# Patient Record
Sex: Female | Born: 1955 | Race: White | Hispanic: No | Marital: Single | Smoking: Former smoker
Health system: Southern US, Community
[De-identification: ages and names within clinical notes are randomized; demographics above are authoritative.]

## PROBLEM LIST (undated history)

## (undated) ENCOUNTER — Emergency Department: Discharge status: Absent without leave | Payer: Medicare Other

## (undated) DIAGNOSIS — I509 Heart failure, unspecified: Secondary | ICD-10-CM

## (undated) DIAGNOSIS — I10 Essential (primary) hypertension: Secondary | ICD-10-CM

## (undated) DIAGNOSIS — E119 Type 2 diabetes mellitus without complications: Secondary | ICD-10-CM

## (undated) DIAGNOSIS — R600 Localized edema: Secondary | ICD-10-CM

## (undated) DIAGNOSIS — E785 Hyperlipidemia, unspecified: Secondary | ICD-10-CM

## (undated) DIAGNOSIS — R0602 Shortness of breath: Secondary | ICD-10-CM

## (undated) DIAGNOSIS — I2510 Atherosclerotic heart disease of native coronary artery without angina pectoris: Secondary | ICD-10-CM

## (undated) HISTORY — PX: CERVICAL FUSION: SHX112

## (undated) HISTORY — PX: CORONARY ANGIOPLASTY WITH STENT PLACEMENT: SHX49

## (undated) HISTORY — DX: Localized edema: R60.0

## (undated) HISTORY — DX: Shortness of breath: R06.02

## (undated) HISTORY — PX: APPENDECTOMY: SHX54

## (undated) HISTORY — PX: HERNIA REPAIR: SHX51

## (undated) HISTORY — DX: Atherosclerotic heart disease of native coronary artery without angina pectoris: I25.10

---

## 2005-09-22 ENCOUNTER — Emergency Department: Payer: Self-pay | Admitting: Emergency Medicine

## 2006-03-02 ENCOUNTER — Ambulatory Visit: Payer: Self-pay | Admitting: Cardiovascular Disease

## 2008-05-26 ENCOUNTER — Emergency Department: Payer: Self-pay | Admitting: Emergency Medicine

## 2009-06-17 ENCOUNTER — Emergency Department: Payer: Self-pay | Admitting: Emergency Medicine

## 2010-04-19 ENCOUNTER — Emergency Department: Payer: Self-pay | Admitting: Emergency Medicine

## 2010-09-23 ENCOUNTER — Emergency Department: Payer: Self-pay | Admitting: Emergency Medicine

## 2011-07-17 ENCOUNTER — Inpatient Hospital Stay: Payer: Self-pay | Admitting: Internal Medicine

## 2011-08-07 ENCOUNTER — Ambulatory Visit: Payer: Self-pay

## 2012-02-22 ENCOUNTER — Emergency Department: Payer: Self-pay | Admitting: Emergency Medicine

## 2019-09-03 ENCOUNTER — Other Ambulatory Visit: Payer: Self-pay

## 2019-09-03 ENCOUNTER — Emergency Department
Admission: EM | Admit: 2019-09-03 | Discharge: 2019-09-03 | Disposition: A | Discharge status: Home / Self Care | Payer: Medicaid Other | Attending: Emergency Medicine | Admitting: Emergency Medicine

## 2019-09-03 ENCOUNTER — Emergency Department: Payer: Medicaid Other

## 2019-09-03 DIAGNOSIS — E119 Type 2 diabetes mellitus without complications: Secondary | ICD-10-CM | POA: Insufficient documentation

## 2019-09-03 DIAGNOSIS — Z955 Presence of coronary angioplasty implant and graft: Secondary | ICD-10-CM | POA: Insufficient documentation

## 2019-09-03 DIAGNOSIS — I110 Hypertensive heart disease with heart failure: Secondary | ICD-10-CM | POA: Insufficient documentation

## 2019-09-03 DIAGNOSIS — Z87891 Personal history of nicotine dependence: Secondary | ICD-10-CM | POA: Insufficient documentation

## 2019-09-03 DIAGNOSIS — M4807 Spinal stenosis, lumbosacral region: Secondary | ICD-10-CM | POA: Insufficient documentation

## 2019-09-03 DIAGNOSIS — W19XXXD Unspecified fall, subsequent encounter: Secondary | ICD-10-CM | POA: Insufficient documentation

## 2019-09-03 DIAGNOSIS — I509 Heart failure, unspecified: Secondary | ICD-10-CM | POA: Insufficient documentation

## 2019-09-03 HISTORY — DX: Heart failure, unspecified: I50.9

## 2019-09-03 HISTORY — DX: Type 2 diabetes mellitus without complications: E11.9

## 2019-09-03 HISTORY — DX: Essential (primary) hypertension: I10

## 2019-09-03 HISTORY — DX: Hyperlipidemia, unspecified: E78.5

## 2019-09-03 MED ORDER — OXYCODONE-ACETAMINOPHEN 5-325 MG PO TABS: 1.0000 | ORAL_TABLET | ORAL | 0 refills | Status: AC | PRN

## 2019-09-03 MED ORDER — ONDANSETRON 8 MG PO TBDP
8.0000 mg | ORAL_TABLET | Freq: Once | ORAL | Status: AC
  Administered 2019-09-03: 14:00:00 8 mg via ORAL
  Filled 2019-09-03: qty 1

## 2019-09-03 MED ORDER — MORPHINE SULFATE (PF) 4 MG/ML IV SOLN
4.0000 mg | Freq: Once | INTRAVENOUS | Status: AC
  Administered 2019-09-03: 14:00:00 4 mg via INTRAMUSCULAR
  Filled 2019-09-03: qty 1

## 2019-09-03 MED ORDER — PREDNISONE 10 MG (21) PO TBPK: ORAL_TABLET | ORAL | 0 refills | Status: DC

## 2019-09-03 NOTE — ED Provider Notes (Signed)
[facility] Emergency Department Provider Note  ____________________________________________   First MD Initiated Contact with Patient 09/03/19 1305     (approximate)  I have reviewed the triage vital signs and the nursing notes.   HISTORY  Chief Complaint Back Pain    HPI Crystal Moses is a 64 y.o. female presents emergency department complaining of severe lower back pain.  Patient fell approximately 4 to 5 weeks ago.  She was evaluated at [facility].  She states he did not tell her that she had a fracture but said she had sciatica.  The pain in the lower back radiates into both upper thighs.  She has not lost control of her bowel or bladder.  She states her legs feel weak.  Her son became very concerned and want her to come the emergency department today.    Past Medical History:  Diagnosis Date  . CHF (congestive heart failure) ([place])   . Diabetes mellitus without complication ([place])   . Hyperlipidemia   . Hypertension     There are no problems to display for this patient.   Past Surgical History:  Procedure Laterality Date  . APPENDECTOMY    . CERVICAL FUSION    . CORONARY ANGIOPLASTY WITH STENT PLACEMENT    . HERNIA REPAIR      Prior to Admission medications   Medication Sig Start Date End Date Taking? Authorizing Provider  oxyCODONE-acetaminophen (PERCOCET) 5-325 MG tablet Take 1 tablet by mouth every 4 (four) hours as needed for severe pain. 09/03/19 09/02/20  Carnelia Oscar, Linden Dolin, PA-C  predniSONE (STERAPRED UNI-PAK 21 TAB) 10 MG (21) TBPK tablet Take 6 pills on day one then decrease by 1 pill each day 09/03/19   Versie Starks, PA-C    Allergies Patient has no allergy information on record.  No family history on file.  Social History Social History   Tobacco Use  . Smoking status: Former Research scientist (life sciences)  . Smokeless tobacco: Never Used  Substance Use Topics  . Alcohol use: Yes  . Drug use: Not Currently    Review of  Systems  Constitutional: No fever/chills Eyes: No visual changes. ENT: No sore throat. Respiratory: Denies cough Cardiovascular: Denies chest pain Gastrointestinal: Denies abdominal pain Genitourinary: Negative for dysuria. Musculoskeletal: Positive for back pain. Skin: Negative for rash. Psychiatric: no mood changes,     ____________________________________________   PHYSICAL EXAM:  VITAL SIGNS: ED Triage Vitals  Enc Vitals Group     BP 09/03/19 1210 140/82     Pulse Rate 09/03/19 1210 70     Resp 09/03/19 1210 18     Temp 09/03/19 1210 98.2 F (36.8 C)     Temp Source 09/03/19 1210 Oral     SpO2 09/03/19 1210 98 %     Weight 09/03/19 1211 180 lb (81.6 kg)     Height 09/03/19 1211 5\' 2"  (1.575 m)     Head Circumference --      Peak Flow --      Pain Score 09/03/19 1211 0     Pain Loc --      Pain Edu? --      Excl. in [place]? --     Constitutional: Alert and oriented. Well appearing and in no acute distress. Eyes: Conjunctivae are normal.  Head: Atraumatic. Nose: No congestion/rhinnorhea. Mouth/Throat: Mucous membranes are moist.   Neck:  supple no lymphadenopathy noted Cardiovascular: Normal rate, regular rhythm. Respiratory: Normal respiratory effort.  No retractions,  GU: deferred Musculoskeletal:  Lumbar spine to sacrum area is very tender to palpation, hips are nontender, neurovascular is intact  neurologic:  Normal speech and language.  Skin:  Skin is warm, dry and intact. No rash noted. Psychiatric: Mood and affect are normal. Speech and behavior are normal.  ____________________________________________   LABS (all labs ordered are listed, but only abnormal results are displayed)  Labs Reviewed - No data to display ____________________________________________   ____________________________________________  RADIOLOGY  CT of the lumbar spine  ____________________________________________   PROCEDURES  Procedure(s) performed: Morphine 4 mg IM,  Zofran 4 mg ODT   Procedures    ____________________________________________   INITIAL IMPRESSION / ASSESSMENT AND PLAN / ED COURSE  Pertinent labs & imaging results that were available during my care of the patient were reviewed by me and considered in my medical decision making (see chart for details).   Patient 64 year old female presents emergency department complaint back pain.  See HPI  Physical exam shows patient to appear well.  Vitals are normal and stable.  Lumbar spine is tender.  DDx: Lumbar contusion, compression fracture, radicular pain  Morphine 4 mg IM, Zofran 4 mg ODT   CT lumbar spine shows spinal stenosis in multiple levels.  No compression fractures noted.  Explained the findings to the patient.  She was given a prescription for Sterapred and Percocet.  She is to follow-up with [facility] neurosurgery.  She is to call them for an appointment.  She states that she is able to ambulate only with a walker.  She was discharged in stable condition.  Crystal Moses was evaluated in Emergency Department on 09/03/2019 for the symptoms described in the history of present illness. She was evaluated in the context of the global COVID-19 pandemic, which necessitated consideration that the patient might be at risk for infection with the SARS-CoV-2 virus that causes COVID-19. Institutional protocols and algorithms that pertain to the evaluation of patients at risk for COVID-19 are in a state of rapid change based on information released by regulatory bodies including the CDC and federal and state organizations. These policies and algorithms were followed during the patient's care in the ED.   As part of my medical decision making, I reviewed the following data within the electronic MEDICAL RECORD NUMBER Nursing notes reviewed and incorporated, Old chart reviewed, Radiograph reviewed see below, Notes from prior ED visits and [place] Controlled Substance  Database  ____________________________________________   FINAL CLINICAL IMPRESSION(S) / ED DIAGNOSES  Final diagnoses:  Spinal stenosis of lumbosacral region  Fall, subsequent encounter      NEW MEDICATIONS STARTED DURING THIS VISIT:  New Prescriptions   OXYCODONE-ACETAMINOPHEN (PERCOCET) 5-325 MG TABLET    Take 1 tablet by mouth every 4 (four) hours as needed for severe pain.   PREDNISONE (STERAPRED UNI-PAK 21 TAB) 10 MG (21) TBPK TABLET    Take 6 pills on day one then decrease by 1 pill each day     Note:  This document was prepared using Dragon voice recognition software and may include unintentional dictation errors.    Faythe Ghee, PA-C 09/03/19 1435    Phineas Semen, MD 09/03/19 [phone]

## 2019-09-03 NOTE — ED Notes (Signed)
First Nurse Note: Pt to ED via [facility] from home for back pain x 5 weeks. Pt is in NAD. VSS per EMS

## 2019-09-03 NOTE — ED Triage Notes (Signed)
Pt states she fell 3 weeks ago and was seen at [facility] and dx with sciatica. Pt states her back pain is so bad she has difficulty walking, pt has not followed up with an orthopedist.

## 2019-09-03 NOTE — Discharge Instructions (Signed)
Follow-up with Dr. Adriana Simas.  Please call for an appointment.  Use medications as prescribed.  Return if worsening.

## 2020-11-02 ENCOUNTER — Inpatient Hospital Stay
Admission: EM | Admit: 2020-11-02 | Discharge: 2020-11-06 | DRG: 535 | Disposition: A | Discharge status: Skilled Nursing Facility | Payer: Medicare HMO | Attending: Family Medicine | Admitting: Family Medicine

## 2020-11-02 ENCOUNTER — Other Ambulatory Visit: Payer: Self-pay

## 2020-11-02 ENCOUNTER — Observation Stay: Payer: Medicare HMO

## 2020-11-02 ENCOUNTER — Emergency Department: Payer: Medicare HMO

## 2020-11-02 ENCOUNTER — Encounter: Payer: Self-pay | Admitting: Emergency Medicine

## 2020-11-02 DIAGNOSIS — I110 Hypertensive heart disease with heart failure: Secondary | ICD-10-CM | POA: Diagnosis present

## 2020-11-02 DIAGNOSIS — Z6823 Body mass index (BMI) 23.0-23.9, adult: Secondary | ICD-10-CM

## 2020-11-02 DIAGNOSIS — F419 Anxiety disorder, unspecified: Secondary | ICD-10-CM | POA: Diagnosis present

## 2020-11-02 DIAGNOSIS — Z20822 Contact with and (suspected) exposure to covid-19: Secondary | ICD-10-CM | POA: Diagnosis present

## 2020-11-02 DIAGNOSIS — W19XXXA Unspecified fall, initial encounter: Secondary | ICD-10-CM

## 2020-11-02 DIAGNOSIS — Z23 Encounter for immunization: Secondary | ICD-10-CM

## 2020-11-02 DIAGNOSIS — E876 Hypokalemia: Secondary | ICD-10-CM | POA: Diagnosis not present

## 2020-11-02 DIAGNOSIS — Z981 Arthrodesis status: Secondary | ICD-10-CM

## 2020-11-02 DIAGNOSIS — E785 Hyperlipidemia, unspecified: Secondary | ICD-10-CM | POA: Diagnosis present

## 2020-11-02 DIAGNOSIS — W1839XA Other fall on same level, initial encounter: Secondary | ICD-10-CM | POA: Diagnosis present

## 2020-11-02 DIAGNOSIS — Z87891 Personal history of nicotine dependence: Secondary | ICD-10-CM

## 2020-11-02 DIAGNOSIS — Z7984 Long term (current) use of oral hypoglycemic drugs: Secondary | ICD-10-CM

## 2020-11-02 DIAGNOSIS — E43 Unspecified severe protein-calorie malnutrition: Secondary | ICD-10-CM | POA: Diagnosis present

## 2020-11-02 DIAGNOSIS — M25552 Pain in left hip: Secondary | ICD-10-CM | POA: Diagnosis not present

## 2020-11-02 DIAGNOSIS — I2510 Atherosclerotic heart disease of native coronary artery without angina pectoris: Secondary | ICD-10-CM | POA: Diagnosis present

## 2020-11-02 DIAGNOSIS — D539 Nutritional anemia, unspecified: Secondary | ICD-10-CM | POA: Diagnosis present

## 2020-11-02 DIAGNOSIS — F32A Depression, unspecified: Secondary | ICD-10-CM | POA: Diagnosis present

## 2020-11-02 DIAGNOSIS — K2270 Barrett's esophagus without dysplasia: Secondary | ICD-10-CM | POA: Diagnosis present

## 2020-11-02 DIAGNOSIS — E1140 Type 2 diabetes mellitus with diabetic neuropathy, unspecified: Secondary | ICD-10-CM | POA: Diagnosis present

## 2020-11-02 DIAGNOSIS — K449 Diaphragmatic hernia without obstruction or gangrene: Secondary | ICD-10-CM | POA: Diagnosis present

## 2020-11-02 DIAGNOSIS — Z79899 Other long term (current) drug therapy: Secondary | ICD-10-CM

## 2020-11-02 DIAGNOSIS — I5032 Chronic diastolic (congestive) heart failure: Secondary | ICD-10-CM | POA: Diagnosis present

## 2020-11-02 DIAGNOSIS — S72115A Nondisplaced fracture of greater trochanter of left femur, initial encounter for closed fracture: Principal | ICD-10-CM | POA: Diagnosis present

## 2020-11-02 DIAGNOSIS — S72113A Displaced fracture of greater trochanter of unspecified femur, initial encounter for closed fracture: Secondary | ICD-10-CM | POA: Diagnosis present

## 2020-11-02 DIAGNOSIS — S72002A Fracture of unspecified part of neck of left femur, initial encounter for closed fracture: Secondary | ICD-10-CM | POA: Diagnosis not present

## 2020-11-02 DIAGNOSIS — Z8249 Family history of ischemic heart disease and other diseases of the circulatory system: Secondary | ICD-10-CM

## 2020-11-02 DIAGNOSIS — G4733 Obstructive sleep apnea (adult) (pediatric): Secondary | ICD-10-CM | POA: Diagnosis present

## 2020-11-02 DIAGNOSIS — Z955 Presence of coronary angioplasty implant and graft: Secondary | ICD-10-CM

## 2020-11-02 LAB — CBC WITH DIFFERENTIAL/PLATELET
Abs Immature Granulocytes: 0.03 10*3/uL (ref 0.00–0.07)
Basophils Absolute: 0.1 10*3/uL (ref 0.0–0.1)
Basophils Relative: 1 %
Eosinophils Absolute: 0.2 10*3/uL (ref 0.0–0.5)
Eosinophils Relative: 2 %
HCT: 27.9 % — ABNORMAL LOW (ref 36.0–46.0)
Hemoglobin: 9.2 g/dL — ABNORMAL LOW (ref 12.0–15.0)
Immature Granulocytes: 0 %
Lymphocytes Relative: 20 %
Lymphs Abs: 1.6 10*3/uL (ref 0.7–4.0)
MCH: 34.2 pg — ABNORMAL HIGH (ref 26.0–34.0)
MCHC: 33 g/dL (ref 30.0–36.0)
MCV: 103.7 fL — ABNORMAL HIGH (ref 80.0–100.0)
Monocytes Absolute: 0.6 10*3/uL (ref 0.1–1.0)
Monocytes Relative: 8 %
Neutro Abs: 5.5 10*3/uL (ref 1.7–7.7)
Neutrophils Relative %: 69 %
Platelets: 239 10*3/uL (ref 150–400)
RBC: 2.69 MIL/uL — ABNORMAL LOW (ref 3.87–5.11)
RDW: 13.5 % (ref 11.5–15.5)
WBC: 8 10*3/uL (ref 4.0–10.5)
nRBC: 0 % (ref 0.0–0.2)

## 2020-11-02 LAB — GLUCOSE, CAPILLARY: Glucose-Capillary: 107 mg/dL — ABNORMAL HIGH (ref 70–99)

## 2020-11-02 LAB — BASIC METABOLIC PANEL
Anion gap: 8 (ref 5–15)
BUN: 10 mg/dL (ref 8–23)
CO2: 24 mmol/L (ref 22–32)
Calcium: 7.5 mg/dL — ABNORMAL LOW (ref 8.9–10.3)
Chloride: 107 mmol/L (ref 98–111)
Creatinine, Ser: 0.99 mg/dL (ref 0.44–1.00)
GFR, Estimated: >60 mL/min (ref >60)
Glucose, Bld: 105 mg/dL — ABNORMAL HIGH (ref 70–99)
Potassium: 4 mmol/L (ref 3.5–5.1)
Sodium: 139 mmol/L (ref 135–145)

## 2020-11-02 LAB — RESP PANEL BY RT-PCR (FLU A&B, COVID) ARPGX2
Influenza A by PCR: NEGATIVE
Influenza B by PCR: NEGATIVE
SARS Coronavirus 2 by RT PCR: NEGATIVE

## 2020-11-02 MED ORDER — HYDROMORPHONE HCL 1 MG/ML IJ SOLN
0.5000 mg | INTRAMUSCULAR | Status: DC | PRN
  Administered 2020-11-02 – 2020-11-03 (×3): 0.5 mg via INTRAVENOUS
  Filled 2020-11-02 (×3): qty 1

## 2020-11-02 MED ORDER — OXYCODONE HCL 5 MG PO TABS
5.0000 mg | ORAL_TABLET | ORAL | Status: DC | PRN
  Administered 2020-11-03 – 2020-11-05 (×6): 5 mg via ORAL
  Filled 2020-11-02 (×6): qty 1

## 2020-11-02 MED ORDER — SENNA 8.6 MG PO TABS
1.0000 | ORAL_TABLET | Freq: Every day | ORAL | Status: DC
  Administered 2020-11-02 – 2020-11-04 (×3): 8.6 mg via ORAL
  Filled 2020-11-02 (×3): qty 1

## 2020-11-02 MED ORDER — FOLIC ACID 1 MG PO TABS
1.0000 mg | ORAL_TABLET | Freq: Every day | ORAL | Status: DC
  Administered 2020-11-03 – 2020-11-06 (×4): 1 mg via ORAL
  Filled 2020-11-02 (×4): qty 1

## 2020-11-02 MED ORDER — TETANUS-DIPHTH-ACELL PERTUSSIS 5-2.5-18.5 LF-MCG/0.5 IM SUSY
0.5000 mL | PREFILLED_SYRINGE | Freq: Once | INTRAMUSCULAR | Status: AC
  Administered 2020-11-02: 0.5 mL via INTRAMUSCULAR
  Filled 2020-11-02: qty 0.5

## 2020-11-02 MED ORDER — MORPHINE SULFATE (PF) 4 MG/ML IV SOLN
4.0000 mg | INTRAVENOUS | Status: DC | PRN
  Administered 2020-11-02: 4 mg via INTRAVENOUS
  Filled 2020-11-02: qty 1

## 2020-11-02 MED ORDER — SERTRALINE HCL 50 MG PO TABS
150.0000 mg | ORAL_TABLET | Freq: Every day | ORAL | Status: DC
  Administered 2020-11-03 – 2020-11-04 (×2): 150 mg via ORAL
  Filled 2020-11-02 (×2): qty 3

## 2020-11-02 MED ORDER — ENOXAPARIN SODIUM 40 MG/0.4ML ~~LOC~~ SOLN
40.0000 mg | SUBCUTANEOUS | Status: DC
  Administered 2020-11-02 – 2020-11-04 (×3): 40 mg via SUBCUTANEOUS
  Filled 2020-11-02 (×3): qty 0.4

## 2020-11-02 MED ORDER — PANTOPRAZOLE SODIUM 40 MG PO TBEC
40.0000 mg | DELAYED_RELEASE_TABLET | Freq: Two times a day (BID) | ORAL | Status: DC
  Administered 2020-11-02 – 2020-11-06 (×7): 40 mg via ORAL
  Filled 2020-11-02 (×7): qty 1

## 2020-11-02 MED ORDER — INSULIN ASPART 100 UNIT/ML ~~LOC~~ SOLN: 0.0000 [IU] | Freq: Three times a day (TID) | SUBCUTANEOUS | Status: DC

## 2020-11-02 MED ORDER — ATORVASTATIN CALCIUM 10 MG PO TABS
80.0000 mg | ORAL_TABLET | Freq: Every day | ORAL | Status: DC
  Administered 2020-11-03 – 2020-11-06 (×4): 80 mg via ORAL
  Filled 2020-11-02 (×3): qty 4
  Filled 2020-11-02: qty 8

## 2020-11-02 MED ORDER — THIAMINE HCL 100 MG PO TABS
100.0000 mg | ORAL_TABLET | Freq: Every day | ORAL | Status: DC
  Administered 2020-11-03 – 2020-11-06 (×4): 100 mg via ORAL
  Filled 2020-11-02 (×5): qty 1

## 2020-11-02 MED ORDER — MIRTAZAPINE 7.5 MG PO TABS
7.5000 mg | ORAL_TABLET | Freq: Every day | ORAL | Status: DC
  Administered 2020-11-02 – 2020-11-04 (×3): 7.5 mg via ORAL
  Filled 2020-11-02 (×3): qty 1

## 2020-11-02 MED ORDER — GABAPENTIN 100 MG PO CAPS
200.0000 mg | ORAL_CAPSULE | Freq: Three times a day (TID) | ORAL | Status: DC
  Administered 2020-11-02 – 2020-11-06 (×10): 200 mg via ORAL
  Filled 2020-11-02 (×10): qty 2

## 2020-11-02 MED ORDER — MORPHINE SULFATE (PF) 4 MG/ML IV SOLN
4.0000 mg | Freq: Once | INTRAVENOUS | Status: AC
  Administered 2020-11-02: 4 mg via INTRAVENOUS
  Filled 2020-11-02: qty 1

## 2020-11-02 NOTE — ED Triage Notes (Signed)
Pt to ED via [facility], Per EMS pt has had 2 falls today. Pt is now having left hip pain, not able to bear much weight on her hip. Pt states that she is unsure why she fell. Pt states that she did hit her hear. Pt did not have LOC. Pt is not sure if she is on blood thinners. Pt is currently A & O x 4.

## 2020-11-02 NOTE — ED Provider Notes (Signed)
[facility] Emergency Department Provider Note  ____________________________________________   Event Date/Time   First MD Initiated Contact with Patient 11/02/20 1504     (approximate)  I have reviewed the triage vital signs and the nursing notes.   HISTORY  Chief Complaint Fall and Hip Pain (Left/)    HPI Crystal Moses is a 65 y.o. female with CHF, diabetes, hypertension, hyperlipidemia who comes in for falls.  Patient had 2 falls today with recurrent left hip pain.  Patient reports having 2 falls today.  She is states that her legs just gave out.  She states that she feels very weak, constant, nothing makes it better, nothing makes it worse.  She states that she did hit her head but she is not sure if that was yesterday or today.  She is not on any blood thinners.  On review of records patient had admission to [facility] on 2/28.  Patient had melena and EGD was consistent with Barrett's esophagus and hiatal hernia.  There her hemoglobin was 8.5 and remained stable.  She also has had significant weight loss over the past 2 years that was worked up there but unclear exactly the cause.          Past Medical History:  Diagnosis Date  . CHF (congestive heart failure) ([facility])   . Diabetes mellitus without complication ([facility])   . Hyperlipidemia   . Hypertension     There are no problems to display for this patient.   Past Surgical History:  Procedure Laterality Date  . APPENDECTOMY    . CERVICAL FUSION    . CORONARY ANGIOPLASTY WITH STENT PLACEMENT    . HERNIA REPAIR      Prior to Admission medications   Medication Sig Start Date End Date Taking? Authorizing Provider  predniSONE (STERAPRED UNI-PAK 21 TAB) 10 MG (21) TBPK tablet Take 6 pills on day one then decrease by 1 pill each day 09/03/19   Faythe Ghee, PA-C    Allergies Patient has no known allergies.  No family history on file.  Social History Social History   Tobacco Use  . Smoking  status: Former Games developer  . Smokeless tobacco: Never Used  Substance Use Topics  . Alcohol use: Yes  . Drug use: Not Currently      Review of Systems Constitutional: No fever/chills, falls Eyes: No visual changes. ENT: No sore throat. Cardiovascular: Denies chest pain. Respiratory: Denies shortness of breath. Gastrointestinal: No abdominal pain.  No nausea, no vomiting.  No diarrhea.  No constipation. Genitourinary: Negative for dysuria. Musculoskeletal: Negative for back pain.  Positive left hip pain Skin: Negative for rash. Neurological: Negative for headaches, focal weakness or numbness. All other ROS negative ____________________________________________   PHYSICAL EXAM:  VITAL SIGNS: ED Triage Vitals  Enc Vitals Group     BP 11/02/20 1450 (!) 144/83     Pulse Rate 11/02/20 1450 73     Resp 11/02/20 1450 16     Temp 11/02/20 1450 98 F (36.7 C)     Temp Source 11/02/20 1450 Oral     SpO2 11/02/20 1450 100 %     Weight 11/02/20 1453 132 lb (59.9 kg)     Height 11/02/20 1453 5\' 3"  (1.6 m)     Head Circumference --      Peak Flow --      Pain Score 11/02/20 1452 8     Pain Loc --      Pain Edu? --  Excl. in [place]? --     Constitutional: Alert and oriented. GCS 15  Eyes: Conjunctivae are normal. EOMI. Head: Atraumatic. Nose: No congestion/rhinnorhea. Mouth/Throat: Mucous membranes are moist.   Neck: No stridor. Trachea Midline. FROM.  No C-spine tenderness Cardiovascular: Normal rate, regular rhythm. Grossly normal heart sounds.  Good peripheral circulation. No chest wall tenderness Respiratory: Normal respiratory effort.  No retractions. Lungs CTAB. Gastrointestinal: Soft and nontender. No distention. No abdominal bruits.  Musculoskeletal: Multiple scratches all over her body and scattered bruising  RUE: No point tenderness, deformity or other signs of injury. Radial pulse intact. Neuro intact. Full ROM in joint. LUE: No point tenderness, deformity or other  signs of injury. Radial pulse intact. Neuro intact. Full ROM in joints RLE: No point tenderness, deformity or other signs of injury. DP pulse intact. Neuro intact. Full ROM in joints. LLE: Pain at the hip.  Difficulty lifting the left leg secondary to pain.  2+ distal pulse.  Able to wiggle her toes Neurologic:  Normal speech and language. No gross focal neurologic deficits are appreciated.  Skin:  Skin is warm, dry and intact. No rash noted. Psychiatric: Mood and affect are normal. Speech and behavior are normal. GU: Deferred   ____________________________________________   LABS (all labs ordered are listed, but only abnormal results are displayed)  Labs Reviewed  BASIC METABOLIC PANEL - Abnormal; Notable for the following components:      Result Value   Glucose, Bld 105 (*)    Calcium 7.5 (*)    All other components within normal limits  CBC WITH DIFFERENTIAL/PLATELET - Abnormal; Notable for the following components:   RBC 2.69 (*)    Hemoglobin 9.2 (*)    HCT 27.9 (*)    MCV 103.7 (*)    MCH 34.2 (*)    All other components within normal limits   ____________________________________________   ED ECG REPORT I, Concha SeMary E Nickolas Chalfin, the attending physician, personally viewed and interpreted this ECG.  Normal sinus rate of 73, no ST elevation, T wave inversion in lead III, right bundle branch block ____________________________________________  RADIOLOGY I, Concha SeMary E Kelsay Haggard, personally viewed and evaluated these images (plain radiographs) as part of my medical decision making, as well as reviewing the written report by the radiologist.  ED MD interpretation: No obvious fracture on x-ray  Official radiology report(s): DG Chest 1 View  Result Date: 11/02/2020 CLINICAL DATA:  LEFT hip pain, fell twice today EXAM: CHEST  1 VIEW COMPARISON:  10/16/2020 FINDINGS: Normal heart size, mediastinal contours, and pulmonary vascularity. Atherosclerotic calcification aorta. Mild chronic  peribronchial thickening. Lungs clear. No pulmonary infiltrate, pleural effusion, or pneumothorax. Bones demineralized with BILATERAL chronic rotator cuff tears and evidence of prior cervical spine fusion. IMPRESSION: Bronchitic changes without infiltrate. Aortic Atherosclerosis (ICD10-I70.0). Electronically Signed   By: Ulyses SouthwardMark  Boles M.D.   On: 11/02/2020 16:10   CT Head Wo Contrast  Result Date: 11/02/2020 CLINICAL DATA:  Head trauma, minor (Age >= 65y) Fall, no loss of consciousness. EXAM: CT HEAD WITHOUT CONTRAST TECHNIQUE: Contiguous axial images were obtained from the base of the skull through the vertex without intravenous contrast. COMPARISON:  None. FINDINGS: Brain: Age related atrophy with mild chronic small vessel ischemia. No intracranial hemorrhage, mass effect, or midline shift. No hydrocephalus. The basilar cisterns are patent. No evidence of territorial infarct or acute ischemia. No extra-axial or intracranial fluid collection. Vascular: Atherosclerosis of skullbase vasculature without hyperdense vessel or abnormal calcification. Skull: No fracture or focal lesion. Sinuses/Orbits: Opacification of  right side of sphenoid sinus with mucosal thickening and fluid level. Remaining paranasal sinuses are clear. Mastoid air cells are well aerated. No acute orbital abnormality. Other: None. IMPRESSION: 1. No acute intracranial abnormality. No skull fracture. 2. Age related atrophy and chronic small vessel ischemia. 3. Sphenoid sinus disease with fluid level, may be acute. Electronically Signed   By: Narda Rutherford M.D.   On: 11/02/2020 17:29   CT Cervical Spine Wo Contrast  Result Date: 11/02/2020 CLINICAL DATA:  Neck trauma (Age >= 65y) Fall. EXAM: CT CERVICAL SPINE WITHOUT CONTRAST TECHNIQUE: Multidetector CT imaging of the cervical spine was performed without intravenous contrast. Multiplanar CT image reconstructions were also generated. COMPARISON:  None. FINDINGS: Alignment: Postsurgical  straightening. Grade anterolisthesis of C7 on T1 likely degenerative. No traumatic subluxation. Skull base and vertebrae: Anterior fusion C3 through C7 with interbody spacers. The hardware is intact. No acute fracture. The dens and skull base are intact. Soft tissues and spinal canal: No prevertebral fluid or swelling. No visible canal hematoma. Streak artifact from surgical hardware partially obscures soft tissue evaluation. Disc levels: Fusion C3-C4 through C6-C7 with interbody spacer. C7-T1 degenerative disc disease with grade 1 anterolisthesis and disc space narrowing. There is multilevel facet hypertrophy. Upper chest: No acute or unexpected findings. Other: Carotid calcifications. IMPRESSION: 1. No acute fracture or subluxation of the cervical spine. 2. Anterior fusion C3 through C7 with interbody spacers. 3. Degenerative disc disease and facet hypertrophy at C7-T1 with grade 1 anterolisthesis. Electronically Signed   By: Narda Rutherford M.D.   On: 11/02/2020 17:32   CT PELVIS WO CONTRAST  Result Date: 11/02/2020 CLINICAL DATA:  Left hip pain, difficulty bearing weight.  Fall. EXAM: CT PELVIS WITHOUT CONTRAST TECHNIQUE: Multidetector CT imaging of the pelvis was performed following the standard protocol without intravenous contrast. COMPARISON:  Radiographs 11/02/2020 FINDINGS: Urinary Tract:  Unremarkable Bowel:  Scattered sigmoid colon diverticula. Vascular/Lymphatic: Aortoiliac atherosclerotic vascular disease. No pathologic adenopathy identified. Reproductive:  Unremarkable Other: Ventral hernia mesh. Diffuse low-grade subcutaneous and mesenteric edema suggesting third spacing of fluid. No ascites. Musculoskeletal: Nondisplaced left femoral intertrochanteric fracture. Transverse greater trochanteric component. Markedly severe degenerative hip arthropathy on the left with bone-on-bone appearance and severe spurring. Moderate to prominent degenerative right hip arthropathy. Prominent spurring of both  hips. 1.1 cm of chronic grade 2 degenerative anterolisthesis at L5-S1 with solid interbody fusion at L5-S1. 5 mm degenerative retrolisthesis at L4-5. Lumbar spondylosis and degenerative disc disease with resulting foraminal impingement bilaterally at L3-4, L4-5, and L5-S1. IMPRESSION: 1. Nondisplaced left femoral intertrochanteric fracture. 2. 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 Moderate to prominent degenerative right hip arthropathy. 3. Lumbar spondylosis and degenerative disc disease causing foraminal impingement at L3-4, L4-5, and L5-S1. Chronic fused grade 2 anterolisthesis at L5-S1 with grade 1 retrolisthesis at L4-5. 4. Diffuse low-grade subcutaneous and mesenteric edema suggesting third spacing of fluid. 5. Scattered sigmoid colon diverticula. 6. Aortic atherosclerosis. Aortic Atherosclerosis (ICD10-I70.0). Electronically Signed   By: Gaylyn Rong M.D.   On: 11/02/2020 17:37   DG Hip Unilat W or Wo Pelvis 2-3 Views Left  Result Date: 11/02/2020 CLINICAL DATA:  Left hip pain after fall. EXAM: DG HIP (WITH OR WITHOUT PELVIS) 2-3V LEFT COMPARISON:  None. FINDINGS: No definite fracture or dislocation is noted. However, severe narrowing of the superior portion of the left hip joint is noted with osteophyte formation. IMPRESSION: Severe degenerative joint disease of the left hip. No definite acute abnormality is noted. Electronically Signed   By: Roque Lias  Jr M.D.   On: 11/02/2020 16:13    ____________________________________________   PROCEDURES  Procedure(s) performed (including Critical Care):  Procedures   ____________________________________________   INITIAL IMPRESSION / ASSESSMENT AND PLAN / ED COURSE   AVANTI JETTER was evaluated in Emergency Department on 11/02/2020 for the symptoms described in the history of present illness. She was evaluated in the context of the global COVID-19 pandemic, which necessitated consideration that the patient might be at risk for  infection with the SARS-CoV-2 virus that causes COVID-19. Institutional protocols and algorithms that pertain to the evaluation of patients at risk for COVID-19 are in a state of rapid change based on information released by regulatory bodies including the CDC and federal and state organizations. These policies and algorithms were followed during the patient's care in the ED.    Patient comes in with multiple falls secondary to generalized weakness.  No evidence of stroke upon examination but did hit her head.  Will need to get CT head evaluate for intercranial hemorrhage and CT cervical due to concern for possible distracting injury but no cervical spine tenderness at this time.  X-rays were ordered to evaluate for hip fracture the patient has been distal pulse.  She has no other extremity pain that is new to suggest other fractures.  No chest wall tenderness or abdominal tenderness to suggest intrathoracic or intra-abdominal injuries.  Patient was given 4 mg of IV morphine to help with pain  Patient's hemoglobin is stable and uptrending from prior admission and patient denies any melena at this time.  4:16 PM  degenerative joint disease but no definite fracture therefore we will proceed with CT head, CT cervical and CT head to further evaluate  CT head and neck are negative for acute findings.  CT hip was concerning for intertrochanter fracture.  Discussed case with Dr. Hyacinth Meeker who is not sure if it actually is a true intra-trochanter or just trochanter.  Regardless patient is not able to ambulate so we will admit to the hospital team.  Will get MRI to further evaluate for potential need for surgery and discussed the hospital team for admission    ____________________________________________   FINAL CLINICAL IMPRESSION(S) / ED DIAGNOSES   Final diagnoses:  Closed fracture of left hip, initial encounter (HCC)      MEDICATIONS GIVEN DURING THIS VISIT:  Medications  Tdap (BOOSTRIX) injection  0.5 mL (has no administration in time range)  morphine 4 MG/ML injection 4 mg (has no administration in time range)  morphine 4 MG/ML injection 4 mg (4 mg Intravenous Given 11/02/20 1507)     ED Discharge Orders    None       Note:  This document was prepared using Dragon voice recognition software and may include unintentional dictation errors.   Concha Se, MD 11/02/20 [phone]
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
## 2020-11-03 DIAGNOSIS — E1140 Type 2 diabetes mellitus with diabetic neuropathy, unspecified: Secondary | ICD-10-CM | POA: Diagnosis present

## 2020-11-03 DIAGNOSIS — S72115A Nondisplaced fracture of greater trochanter of left femur, initial encounter for closed fracture: Secondary | ICD-10-CM | POA: Diagnosis present

## 2020-11-03 DIAGNOSIS — I5032 Chronic diastolic (congestive) heart failure: Secondary | ICD-10-CM | POA: Diagnosis present

## 2020-11-03 DIAGNOSIS — K2270 Barrett's esophagus without dysplasia: Secondary | ICD-10-CM | POA: Diagnosis present

## 2020-11-03 DIAGNOSIS — Z6823 Body mass index (BMI) 23.0-23.9, adult: Secondary | ICD-10-CM | POA: Diagnosis not present

## 2020-11-03 DIAGNOSIS — Z955 Presence of coronary angioplasty implant and graft: Secondary | ICD-10-CM | POA: Diagnosis not present

## 2020-11-03 DIAGNOSIS — Z20822 Contact with and (suspected) exposure to covid-19: Secondary | ICD-10-CM | POA: Diagnosis present

## 2020-11-03 DIAGNOSIS — Z79899 Other long term (current) drug therapy: Secondary | ICD-10-CM | POA: Diagnosis not present

## 2020-11-03 DIAGNOSIS — D539 Nutritional anemia, unspecified: Secondary | ICD-10-CM | POA: Diagnosis present

## 2020-11-03 DIAGNOSIS — F32A Depression, unspecified: Secondary | ICD-10-CM | POA: Diagnosis present

## 2020-11-03 DIAGNOSIS — Z8249 Family history of ischemic heart disease and other diseases of the circulatory system: Secondary | ICD-10-CM | POA: Diagnosis not present

## 2020-11-03 DIAGNOSIS — Z7984 Long term (current) use of oral hypoglycemic drugs: Secondary | ICD-10-CM | POA: Diagnosis not present

## 2020-11-03 DIAGNOSIS — E785 Hyperlipidemia, unspecified: Secondary | ICD-10-CM | POA: Diagnosis present

## 2020-11-03 DIAGNOSIS — S72113A Displaced fracture of greater trochanter of unspecified femur, initial encounter for closed fracture: Secondary | ICD-10-CM | POA: Diagnosis present

## 2020-11-03 DIAGNOSIS — E43 Unspecified severe protein-calorie malnutrition: Secondary | ICD-10-CM | POA: Diagnosis present

## 2020-11-03 DIAGNOSIS — M25552 Pain in left hip: Secondary | ICD-10-CM | POA: Diagnosis present

## 2020-11-03 DIAGNOSIS — I110 Hypertensive heart disease with heart failure: Secondary | ICD-10-CM | POA: Diagnosis present

## 2020-11-03 DIAGNOSIS — Z23 Encounter for immunization: Secondary | ICD-10-CM | POA: Diagnosis not present

## 2020-11-03 DIAGNOSIS — F419 Anxiety disorder, unspecified: Secondary | ICD-10-CM | POA: Diagnosis present

## 2020-11-03 DIAGNOSIS — E876 Hypokalemia: Secondary | ICD-10-CM | POA: Diagnosis not present

## 2020-11-03 DIAGNOSIS — Z981 Arthrodesis status: Secondary | ICD-10-CM | POA: Diagnosis not present

## 2020-11-03 DIAGNOSIS — G4733 Obstructive sleep apnea (adult) (pediatric): Secondary | ICD-10-CM | POA: Diagnosis present

## 2020-11-03 DIAGNOSIS — K449 Diaphragmatic hernia without obstruction or gangrene: Secondary | ICD-10-CM | POA: Diagnosis present

## 2020-11-03 DIAGNOSIS — W1839XA Other fall on same level, initial encounter: Secondary | ICD-10-CM | POA: Diagnosis present

## 2020-11-03 DIAGNOSIS — Z87891 Personal history of nicotine dependence: Secondary | ICD-10-CM | POA: Diagnosis not present

## 2020-11-03 DIAGNOSIS — I2510 Atherosclerotic heart disease of native coronary artery without angina pectoris: Secondary | ICD-10-CM | POA: Diagnosis present

## 2020-11-03 LAB — CBC WITH DIFFERENTIAL/PLATELET
Abs Immature Granulocytes: 0.02 10*3/uL (ref 0.00–0.07)
Basophils Absolute: 0.1 10*3/uL (ref 0.0–0.1)
Basophils Relative: 1 %
Eosinophils Absolute: 0 10*3/uL (ref 0.0–0.5)
Eosinophils Relative: 0 %
HCT: 24.6 % — ABNORMAL LOW (ref 36.0–46.0)
Hemoglobin: 8.1 g/dL — ABNORMAL LOW (ref 12.0–15.0)
Immature Granulocytes: 0 %
Lymphocytes Relative: 12 %
Lymphs Abs: 1.2 10*3/uL (ref 0.7–4.0)
MCH: 34 pg (ref 26.0–34.0)
MCHC: 32.9 g/dL (ref 30.0–36.0)
MCV: 103.4 fL — ABNORMAL HIGH (ref 80.0–100.0)
Monocytes Absolute: 0.5 10*3/uL (ref 0.1–1.0)
Monocytes Relative: 5 %
Neutro Abs: 8.2 10*3/uL — ABNORMAL HIGH (ref 1.7–7.7)
Neutrophils Relative %: 82 %
Platelets: 199 10*3/uL (ref 150–400)
RBC: 2.38 MIL/uL — ABNORMAL LOW (ref 3.87–5.11)
RDW: 13.3 % (ref 11.5–15.5)
WBC: 9.9 10*3/uL (ref 4.0–10.5)
nRBC: 0 % (ref 0.0–0.2)

## 2020-11-03 LAB — COMPREHENSIVE METABOLIC PANEL
ALT: 26 U/L (ref 0–44)
AST: 38 U/L (ref 15–41)
Albumin: 2 g/dL — ABNORMAL LOW (ref 3.5–5.0)
Alkaline Phosphatase: 55 U/L (ref 38–126)
Anion gap: 6 (ref 5–15)
BUN: 10 mg/dL (ref 8–23)
CO2: 26 mmol/L (ref 22–32)
Calcium: 7.5 mg/dL — ABNORMAL LOW (ref 8.9–10.3)
Chloride: 108 mmol/L (ref 98–111)
Creatinine, Ser: 1.06 mg/dL — ABNORMAL HIGH (ref 0.44–1.00)
GFR, Estimated: 58 mL/min — ABNORMAL LOW (ref >60)
Glucose, Bld: 120 mg/dL — ABNORMAL HIGH (ref 70–99)
Potassium: 3.8 mmol/L (ref 3.5–5.1)
Sodium: 140 mmol/L (ref 135–145)
Total Bilirubin: 0.8 mg/dL (ref 0.3–1.2)
Total Protein: 4.9 g/dL — ABNORMAL LOW (ref 6.5–8.1)

## 2020-11-03 LAB — GLUCOSE, CAPILLARY
Glucose-Capillary: 98 mg/dL (ref 70–99)
Glucose-Capillary: 82 mg/dL (ref 70–99)
Glucose-Capillary: 91 mg/dL (ref 70–99)
Glucose-Capillary: 75 mg/dL (ref 70–99)

## 2020-11-03 LAB — MAGNESIUM: Magnesium: 1.6 mg/dL — ABNORMAL LOW (ref 1.7–2.4)

## 2020-11-03 LAB — PHOSPHORUS: Phosphorus: 4.7 mg/dL — ABNORMAL HIGH (ref 2.5–4.6)

## 2020-11-03 LAB — HEMOGLOBIN A1C
Hgb A1c MFr Bld: 5.3 % (ref 4.8–5.6)
Mean Plasma Glucose: 105.41 mg/dL

## 2020-11-03 MED ORDER — ACETAMINOPHEN 500 MG PO TABS
1000.0000 mg | ORAL_TABLET | Freq: Three times a day (TID) | ORAL | Status: DC
  Administered 2020-11-03 – 2020-11-06 (×8): 1000 mg via ORAL
  Filled 2020-11-03 (×8): qty 2

## 2020-11-03 MED ORDER — ENSURE SURGERY PO LIQD
237.0000 mL | Freq: Two times a day (BID) | ORAL | Status: DC
  Administered 2020-11-03 – 2020-11-05 (×3): 237 mL via ORAL
  Filled 2020-11-03: qty 237

## 2020-11-03 MED ORDER — TRAZODONE HCL 50 MG PO TABS: 150.0000 mg | ORAL_TABLET | Freq: Every evening | ORAL | Status: DC | PRN

## 2020-11-03 MED ORDER — METHOCARBAMOL 500 MG PO TABS
500.0000 mg | ORAL_TABLET | Freq: Three times a day (TID) | ORAL | Status: DC | PRN
  Administered 2020-11-03 – 2020-11-04 (×3): 500 mg via ORAL
  Filled 2020-11-03 (×3): qty 1

## 2020-11-03 MED ORDER — METHOCARBAMOL 1000 MG/10ML IJ SOLN
500.0000 mg | Freq: Three times a day (TID) | INTRAVENOUS | Status: DC | PRN
  Filled 2020-11-03: qty 5

## 2020-11-03 MED ORDER — MAGNESIUM SULFATE 2 GM/50ML IV SOLN
2.0000 g | Freq: Once | INTRAVENOUS | Status: AC
  Administered 2020-11-03: 2 g via INTRAVENOUS
  Filled 2020-11-03: qty 50

## 2020-11-03 MED ORDER — ADULT MULTIVITAMIN W/MINERALS CH
1.0000 | ORAL_TABLET | Freq: Every day | ORAL | Status: DC
  Administered 2020-11-03 – 2020-11-06 (×4): 1 via ORAL
  Filled 2020-11-03 (×4): qty 1
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
## 2020-11-03 NOTE — Progress Notes (Signed)
PT Cancellation Note  Patient Details Name: Crystal Moses MRN: [id number] DOB: 08/21/1955   Cancelled Treatment:    Reason Eval/Treat Not Completed: Medical issues which prohibited therapy.  Pt has a new femoral fracture with no WB information and not determined yet if surgery is needed.  Follow up as time and pt allow.  Awaiting medical decisions.   Ivar Drape 11/03/2020, 9:38 AM   Samul Dada, PT MS Acute Rehab Dept. Number: [facility] [id number] and [facility] [phone]

## 2020-11-03 NOTE — Progress Notes (Signed)
Initial Nutrition Assessment  INTERVENTION:   Once diet advanced: -Ensure Surgery po BID, each supplement provides 330 kcal and 18 grams of protein -Multivitamin with minerals daily -Magic cup TID with meals, each supplement provides 290 kcal and 9 grams of protein  NUTRITION DIAGNOSIS:   Increased nutrient needs related to hip fracture as evidenced by estimated needs.  GOAL:   Patient will meet greater than or equal to 90% of their needs  MONITOR:   PO intake,Supplement acceptance,Labs,Weight trends,I & O's  REASON FOR ASSESSMENT:   Consult Assessment of nutrition requirement/status  ASSESSMENT:   65 y.o. female with medical history significant for essential hypertension, hyperlipidemia, type 2 diabetes, polyneuropathy, chronic anxiety/depression, recently diagnosed Barrett's esophagus, unintentional weight loss, chronic anemia, OSA, who presented to [facility] ED after falling twice earlier today at home where she lives with her boyfriend.  States her legs gave out.  She reports generalized weakness in the past 2 months.  Associated with unintentional weight loss of 180 pounds.  She had a complete work-up done at [facility] due to unintentional weight loss.  Was diagnosed with Barrett's esophagus.  She feels that her weight loss has contributed to her generalized weakness.  She presented to the ED with severe left hip pain.  CT of left hip revealed nondisplaced left femoral intertrochanteric fracture.  Pt reporting significant amounts of weight loss. Was admitted to [facility] in February for unintentional weight loss, was diagnosed with barrett's esophagus. Pt was ordered Magic cups at that time, given reports of not liking Ensure/Boost.  Given continued weight loss and now with new left hip fracture, will need high calorie, high protein supplements. Will order Ensure surgery and Magic cups with meals once diet is advanced. Currently NPO, surgery needs pending.   Per weight records in care  everywhere, pt weighed 181 lbs on 01/25/20. Pt has lost 49 lbs since then (27% wt loss x 9 months, significant for time frame). Suspect some degree of malnutrition given weight loss.   Medications: Folic acid, Remeron, Senokot, Thiamine, IV Mg sulfate   Labs reviewed: CBGs: 98-107 Elevated Phos (4.7) Low Mg   NUTRITION - FOCUSED PHYSICAL EXAM:  Unable to complete -working remote  Diet Order:   Diet Order            Diet NPO time specified  Diet effective midnight                 EDUCATION NEEDS:   No education needs have been identified at this time  Skin:     Last BM:     Height:   Ht Readings from Last 1 Encounters:  11/02/20 5\' 3"  (1.6 m)    Weight:   Wt Readings from Last 1 Encounters:  11/02/20 59.9 kg   BMI:  Body mass index is 23.38 kg/m.  Estimated Nutritional Needs:   Kcal:  1800-2000  Protein:  85-100g  Fluid:  2L/day  11/04/20, MS, RD, LDN Inpatient Clinical Dietitian Contact information available via Amion

## 2020-11-03 NOTE — Progress Notes (Signed)
OT Cancellation Note  Patient Details Name: Crystal Moses MRN: [id number] DOB: 18-Oct-1955   Cancelled Treatment:    Reason Eval/Treat Not Completed: Patient not medically ready  Pt underwent CT of left hip which revealed nondisplaced left femoral intertrochanteric fracture. Pt currently with ortho consult pending. Will f/u for OT evaluation if appropriate when goals of care better established. May require new orders should pt require surgical intervention. Thank you.  Rejeana Brock, MS, OTR/L ascom [phone] 11/03/20, 12:36 PM

## 2020-11-03 NOTE — Progress Notes (Signed)
  Chaplain On-Call responded to Order Requisition for patient to receive Advance Directives information.  Chaplain provided the documents and education to the patient.  Patient stated that she will consider the documents further after her pain subsides.  Chaplain Evelena Peat M.Div., [facility]
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
## 2020-11-03 NOTE — Plan of Care (Deleted)
Patient evaluated by Orthopedics, Dr. Hyacinth Meeker, who discussed with me; Ortho feel she does not have an intertrochanteric fracture, merely a greater trochanteric fracture; this is non-operative, and she may be weight-bearing and undergo physical and occupational therapy.

## 2020-11-04 LAB — BASIC METABOLIC PANEL
Anion gap: 6 (ref 5–15)
BUN: 13 mg/dL (ref 8–23)
CO2: 26 mmol/L (ref 22–32)
Calcium: 7.5 mg/dL — ABNORMAL LOW (ref 8.9–10.3)
Chloride: 108 mmol/L (ref 98–111)
Creatinine, Ser: 1.06 mg/dL — ABNORMAL HIGH (ref 0.44–1.00)
GFR, Estimated: 58 mL/min — ABNORMAL LOW (ref >60)
Glucose, Bld: 118 mg/dL — ABNORMAL HIGH (ref 70–99)
Potassium: 3.4 mmol/L — ABNORMAL LOW (ref 3.5–5.1)
Sodium: 140 mmol/L (ref 135–145)

## 2020-11-04 LAB — MAGNESIUM: Magnesium: 1.5 mg/dL — ABNORMAL LOW (ref 1.7–2.4)

## 2020-11-04 LAB — CBC
HCT: 24.5 % — ABNORMAL LOW (ref 36.0–46.0)
Hemoglobin: 7.8 g/dL — ABNORMAL LOW (ref 12.0–15.0)
MCH: 33.3 pg (ref 26.0–34.0)
MCHC: 31.8 g/dL (ref 30.0–36.0)
MCV: 104.7 fL — ABNORMAL HIGH (ref 80.0–100.0)
Platelets: 183 10*3/uL (ref 150–400)
RBC: 2.34 MIL/uL — ABNORMAL LOW (ref 3.87–5.11)
RDW: 13.5 % (ref 11.5–15.5)
WBC: 7.6 10*3/uL (ref 4.0–10.5)
nRBC: 0 % (ref 0.0–0.2)

## 2020-11-04 LAB — GLUCOSE, CAPILLARY
Glucose-Capillary: 93 mg/dL (ref 70–99)
Glucose-Capillary: 138 mg/dL — ABNORMAL HIGH (ref 70–99)
Glucose-Capillary: 109 mg/dL — ABNORMAL HIGH (ref 70–99)

## 2020-11-04 MED ORDER — MAGNESIUM SULFATE 2 GM/50ML IV SOLN
2.0000 g | Freq: Once | INTRAVENOUS | Status: AC
  Administered 2020-11-04: 2 g via INTRAVENOUS
  Filled 2020-11-04: qty 50

## 2020-11-04 MED ORDER — SERTRALINE HCL 100 MG PO TABS
200.0000 mg | ORAL_TABLET | Freq: Every day | ORAL | Status: DC
  Administered 2020-11-05 – 2020-11-06 (×2): 200 mg via ORAL
  Filled 2020-11-04: qty 4
  Filled 2020-11-04: qty 2

## 2020-11-04 MED ORDER — POTASSIUM CHLORIDE CRYS ER 20 MEQ PO TBCR
40.0000 meq | EXTENDED_RELEASE_TABLET | Freq: Once | ORAL | Status: AC
  Administered 2020-11-04: 40 meq via ORAL
  Filled 2020-11-04: qty 2

## 2020-11-04 NOTE — Progress Notes (Signed)
RE: Crystal Moses Date of Birth: 05/03/2056 Date: 11/04/20   To Whom It May Concern:  Please be advised that the above-named patient will require a short-term nursing home stay - anticipated 30 days or less for rehabilitation and strengthening.  The plan is for return home.

## 2020-11-04 NOTE — NC FL2 (Signed)
[place] MEDICAID FL2 LEVEL OF CARE SCREENING TOOL     IDENTIFICATION  Patient Name: Crystal Moses Birthdate: 1955/12/16 Sex: female Admission Date (Current Location): 11/02/2020  [facility] and [place] Number:  Chiropodist and Address:  [facility], [address] [place], [place] [address]      Provider Number: [id number]  Attending Physician Name and Address:  Alberteen Sam, *  Relative Name and Phone Number:  Crystal Moses   [phone]    Current Level of Care: Hospital Recommended Level of Care: Skilled Nursing Facility Prior Approval Number:    Date Approved/Denied:   PASRR Number:    Discharge Plan: SNF    Current Diagnoses: Patient Active Problem List   Diagnosis Date Noted  . Greater trochanter fracture (HCC) 11/03/2020  . Closed left hip fracture, initial encounter (HCC) 11/02/2020    Orientation RESPIRATION BLADDER Height & Weight     Self,Time,Situation,Place  Normal External catheter,Incontinent Weight: 132 lb (59.9 kg) Height:  5\' 3"  (160 cm)  BEHAVIORAL SYMPTOMS/MOOD NEUROLOGICAL BOWEL NUTRITION STATUS        Diet  AMBULATORY STATUS COMMUNICATION OF NEEDS Skin   Limited Assist Verbally Normal                       Personal Care Assistance Level of Assistance  Bathing,Feeding,Dressing Bathing Assistance: Limited assistance Feeding assistance: Independent Dressing Assistance: Limited assistance     Functional Limitations Info             SPECIAL CARE FACTORS FREQUENCY  PT (By licensed PT),OT (By licensed OT)     PT Frequency: 5 x/week OT Frequency: 5 x/week            Contractures      Additional Factors Info  Code Status,Allergies Code Status Info: full code Allergies Info: nka           Current Medications (11/04/2020):  This is the current hospital active medication list Current Facility-Administered Medications  Medication Dose Route Frequency Provider Last Rate Last  Admin  . acetaminophen (TYLENOL) tablet 1,000 mg  1,000 mg Oral TID 11/06/2020, MD   1,000 mg at 11/04/20 0846  . atorvastatin (LIPITOR) tablet 80 mg  80 mg Oral Daily 11/06/20 N, DO   80 mg at 11/04/20 0846  . enoxaparin (LOVENOX) injection 40 mg  40 mg Subcutaneous Q24H 11/06/20 N, DO   40 mg at 11/03/20 2238  . feeding supplement (ENSURE SURGERY) liquid 237 mL  237 mL Oral BID BM 2239, MD   237 mL at 11/04/20 0844  . folic acid (FOLVITE) tablet 1 mg  1 mg Oral Daily [place], [place] N, DO   1 mg at 11/04/20 0846  . gabapentin (NEURONTIN) capsule 200 mg  200 mg Oral TID 11/06/20 N, DO   200 mg at 11/04/20 0846  . HYDROmorphone (DILAUDID) injection 0.5 mg  0.5 mg Intravenous Q3H PRN 11/06/20 N, DO   0.5 mg at 11/03/20 2251  . insulin aspart (novoLOG) injection 0-9 Units  0-9 Units Subcutaneous TID WC Hall, Carole N, DO      . methocarbamol (ROBAXIN) tablet 500 mg  500 mg Oral Q8H PRN 2252, MD   500 mg at 11/04/20 0845   Or  . methocarbamol (ROBAXIN) 500 mg in dextrose 5 % 50 mL IVPB  500 mg Intravenous Q8H PRN Danford, 11/06/20, MD      .  mirtazapine (REMERON) tablet 7.5 mg  7.5 mg Oral QHS Hall, Carole N, DO   7.5 mg at 11/03/20 2239  . multivitamin with minerals tablet 1 tablet  1 tablet Oral Daily Alberteen Sam, MD   1 tablet at 11/04/20 0845  . oxyCODONE (Oxy IR/ROXICODONE) immediate release tablet 5 mg  5 mg Oral Q4H PRN Dow Adolph N, DO   5 mg at 11/03/20 1431  . pantoprazole (PROTONIX) EC tablet 40 mg  40 mg Oral BID Dow Adolph N, DO   40 mg at 11/04/20 0845  . senna (SENOKOT) tablet 8.6 mg  1 tablet Oral QHS Dow Adolph N, DO   8.6 mg at 11/03/20 2238  . sertraline (ZOLOFT) tablet 150 mg  150 mg Oral Daily Dow Adolph N, DO   150 mg at 11/04/20 0845  . thiamine tablet 100 mg  100 mg Oral Daily Dow Adolph N, DO   100 mg at 11/04/20 0845  . traZODone (DESYREL) tablet 150 mg  150 mg Oral QHS PRN Danford,  Earl Lites, MD         Discharge Medications: Please see discharge summary for a list of discharge medications.  Relevant Imaging Results:  Relevant Lab Results:   Additional Information SS #: [id number]  Harlin Mazzoni E Simeon Vera, LCSW
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
## 2020-11-04 NOTE — Evaluation (Signed)
Physical Therapy Evaluation Patient Details Name: Crystal Moses MRN: [id number] DOB: [birth year] Today's Date: 11/04/2020   History of Present Illness  Crystal Moses is a 64yoF who comes to [facility] on 3/18 c CC Left hip pain. Pt reports a fall 1 week prior at home. Imaging revealing of fracture of Left greater trochanter, MRI results complicated by heavy motion artifact. 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 Orthopedics recommending conservative management at this time, TTWB c RW. Pt reports Left shoulder injury s/p recent fall less than 1 month ago. PMH: dCHF, DM, HLD, HTN, CAD s/p remote PCI, PND c bilat pedal numbness. Pt reports PLOF as household AMB c 4WW for past year, frequent falls with knee buckling, tolerates standing for only brief periods.  Clinical Impression  Pt admitted with above diagnosis. Pt currently with functional limitations due to the deficits listed below (see "PT Problem List"). Upon entry, pt in bed, awake and agreeable to participate, very motivated to get out of soiled/wet bed. . The pt is alert, pleasant, interactive, and able to provide some basic info regarding prior level of function, both in tolerance and independence, however conception of linear timelines are less clear. Pt is mildly anxious, somewhat labile crying a few times, of which she gives warning in advance to author. Pt seems to receive education regarding weight bearing status, but lacks sufficient strength to maintain TTWB. Pt assisted SPT minA to BSC, then SPT MinA BSC to recliner.   Patient's performance this date reveals decreased ability, independence, and tolerance in performing all basic mobility required for performance of activities of daily living. Pt requires additional DME, close physical assistance, and cues for safe participate in mobility. Pt has baseline weakness of BUE that will delay her ability to AMB more than just a couple feet and successfully maintain TTWB. Pt also has a rich falls  history, just recently with 2 significant orthopedic injuries (LUE and LLE), is now at higher risk for falling and further injury. Pt's boyfriend cannot physically assist her at baseline, and she is alone at home a few hours daily. Pt will benefit from skilled PT intervention to increase independence and safety with basic mobility in preparation for discharge to the venue listed below.       Follow Up Recommendations SNF;Supervision for mobility/OOB;Supervision - Intermittent    Equipment Recommendations  Other (comment) (would need a WC to DC to home;)    Recommendations for Other Services       Precautions / Restrictions Precautions Precautions: Fall Restrictions LLE Weight Bearing: Touchdown weight bearing Other Position/Activity Restrictions: Reports recent Left shoulder injurt s/p fall c rotator cuff injuy      Mobility  Bed Mobility Overal bed mobility: Needs Assistance Bed Mobility: Supine to Sit     Supine to sit: Min assist     General bed mobility comments: pain precludes independence of LLE; author assists minA of LLE, then everutally gives a RUE HHA for patient to full forward.    Transfers Overall transfer level: Needs assistance Equipment used: Rolling walker (2 wheeled) Transfers: Sit to/from [employer] Sit to Stand: Min assist Stand pivot transfers: Min assist       General transfer comment: anxious due to freq falls; needs cues for trunk flexion prior to rise, arms not enough to assist self; pt attempts TTWB on LLE, but lacks sufficicent strength in BUE to achieve compliance.  Ambulation/Gait Ambulation/Gait assistance:  (deferred; pt attempts TTWB on LLE, but lacks sufficicent strength in  BUE to achieve compliance.)              Stairs            Wheelchair Mobility    Modified Rankin (Stroke Patients Only)       Balance Overall balance assessment: History of Falls;Needs assistance Sitting-balance support:  Bilateral upper extremity supported;Feet supported;Feet unsupported Sitting balance-Leahy Scale: Poor     Standing balance support: Bilateral upper extremity supported;During functional activity Standing balance-Leahy Scale: Poor                               Pertinent Vitals/Pain Pain Assessment: Faces Pain Score: 1  Faces Pain Scale: Hurts whole lot (crying during bed mobility (partially due to frustration and labile affect)) Pain Location: Left hip at rest;    Home Living Family/patient expects to be discharged to:: Private residence Living Arrangements: Spouse/significant other ("Boyfriend"; nephew lives there intermittently) Available Help at Discharge: Family Type of Home: House Home Access: Stairs to enter Entrance Stairs-Rails: Left;Right (too large in diameter to be helpful) Entrance Stairs-Number of Steps: 8-9 Home Layout: One level Home Equipment: Walker - 4 wheels Additional Comments: frequent falls at home, reports her legs give out after they sound like 'popcorn' all of a sudden; her boyfriend made her a wooden tub bench    Prior Function Level of Independence: Needs assistance   Gait / Transfers Assistance Needed: household distance AMB c 4WW, does fall frequently; cant stand long-enough to shower;  ADL's / Homemaking Assistance Needed: difficulty, but is able to perform with modI  Comments: Pt reports left shoulder injury s/p fall "2 weeks ago"     Hand Dominance   Dominant Hand: Right    Extremity/Trunk Assessment   Upper Extremity Assessment Upper Extremity Assessment: LUE deficits/detail LUE Deficits / Details: lack active shoulder flexion, fluent in self hand over hand facilitation for RW placement etc; Pt reports injury to shoulder "2 weeks ago" details needed to confirm.    Lower Extremity Assessment Lower Extremity Assessment: Generalized weakness (bilat pedal numbness, reported chronic, has actove DF and PF when cued.)        Communication      Cognition Arousal/Alertness: Awake/alert Behavior During Therapy: Anxious;WFL for tasks assessed/performed Overall Cognitive Status: Within Functional Limits for tasks assessed                                 General Comments: struggles with some details, but mostly intact      General Comments      Exercises     Assessment/Plan    PT Assessment Patient needs continued PT services  PT Problem List Decreased strength;Decreased range of motion;Decreased activity tolerance;Decreased balance;Decreased mobility;Decreased knowledge of use of DME;Decreased knowledge of precautions       PT Treatment Interventions DME instruction;Gait training;Stair training;Functional mobility training;Therapeutic activities;Therapeutic exercise;Patient/family education;Cognitive remediation;Balance training    PT Goals (Current goals can be found in the Care Plan section)  Acute Rehab PT Goals Patient Stated Goal: regain ability to use LLE withotu pain/weakness PT Goal Formulation: With patient Time For Goal Achievement: 11/18/20 Potential to Achieve Goals: Fair    Frequency 7X/week   Barriers to discharge Decreased caregiver support;Inaccessible home environment 8 steps to enter, home alone a few hours each day    Co-evaluation  AM-PAC PT "6 Clicks" Mobility  Outcome Measure Help needed turning from your back to your side while in a flat bed without using bedrails?: A Lot Help needed moving from lying on your back to sitting on the side of a flat bed without using bedrails?: A Lot Help needed moving to and from a bed to a chair (including a wheelchair)?: A Lot Help needed standing up from a chair using your arms (e.g., wheelchair or bedside chair)?: A Lot Help needed to walk in hospital room?: Total Help needed climbing 3-5 steps with a railing? : Total 6 Click Score: 10    End of Session Equipment Utilized During Treatment: Gait  belt Activity Tolerance: Patient limited by pain;Patient limited by fatigue Patient left: in chair;with call bell/phone within reach;with chair alarm set Nurse Communication:  (linen, gown, sock, other sock.) PT Visit Diagnosis: Difficulty in walking, not elsewhere classified (R26.2);Repeated falls (R29.6);Muscle weakness (generalized) (M62.81)    Time: 2703-5009 PT Time Calculation (min) (ACUTE ONLY): 40 min   Charges:   PT Evaluation $PT Eval High Complexity: 1 High PT Treatments $Therapeutic Exercise: 23-37 mins        11:05 AM, 11/04/20 Rosamaria Lints, PT, DPT Physical Therapist - [facility]  [phone] (ASCOM)    Crystal Moses C 11/04/2020, 10:59 AM

## 2020-11-04 NOTE — Evaluation (Signed)
Occupational Therapy Evaluation Patient Details Name: Crystal Moses MRN: [id number] DOB: October 10, 1955 Today's Date: 11/04/2020    History of Present Illness Crystal Moses is a 64yoF who comes to [facility] on 3/18 c CC Left hip pain. Pt reports a fall 1 week prior at home. Imaging revealing of fracture of Left greater trochanter, MRI results complicated by heavy motion artifact. 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 Orthopedics recommending conservative management at this time, TTWB c RW. Pt reports Left shoulder injury s/p recent fall less than 1 month ago. PMH: dCHF, DM, HLD, HTN, CAD s/p remote PCI, PND c bilat pedal numbness. Pt reports PLOF as household AMB c 4WW for past year, frequent falls with knee buckling, tolerates standing for only brief periods.   Clinical Impression   Pt seen for OT evaluation this date. Prior to admission, pt was MOD-I (with [phone]) for functional mobility of household distances and MOD-I for ADLs (spongebathing at baseline), living in a one-level house (8-9 steps to enter) with significant other. Pt currently presents with increased pain and decreased balance, activity tolerance, and safety awareness, and currently requires MIN A for sit<>stand transfers, MIN A for sit to/from stand LB bathing, SUPERVISION/SET-UP for seated UB bathing/dressing, and MIN A for seated overhead grooming tasks. Pt would benefit from additional instruction in self care skills and techniques to help maintain precautions, with or without assistive devices, to support recall and carryover prior to discharge. Recommend SNF upon discharge.    Follow Up Recommendations  SNF    Equipment Recommendations  Other (comment) (defer to next venue of care)       Precautions / Restrictions Precautions Precautions: Fall Restrictions Weight Bearing Restrictions: Yes LLE Weight Bearing: Touchdown weight bearing Other Position/Activity Restrictions: Reports recent Left shoulder injurt s/p fall c rotator  cuff injuy      Mobility Bed Mobility               General bed mobility comments: not assessed, pt in recliner upon arrival    Transfers Overall transfer level: Needs assistance Equipment used: Rolling walker (2 wheeled) Transfers: Sit to/from Stand Sit to Stand: Min assist              Balance Overall balance assessment: History of Falls;Needs assistance Sitting-balance support: No upper extremity supported;Feet supported Sitting balance-Leahy Scale: Fair Sitting balance - Comments: Fair sitting balance in recliner; able to reach beyond BOS for washcloth during seated bathing   Standing balance support: Bilateral upper extremity supported;During functional activity Standing balance-Leahy Scale: Poor                             ADL either performed or assessed with clinical judgement   ADL Overall ADL's : Needs assistance/impaired     Grooming: Brushing hair;Minimal assistance;Sitting Grooming Details (indicate cue type and reason): MIN A for back of head Upper Body Bathing: Supervision/ safety;Set up;Sitting   Lower Body Bathing: Minimal assistance;Sit to/from stand Lower Body Bathing Details (indicate cue type and reason): MIN A for to ensure thoroughness of posterior b/l LE Upper Body Dressing : Supervision/safety;Set up;Sitting Upper Body Dressing Details (indicate cue type and reason): to don hospital gown                                      Pertinent Vitals/Pain Pain Assessment: Faces Faces Pain Scale: Hurts  whole lot Pain Location: L hip Pain Descriptors / Indicators: Discomfort Pain Intervention(s): Limited activity within patient's tolerance;Monitored during session;Repositioned     Hand Dominance Right   Extremity/Trunk Assessment Upper Extremity Assessment Upper Extremity Assessment: Generalized weakness;LUE deficits/detail LUE Deficits / Details: Pt with lack active shoulder flexion, self-initiating HHA from pt's  R UE to hold RW, washcloth, and hair brush. Pt reports left shoulder injury s/p fall; unclear date of injury (told this Thereasa Parkin it is from most recent fall, however told PT it was from fall two weeks ago)   Lower Extremity Assessment Lower Extremity Assessment: Generalized weakness       Communication Communication Communication: No difficulties   Cognition Arousal/Alertness: Awake/alert Behavior During Therapy: WFL for tasks assessed/performed Overall Cognitive Status: No family/caregiver present to determine baseline cognitive functioning                                 General Comments: Pt oriented to self, place, and situation. Pt with inconsistent reports regarding previous injuries. Pt teary-eyed and stating "maybe it would've been better if I did hit my head"; OT provided comfort. Pt agreeable to session and motivated to participate in functional mobility despite pain at L hip   General Comments               Home Living Family/patient expects to be discharged to:: Private residence Living Arrangements: Spouse/significant other ("Boyfriend"; nephew lives there intermittently) Available Help at Discharge: Family Type of Home: House Home Access: Stairs to enter Secretary/administrator of Steps: 8-9 Entrance Stairs-Rails: Left;Right (too large in diameter to be helpful) Home Layout: One level     Bathroom Shower/Tub: Other (comment) (spongebathes at baseline)         Home Equipment: Walker - 4 wheels;Tub bench (Pt states her boyfriend made her a wooden tub bench)          Prior Functioning/Environment Level of Independence: Needs assistance  Gait / Transfers Assistance Needed: Pt reports using 4WW for functional mobility of household distances. Endorses falling frequently and not being able to stand long-enough to shower ADL's / Homemaking Assistance Needed: Pt reports being MOD-I with ADLs, spongebathing at baseline   Comments: Pt reports left  shoulder injury s/p fall; unclear date of injury (told this Thereasa Parkin it is from most recent fall, however told PT it was from fall two weeks ago)        OT Problem List: Decreased strength;Decreased activity tolerance;Impaired balance (sitting and/or standing);Decreased knowledge of precautions      OT Treatment/Interventions: Self-care/ADL training;Therapeutic exercise;Energy conservation;DME and/or AE instruction;Therapeutic activities;Patient/family education;Balance training    OT Goals(Current goals can be found in the care plan section) Acute Rehab OT Goals Patient Stated Goal: to walk without rollator OT Goal Formulation: With patient Time For Goal Achievement: 11/18/20 Potential to Achieve Goals: Poor ADL Goals Pt Will Perform Grooming: standing;with supervision Pt Will Transfer to Toilet: with min guard assist;ambulating;regular height toilet;grab bars Pt Will Perform Toileting - Clothing Manipulation and hygiene: with min guard assist;sit to/from stand  OT Frequency: Min 1X/week    AM-PAC OT "6 Clicks" Daily Activity     Outcome Measure Help from another person eating meals?: None Help from another person taking care of personal grooming?: A Little Help from another person toileting, which includes using toliet, bedpan, or urinal?: A Little Help from another person bathing (including washing, rinsing, drying)?: A Little Help from another person to put  on and taking off regular upper body clothing?: A Little Help from another person to put on and taking off regular lower body clothing?: A Lot 6 Click Score: 18   End of Session Equipment Utilized During Treatment: Gait belt;Rolling walker Nurse Communication: Mobility status  Activity Tolerance: Patient tolerated treatment well Patient left: in chair;with call bell/phone within reach;with chair alarm set  OT Visit Diagnosis: Unsteadiness on feet (R26.81);Muscle weakness (generalized) (M62.81);History of falling (Z91.81)                 Time: 2725-3664 OT Time Calculation (min): 30 min Charges:  OT General Charges $OT Visit: 1 Visit OT Evaluation $OT Eval Moderate Complexity: 1 Mod OT Treatments $Self Care/Home Management : 8-22 mins  Matthew Folks, OTR/L [facility] [phone]

## 2020-11-04 NOTE — Progress Notes (Signed)
Subjective:   Patient is feeling better today.  She is out of bed in a chair.  She is tender over the greater trochanter only.  Hip flexion does not produce any significant pain.  Leg lengths are equal.  She is known to have severe arthritis of the hip.    Patient reports pain as mild.  Objective:   Patient is out of bed in a chair.  She seems to have less pain to the knee.  Neurovascular status good distally.  VITALS:   Vitals:   11/04/20 0405 11/04/20 0800  BP: (!) 89/77 133/82  Pulse: 74 83  Resp: 17 16  Temp: 98.2 F (36.8 C) 98.3 F (36.8 C)  SpO2: 97% 98%     LABS Recent Labs    11/02/20 1457 11/03/20 0437 11/04/20 0429  HGB 9.2* 8.1* 7.8*  HCT 27.9* 24.6* 24.5*  WBC 8.0 9.9 7.6  PLT 239 199 183    Recent Labs    11/02/20 1457 11/03/20 0437 11/04/20 0429  NA 139 140 140  K 4.0 3.8 3.4*  BUN 10 10 13   CREATININE 0.99 1.06* 1.06*  GLUCOSE 105* 120* 118*    No results for input(s): LABPT, INR in the last 72 hours.   Assessment/Plan:      Up with therapy Discharge to SNF

## 2020-11-04 NOTE — TOC Initial Note (Addendum)
Transition of Care [facility]) - Initial/Assessment Note    Patient Details  Name: Crystal Moses MRN: [id number] Date of Birth: 12-02-1955  [facility] of [facility]) CM/SW Contact:    Magnus Ivan, LCSW Phone Number: 11/04/2020, 12:22 PM  Clinical Narrative:             CSW met with patient and significant other ([name]) at bedside. Patient lives with [name] who drives her to appointments. PCP is Galen Daft. Pharmacy is [facility]. Patient has a RW, rollator, and shower chair at home. No HH or SNF history. Patient has not been vaccinated for COVID 19. Patient is agreeable to SNF recommendation. No SNF preference. CSW started SNF work up and [employer] fast streamer.Spoke to [employer], [place] Reference #: [id number]. Faxed clinicals. Navi will need to be updated when patient has SNF bed offers and chooses SNF.   Expected Discharge Plan: Skilled Nursing Facility Barriers to Discharge: Continued Medical Work up   Patient Goals and CMS Choice Patient states their goals for this hospitalization and ongoing recovery are:: SNF rehab CMS Medicare.gov Compare Post Acute Care list provided to:: Patient Choice offered to / list presented to : [facility]  Expected Discharge Plan and Services Expected Discharge Plan: [place]       Living arrangements for the past 2 months: Single Family Home                                      Prior Living Arrangements/Services Living arrangements for the past 2 months: Single Family Home Lives with:: Significant Other Patient language and need for interpreter reviewed:: Yes Do you feel safe going back to the place where you live?: Yes      Need for Family Participation in Patient Care: Yes (Comment) Care giver support system in place?: Yes (comment) Current home services: DME Criminal Activity/Legal Involvement Pertinent to Current Situation/Hospitalization: No - Comment as needed  Activities of Daily Living Home Assistive  Devices/Equipment: Walker (specify type),Cane (specify quad or straight),Dentures (specify type) (rollator, straight cane, upper denture) ADL Screening (condition at time of admission) Patient's cognitive ability adequate to safely complete daily activities?: Yes Is the patient deaf or have difficulty hearing?: No Does the patient have difficulty seeing, even when wearing glasses/contacts?: No Does the patient have difficulty concentrating, remembering, or making decisions?: No Patient able to express need for assistance with ADLs?: Yes Does the patient have difficulty dressing or bathing?: Yes Independently performs ADLs?: No Communication: Independent Dressing (OT): Needs assistance Is this a change from baseline?: Change from baseline, expected to last >3 days Grooming: Independent Feeding: Independent Bathing: Needs assistance Is this a change from baseline?: Change from baseline, expected to last >3 days Toileting: Needs assistance Is this a change from baseline?: Change from baseline, expected to last >3days In/Out Bed: Needs assistance Is this a change from baseline?: Change from baseline, expected to last >3 days Walks in Home: Needs assistance Is this a change from baseline?: Change from baseline, expected to last >3 days Does the patient have difficulty walking or climbing stairs?: Yes Weakness of Legs: Left Weakness of Arms/Hands: None  Permission Sought/Granted Permission sought to share information with : Facility Contact Representative,Family Supports Permission granted to share information with : Yes, Verbal Permission Granted     Permission granted to share info w AGENCY: SNFs        Emotional Assessment  Orientation: : Oriented to Self,Oriented to Place,Oriented to  Time,Oriented to Situation Alcohol / Substance Use: Not Applicable Psych Involvement: No (comment)  Admission diagnosis:  Closed fracture of left hip, initial encounter ([place])  [S72.002A] Closed left hip fracture, initial encounter ([place]) [S72.002A] Greater trochanter fracture ([place]) [S72.113A] Patient Active Problem List   Diagnosis Date Noted  . Greater trochanter fracture ([place]) 11/03/2020  . Closed left hip fracture, initial encounter ([place]) 11/02/2020   PCP:  Willaim Rayas, FNP Pharmacy:   [facility] [address] [place] [address] Phone: [phone] Fax: [phone]     Social Determinants of Health (SDOH) Interventions    Readmission Risk Interventions No flowsheet data found.

## 2020-11-05 ENCOUNTER — Inpatient Hospital Stay: Payer: Medicare HMO

## 2020-11-05 LAB — GLUCOSE, CAPILLARY
Glucose-Capillary: 71 mg/dL (ref 70–99)
Glucose-Capillary: 82 mg/dL (ref 70–99)
Glucose-Capillary: 91 mg/dL (ref 70–99)
Glucose-Capillary: 80 mg/dL (ref 70–99)

## 2020-11-05 LAB — TSH: TSH: 0.695 u[IU]/mL (ref 0.350–4.500)

## 2020-11-05 LAB — COMPREHENSIVE METABOLIC PANEL
ALT: 23 U/L (ref 0–44)
AST: 26 U/L (ref 15–41)
Albumin: 1.8 g/dL — ABNORMAL LOW (ref 3.5–5.0)
Alkaline Phosphatase: 57 U/L (ref 38–126)
Anion gap: 3 — ABNORMAL LOW (ref 5–15)
BUN: 12 mg/dL (ref 8–23)
CO2: 28 mmol/L (ref 22–32)
Calcium: 7.3 mg/dL — ABNORMAL LOW (ref 8.9–10.3)
Chloride: 111 mmol/L (ref 98–111)
Creatinine, Ser: 1.1 mg/dL — ABNORMAL HIGH (ref 0.44–1.00)
GFR, Estimated: 56 mL/min — ABNORMAL LOW (ref >60)
Glucose, Bld: 79 mg/dL (ref 70–99)
Potassium: 3.6 mmol/L (ref 3.5–5.1)
Sodium: 142 mmol/L (ref 135–145)
Total Bilirubin: 0.6 mg/dL (ref 0.3–1.2)
Total Protein: 4.8 g/dL — ABNORMAL LOW (ref 6.5–8.1)

## 2020-11-05 LAB — IRON AND TIBC
Iron: 30 ug/dL (ref 28–170)
Saturation Ratios: 30 % (ref 10.4–31.8)
TIBC: 101 ug/dL — ABNORMAL LOW (ref 250–450)
UIBC: 71 ug/dL

## 2020-11-05 LAB — CBC
HCT: 23.2 % — ABNORMAL LOW (ref 36.0–46.0)
Hemoglobin: 7.6 g/dL — ABNORMAL LOW (ref 12.0–15.0)
MCH: 33.9 pg (ref 26.0–34.0)
MCHC: 32.8 g/dL (ref 30.0–36.0)
MCV: 103.6 fL — ABNORMAL HIGH (ref 80.0–100.0)
Platelets: 194 10*3/uL (ref 150–400)
RBC: 2.24 MIL/uL — ABNORMAL LOW (ref 3.87–5.11)
RDW: 13.4 % (ref 11.5–15.5)
WBC: 8 10*3/uL (ref 4.0–10.5)
nRBC: 0 % (ref 0.0–0.2)

## 2020-11-05 LAB — VITAMIN B12: Vitamin B-12: 382 pg/mL (ref 180–914)

## 2020-11-05 LAB — PREPARE RBC (CROSSMATCH)

## 2020-11-05 LAB — ABO/RH: ABO/RH(D): A NEG

## 2020-11-05 LAB — RETIC PANEL
Immature Retic Fract: 19.4 % — ABNORMAL HIGH (ref 2.3–15.9)
RBC.: 2.27 MIL/uL — ABNORMAL LOW (ref 3.87–5.11)
Retic Count, Absolute: 54.3 10*3/uL (ref 19.0–186.0)
Retic Ct Pct: 2.4 % (ref 0.4–3.1)
Reticulocyte Hemoglobin: 32.8 pg (ref >27.9)

## 2020-11-05 LAB — FOLATE: Folate: 33 ng/mL (ref >5.9)

## 2020-11-05 LAB — PATHOLOGIST SMEAR REVIEW

## 2020-11-05 LAB — FERRITIN: Ferritin: 290 ng/mL (ref 11–307)

## 2020-11-05 LAB — PREALBUMIN: Prealbumin: 6.3 mg/dL — ABNORMAL LOW (ref 18–38)

## 2020-11-05 MED ORDER — ENSURE ENLIVE PO LIQD
237.0000 mL | Freq: Three times a day (TID) | ORAL | Status: DC
  Administered 2020-11-05 – 2020-11-06 (×3): 237 mL via ORAL

## 2020-11-05 MED ORDER — SODIUM CHLORIDE 0.9% IV SOLUTION: Freq: Once | INTRAVENOUS | Status: AC

## 2020-11-05 MED ORDER — ASCORBIC ACID 500 MG PO TABS
500.0000 mg | ORAL_TABLET | Freq: Every day | ORAL | Status: DC
  Administered 2020-11-06: 500 mg via ORAL
  Filled 2020-11-05: qty 1

## 2020-11-05 NOTE — TOC Progression Note (Addendum)
Transition of Care [facility]) - Progression Note    Patient Details  Name: AUDRIE KURI MRN: [id number] Date of Birth: 12-27-1955  [facility]) CM/SW Contact  Margarito Liner, LCSW Phone Number: 11/05/2020, 11:14 AM  Clinical Narrative:  Reviewed bed offers with patient: [facility], [facility], and [facility] [place]. Also reviewed those that were still pending. She is interested in [facility]. Left message for admissions coordinator asking her to review. Patient confirmed she is on cpap at home and stated someone would be able to bring it to the facility for her.   3:48 pm: [facility] is able to take patient tomorrow. Patient is aware and will confirm with her son before official acceptance. MD will order COVID test and CSW will start auth once acceptance confirmed.  4:43 pm: Patient has spoken with son and accepted bed offer. Assigned facility to pending authorization.  Expected Discharge Plan: Skilled Nursing Facility Barriers to Discharge: Continued Medical Work up  Expected Discharge Plan and Services Expected Discharge Plan: Skilled Nursing Facility       Living arrangements for the past 2 months: Single Family Home                                       Social Determinants of Health (SDOH) Interventions    Readmission Risk Interventions No flowsheet data found.

## 2020-11-05 NOTE — Progress Notes (Signed)
Nutrition Follow-up  DOCUMENTATION CODES:   Non-severe (moderate) malnutrition in context of chronic illness  INTERVENTION:   -D/c Ensure Surgery -Ensure Enlive po TID, each supplement provides 350 kcal and 20 grams of protein -MVI with minerals daily -Magic cup TID with meals, each supplement provides 290 kcal and 9 grams of protein  NUTRITION DIAGNOSIS:   Moderate Malnutrition related to chronic illness (Barrett's esophagus) as evidenced by mild fat depletion,moderate fat depletion,mild muscle depletion,moderate muscle depletion,percent weight loss,energy intake < or equal to 75% for > or equal to 1 month.  Ongoing  GOAL:   Patient will meet greater than or equal to 90% of their needs  Progressing   MONITOR:   PO intake,Supplement acceptance,Labs,Weight trends,Skin,I & O's  REASON FOR ASSESSMENT:   Consult Assessment of nutrition requirement/status  ASSESSMENT:   65 y.o. female with medical history significant for essential hypertension, hyperlipidemia, type 2 diabetes, polyneuropathy, chronic anxiety/depression, recently diagnosed Barrett's esophagus, unintentional weight loss, chronic anemia, OSA, who presented to [facility] ED after falling twice earlier today at home where she lives with her boyfriend.  States her legs gave out.  She reports generalized weakness in the past 2 months.  Associated with unintentional weight loss of 180 pounds.  She had a complete work-up done at [facility] due to unintentional weight loss.  Was diagnosed with Barrett's esophagus.  She feels that her weight loss has contributed to her generalized weakness.  She presented to the ED with severe left hip pain.  CT of left hip revealed nondisplaced left femoral intertrochanteric fracture.  Reviewed I/O's: +777 ml x 24 hours and +1.1 L since admission  Per orthopedics notes, no plans for surgery at this time.   Spoke with pt at bedside, who reports feeling a little worse today. She shares she has  experienced a general decline in health over the past year, due to decreased appetite ans weakness. Per pt, she can only consume one meal per day in the evening, which consists of a meat, starch, and vegetable. Pt reports her diet has consisted of mostly liquids, specifically tea and soda. Per pt, whenever she tries to eat solid foods "it comes right back up". This occurs more often when pt consumes "heavy" or hot foods. She consumed from fresh fruit and tea today. Meal completions 25-40%.   Pt reports that her son "is worried sick" about her poor oral intake and weight loss. She shares her UBW is around 180# and has experienced progressive wt loss over the past year (about 50 pounds). Reviewed wt hx; pt has experienced a 27% wt loss over te past 2 months, which is significant for time frame. Pt shares she has become more weak and has been eating less over the past month or so.   Discussed with pt importance of good meal and supplement intake to promote healing. Reviewed menu items and encouraged pt to select foods with less odors to help combat nausea. Pt is amenable to chocolate Ensure Enlive supplements and Magic Cups.   Medications reviewed and include folic acid, senokot, thiamine, and remeron.   Labs reviewed: CBGS: 82.  NUTRITION - FOCUSED PHYSICAL EXAM:  Flowsheet Row Most Recent Value  Orbital Region No depletion  Upper Arm Region Moderate depletion  Thoracic and Lumbar Region No depletion  Buccal Region Mild depletion  Temple Region Mild depletion  Clavicle Bone Region Mild depletion  Clavicle and Acromion Bone Region Mild depletion  Scapular Bone Region Mild depletion  Dorsal Hand Mild depletion  Patellar Region  Moderate depletion  Anterior Thigh Region Moderate depletion  Posterior Calf Region Moderate depletion  Edema (RD Assessment) Mild  Hair Reviewed  Eyes Reviewed  Mouth Reviewed  Skin Reviewed  Nails Reviewed       Diet Order:   Diet Order            Diet  regular Room service appropriate? Yes; Fluid consistency: Thin  Diet effective now                 EDUCATION NEEDS:   Education needs have been addressed  Skin:  Skin Assessment: Reviewed RN Assessment  Last BM:  11/05/20  Height:   Ht Readings from Last 1 Encounters:  11/02/20 5\' 3"  (1.6 m)    Weight:   Wt Readings from Last 1 Encounters:  11/02/20 59.9 kg    Ideal Body Weight:  52.3 kg  BMI:  Body mass index is 23.38 kg/m.  Estimated Nutritional Needs:   Kcal:  1750-1950  Protein:  90-105 grams  Fluid:  > 1.7 L    11/04/20, RD, LDN, CDCES Registered Dietitian II Certified Diabetes Care and Education Specialist Please refer to [facility] for RD and/or RD on-call/weekend/after hours pager

## 2020-11-05 NOTE — Progress Notes (Signed)
[facility] PROGRESS [facility]    [place] MinersReba T Selk  ZOX:[id number] DOB: 1956-08-14 DOA: 11/02/2020 PCP: Herschell Dimeslapp, Teresa J, FNP      Brief Narrative:  Crystal Moses is a 65 y.o. F with CAD s/p PCI >5 yrs, dCHF, HTN, OSA on CPAP, 100lbs weight loss unexplained, Barrett's, depression, and anemia and DM who presented with fall and hip pain.  Patient has had increasing generalized weakness in the context of her weight loss recently.  She is fallen several times in the last few days, today her legs gave out, she fell, afterwards had left hip pain.  In the ER, CT of the left hip suggested hip fracture or a fracture of the greater trochanter.      Assessment & Plan:  LEFT nondisplaced greater trochanter fracture Pain is better controlled, work with PT today -Continue scheduled acetaminophen with as needed oxycodone and Robaxin -Discharge to SNF when available     Symptomatic macrocytic anemia Hemoglobin trending down.  Feels weak, severe fatigue.  No clinical bleeding.     TSH, folate, B12 all normal.  Smear shows a severe macrocytic anemia consistent with malnutrition (which is consistent with the clinical picture of poor PO intake, 100 lbs weight loss)  -Continue folate -Start MVI -Consult dietitian -Start Ensure  -Transfuse 1 unit for symptomatic anemia    Chronic diastolic CHF Hypertension Coronary disease, secondary prevention Blood pressure normal, euvolemic. -Continue atorvastatin -Hold enalapril, metoprolol   OSA -Continue CPAP at night  Weight loss -Has an outpatient follow-up with GI already scheduled -Also needs outpatient counseling  Barrett's esophagus -Continue PPI  Depression -Continue mirtazapine, sertraline -Increase sertraline to max dose 200 daily   Severe protein calorie malnutrition  Given >100 lb weight loss resulting in anemia, with decreased subcutaneous muscle and fat, and albumin <2, prealbumin <7. -Consult  nutrition   Hypomagnesemia and hypokalemia -Repeat supplement K and mag  Diabetes with neuropathy Glucoses controlled -Continue SS correction insulin -Continue gabapentin -Hold metformin         Disposition: Status is: inpatient  The patient will require care spanning > 2 midnights: Unsafe d/c plan  Dispo:  Patient From: Home  Planned Disposition: Skilled Nursing Facility  Medically stable for discharge: Yes     Level of care: Med-Surg       MDM: The below labs and imaging reports were reviewed and summarized above.  Medication management as above.    DVT prophylaxis: enoxaparin (LOVENOX) injection 40 mg Start: 11/02/20 2345  Code Status: FULL Family Communication:       Consultants:   Orthopedics  Procedures:   3/18 CT hip   3/18 MR hip  Discussed both with Orthopedics who feel she does not have an intertrochanteric fracture        Subjective: No new fever, confusion, chest pain, dyspnea, palpitations, swelling.  She has left hip painbut this is improved.  Objective: Vitals:   11/05/20 1149 11/05/20 1554 11/05/20 1555 11/05/20 1612  BP: (!) 142/85  (!) 143/94 134/79  Pulse: 94  92 95  Resp: 17  16 16   Temp: 98.5 F (36.9 C) 98.7 F (37.1 C) 98.7 F (37.1 C) 98.6 F (37 C)  TempSrc:  Oral Oral Oral  SpO2: 100%  100% 100%  Weight:      Height:       No intake or output data in the 24 hours ending 11/05/20 1934 Filed Weights   11/02/20 1453  Weight: 59.9 kg    Examination: General appearance:  adult female, alert and in moderate distress from pain.   HEENT: Anicteric, conjunctiva pink, lids and lashes normal. No nasal deformity, discharge, epistaxis.  Lips moist.   Skin: Warm and dry.  no jaundice.  No suspicious rashes or lesions. Cardiac: RRR, nl S1-S2, no murmurs appreciated.  Capillary refill is brisk.  JVP normal.  No LE edema.  Radial pulses 2+ and symmetric. Respiratory: Normal respiratory rate and rhythm.  CTAB without  rales or wheezes. Abdomen: Abdomen soft.  no TTP or grimace to palpation. No ascites, distension, hepatosplenomegaly.   MSK: No deformities or effusions. Neuro: Awake and alert.  EOMI, moves all extremities. Speech fluent.    Psych: Sensorium intact and responding to questions, attention normal. Affect anxious from pain.  Judgment and insight appear normal.    Data Reviewed: I have personally reviewed following labs and imaging studies:  CBC: Recent Labs  Lab 11/02/20 1457 11/03/20 0437 11/04/20 0429 11/05/20 0436  WBC 8.0 9.9 7.6 8.0  NEUTROABS 5.5 8.2*  --   --   HGB 9.2* 8.1* 7.8* 7.6*  HCT 27.9* 24.6* 24.5* 23.2*  MCV 103.7* 103.4* 104.7* 103.6*  PLT 239 199 183 194   Basic Metabolic Panel: Recent Labs  Lab 11/02/20 1457 11/03/20 0437 11/04/20 0429 11/05/20 0436  NA 139 140 140 142  K 4.0 3.8 3.4* 3.6  CL 107 108 108 111  CO2 24 26 26 28   GLUCOSE 105* 120* 118* 79  BUN 10 10 13 12   CREATININE 0.99 1.06* 1.06* 1.10*  CALCIUM 7.5* 7.5* 7.5* 7.3*  MG  --  1.6* 1.5*  --   PHOS  --  4.7*  --   --    GFR: Estimated Creatinine Clearance: 42.2 mL/min (A) (by C-G formula based on SCr of 1.1 mg/dL (H)). Liver Function Tests: Recent Labs  Lab 11/03/20 0437 11/05/20 0436  AST 38 26  ALT 26 23  ALKPHOS 55 57  BILITOT 0.8 0.6  PROT 4.9* 4.8*  ALBUMIN 2.0* 1.8*   No results for input(s): LIPASE, AMYLASE in the last 168 hours. No results for input(s): AMMONIA in the last 168 hours. Coagulation Profile: No results for input(s): INR, PROTIME in the last 168 hours. Cardiac Enzymes: No results for input(s): CKTOTAL, CKMB, CKMBINDEX, TROPONINI in the last 168 hours. BNP (last 3 results) No results for input(s): PROBNP in the last 8760 hours. HbA1C: No results for input(s): HGBA1C in the last 72 hours. CBG: Recent Labs  Lab 11/04/20 1149 11/04/20 1650 11/05/20 0814 11/05/20 1151 11/05/20 1655  GLUCAP 138* 109* 71 82 91   Lipid Profile: No results for  input(s): CHOL, HDL, LDLCALC, TRIG, CHOLHDL, LDLDIRECT in the last 72 hours. Thyroid Function Tests: Recent Labs    11/05/20 0436  TSH 0.695   Anemia Panel: Recent Labs    11/05/20 0436  VITAMINB12 382  FOLATE 33.0  FERRITIN 290  TIBC 101*  IRON 30  RETICCTPCT 2.4   Urine analysis: No results found for: COLORURINE, APPEARANCEUR, LABSPEC, PHURINE, GLUCOSEU, HGBUR, BILIRUBINUR, KETONESUR, PROTEINUR, UROBILINOGEN, NITRITE, LEUKOCYTESUR Sepsis Labs: @LABRCNTIP (procalcitonin:4,lacticacidven:4)  ) Recent Results (from the past 240 hour(s))  Resp Panel by RT-PCR (Flu A&B, Covid) Nasopharyngeal Swab     Status: None   Collection Time: 11/02/20  7:17 PM   Specimen: Nasopharyngeal Swab; Nasopharyngeal(NP) swabs in vial transport medium  Result Value Ref Range Status   SARS Coronavirus 2 by RT PCR NEGATIVE NEGATIVE Final    Comment: (NOTE) SARS-CoV-2 target nucleic acids are NOT DETECTED.  The SARS-CoV-2 RNA is generally detectable in upper respiratory specimens during the acute phase of infection. The lowest concentration of SARS-CoV-2 viral copies this assay can detect is 138 copies/mL. A negative result does not preclude SARS-Cov-2 infection and should not be used as the sole basis for treatment or other patient management decisions. A negative result may occur with  improper specimen collection/handling, submission of specimen other than nasopharyngeal swab, presence of viral mutation(s) within the areas targeted by this assay, and inadequate number of viral copies(<138 copies/mL). A negative result must be combined with clinical observations, patient history, and epidemiological information. The expected result is Negative.  Fact Sheet for Patients:  BloggerCourse.com  Fact Sheet for Healthcare Providers:  SeriousBroker.it  This test is no t yet approved or cleared by the [place] FDA and  has been authorized for  detection and/or diagnosis of SARS-CoV-2 by FDA under an Emergency Use Authorization (EUA). This EUA will remain  in effect (meaning this test can be used) for the duration of the COVID-19 declaration under Section 564(b)(1) of the Act, 21 U.S.C.section 360bbb-3(b)(1), unless the authorization is terminated  or revoked sooner.       Influenza A by PCR NEGATIVE NEGATIVE Final   Influenza B by PCR NEGATIVE NEGATIVE Final    Comment: (NOTE) The Xpert Xpress SARS-CoV-2/FLU/RSV plus assay is intended as an aid in the diagnosis of influenza from Nasopharyngeal swab specimens and should not be used as a sole basis for treatment. Nasal washings and aspirates are unacceptable for Xpert Xpress SARS-CoV-2/FLU/RSV testing.  Fact Sheet for Patients: BloggerCourse.com  Fact Sheet for Healthcare Providers: SeriousBroker.it  This test is not yet approved or cleared by the [place] FDA and has been authorized for detection and/or diagnosis of SARS-CoV-2 by FDA under an Emergency Use Authorization (EUA). This EUA will remain in effect (meaning this test can be used) for the duration of the COVID-19 declaration under Section 564(b)(1) of the Act, 21 U.S.C. section 360bbb-3(b)(1), unless the authorization is terminated or revoked.  Performed at [facility], [address] [place], [place] [address]          Radiology Studies: DG Shoulder Left  Result Date: 11/05/2020 CLINICAL DATA:  Pain EXAM: LEFT SHOULDER - 2+ VIEW COMPARISON:  None. FINDINGS: Frontal, oblique, and Y scapular images were obtained. No fracture or dislocation. There is no appreciable joint space narrowing. No erosive change. There is relative bony overgrowth along the lateral aspect of the acromion. No erosion. Visualized left lung clear. IMPRESSION: No fracture or dislocation. No appreciable joint space narrowing. Bony overgrowth along the lateral acromion  potentially places patient at increased risk for a degree of impingement syndrome. Electronically Signed   By: Bretta Bang III M.D.   On: 11/05/2020 10:05        Scheduled Meds: . acetaminophen  1,000 mg Oral TID  . atorvastatin  80 mg Oral Daily  . enoxaparin (LOVENOX) injection  40 mg Subcutaneous Q24H  . feeding supplement  237 mL Oral TID BM  . folic acid  1 mg Oral Daily  . gabapentin  200 mg Oral TID  . insulin aspart  0-9 Units Subcutaneous TID WC  . mirtazapine  7.5 mg Oral QHS  . multivitamin with minerals  1 tablet Oral Daily  . pantoprazole  40 mg Oral BID  . senna  1 tablet Oral QHS  . sertraline  200 mg Oral Daily  . thiamine  100 mg Oral Daily   Continuous Infusions: .  methocarbamol (ROBAXIN) IV       LOS: 2 days    Time spent: 25 minutes    Alberteen Sam, MD [facility] 11/05/2020, 7:34 PM     Please page though AMION or Epic secure chat:  For [employer], Higher education careers adviser

## 2020-11-05 NOTE — Plan of Care (Signed)

## 2020-11-05 NOTE — Plan of Care (Signed)
  Problem: Clinical Measurements: Goal: Ability to maintain clinical measurements within normal limits will improve Outcome: Progressing Goal: Will remain free from infection Outcome: Progressing Goal: Diagnostic test results will improve Outcome: Progressing Goal: Respiratory complications will improve Outcome: Progressing Goal: Cardiovascular complication will be avoided Outcome: Progressing   Problem: Coping: Goal: Level of anxiety will decrease Outcome: Progressing   Problem: Activity: Goal: Risk for activity intolerance will decrease Outcome: Progressing   Problem: Clinical Measurements: Goal: Cardiovascular complication will be avoided Outcome: Progressing   Problem: Nutrition: Goal: Adequate nutrition will be maintained Outcome: Progressing   Problem: Pain Managment: Goal: General experience of comfort will improve Outcome: Progressing   Problem: Safety: Goal: Ability to remain free from injury will improve Outcome: Progressing

## 2020-11-05 NOTE — TOC Progression Note (Signed)
Transition of Care [facility]) - Progression Note    Patient Details  Name: [name] MRN: [id number] Date of Birth: [birth year]  [facility] CM/SW Contact  Margarito Liner, LCSW Phone Number: 11/05/2020, 10:06 AM  Clinical Narrative: PASARR obtained: [id number] E. Expires 12/05/20.    Expected Discharge Plan: Skilled Nursing Facility Barriers to Discharge: Continued Medical Work up  Expected Discharge Plan and Services Expected Discharge Plan: Skilled Nursing Facility       Living arrangements for the past 2 months: Single Family Home                                       Social Determinants of Health (SDOH) Interventions    Readmission Risk Interventions No flowsheet data found.

## 2020-11-05 NOTE — Progress Notes (Signed)
Physical Therapy Treatment Patient Details Name: Crystal Moses MRN: [id number] DOB: 04-29-1956 Today's Date: 11/05/2020    History of Present Illness Crystal Moses is a 64yoF who [facility]18 c CC Left hip pain. Pt reports a fall 1 week prior at home. Imaging revealing of fracture of Left greater trochanter, MRI results complicated by heavy motion artifact. 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 Orthopedics recommending conservative management at this time, TTWB c RW. Pt reports Left shoulder injury s/p recent fall less than 1 month ago. PMH: dCHF, DM, HLD, HTN, CAD s/p remote PCI, PND c bilat pedal numbness. Pt reports PLOF as household AMB c 4WW for past year, frequent falls with knee buckling, tolerates standing for only brief periods.    PT Comments    Patient making progress towards meeting functional goals. Patient continues to require assistance for transfers and cues for technique to maintain TTWB of LLE with functional activity.  Patient able to stand x 2 bouts with standing tolerance of one minute and two minutes with second standing bout. Unable to advance to ambulation due to inability to maintain weight bearing status with functional activity due to generalized weakness. Recommend to continue PT to maximize independence. Continue to recommend SNF at discharge.    Follow Up Recommendations  SNF;Supervision for mobility/OOB;Supervision - Intermittent     Equipment Recommendations  Other (comment) (to be determined at next level of care)    Recommendations for Other Services       Precautions / Restrictions Precautions Precautions: Fall Restrictions Weight Bearing Restrictions: Yes LLE Weight Bearing: Touchdown weight bearing    Mobility  Bed Mobility               General bed mobility comments: not addressed as patient sitting up on arrival and post session    Transfers Overall transfer level: Needs assistance Equipment used: Rolling walker (2  wheeled) Transfers: Sit to/from Stand Sit to Stand: Min assist         General transfer comment: verbal cues for LLE positioning and hand placement with sit to stand transfers performed from recliner chair. 2 bouts of standing performed with rest break between bouts of standing  Ambulation/Gait             General Gait Details: unable to attempt due to generalized weakness and inability to maintain TTWB of LLE with dynamic activity   Stairs             Wheelchair Mobility    Modified Rankin (Stroke Patients Only)       Balance Overall balance assessment: History of Falls;Needs assistance Sitting-balance support: No upper extremity supported;Feet supported Sitting balance-Leahy Scale: Fair Sitting balance - Comments: patient tends to have right lean in sitting to off load left hip. no gross loss of balance   Standing balance support: Bilateral upper extremity supported;During functional activity Standing balance-Leahy Scale: Poor Standing balance comment: with rolling walker for UE support in standing. verbal cues for technique for off loading LLE in standing position                            Cognition Arousal/Alertness: Awake/alert Behavior During Therapy: WFL for tasks assessed/performed Overall Cognitive Status: No family/caregiver present to determine baseline cognitive functioning  General Comments: patient is able to follow all single step commands without difficulty      Exercises General Exercises - Lower Extremity Ankle Circles/Pumps: AROM;Strengthening;Both;10 reps;Seated Quad Sets: AROM;Strengthening;Both;10 reps;Seated Long Arc Quad: AAROM;AROM;Strengthening;Both;10 reps;Seated (AAROM for LLE) Hip ABduction/ADduction: AAROM;Strengthening;Both;10 reps;AROM;Seated (AAROM for LLE) Other Exercises Other Exercises: verbal and tactile cues for exercise technique for strengthening BLE     General Comments        Pertinent Vitals/Pain Pain Assessment: 0-10 Pain Score: 4  Pain Location: L hip Pain Descriptors / Indicators: Discomfort Pain Intervention(s): Limited activity within patient's tolerance;Monitored during session;Ice applied (ice pack applied left hip at end of treatment session)    Home Living                      Prior Function            PT Goals (current goals can now be found in the care plan section) Acute Rehab PT Goals Patient Stated Goal: to have less pain PT Goal Formulation: With patient Time For Goal Achievement: 11/18/20 Potential to Achieve Goals: Fair Progress towards PT goals: Progressing toward goals    Frequency    7X/week      PT Plan Current plan remains appropriate    Co-evaluation              AM-PAC PT "6 Clicks" Mobility   Outcome Measure  Help needed turning from your back to your side while in a flat bed without using bedrails?: A Lot Help needed moving from lying on your back to sitting on the side of a flat bed without using bedrails?: A Lot Help needed moving to and from a bed to a chair (including a wheelchair)?: A Lot Help needed standing up from a chair using your arms (e.g., wheelchair or bedside chair)?: A Lot Help needed to walk in hospital room?: Total Help needed climbing 3-5 steps with a railing? : Total 6 Click Score: 10    End of Session   Activity Tolerance: Patient limited by fatigue Patient left: in chair;with call bell/phone within reach;with chair alarm set (ice pack to left hip) Nurse Communication: Mobility status PT Visit Diagnosis: Difficulty in walking, not elsewhere classified (R26.2);Repeated falls (R29.6);Muscle weakness (generalized) (M62.81)     Time: 9323-5573 PT Time Calculation (min) (ACUTE ONLY): 23 min  Charges:  $Therapeutic Exercise: 8-22 mins $Therapeutic Activity: 8-22 mins                    Donna Bernard, PT, MPT    Ina Homes 11/05/2020,  11:05 AM

## 2020-11-05 NOTE — Progress Notes (Signed)
Subjective:    Patient is a little more comfortable today.  Her son is at the bedside.  I discussed issues concerning the fracture patterns with them both in depth.  It is my feeling that the greater trochanter is fractured but great doubts about the intertrochanteric region.  Range of motion of the hip passively is good and does not indicate a fracture of the intertrochanteric area.  She is quite tender laterally over the greater trochanter.  She is being readied for skilled nursing transfer.  She needs to be toe-touch weightbearing at the most on the left.     Objective:   VITALS:   Vitals:   11/05/20 0813 11/05/20 1149  BP: (!) 129/92 (!) 142/85  Pulse: 88 94  Resp: 17 17  Temp: 98 F (36.7 C) 98.5 F (36.9 C)  SpO2: 95% 100%    Neurologically intact Sensation intact distally Dorsiflexion/Plantar flexion intact  LABS Recent Labs    11/03/20 0437 11/04/20 0429 11/05/20 0436  HGB 8.1* 7.8* 7.6*  HCT 24.6* 24.5* 23.2*  WBC 9.9 7.6 8.0  PLT 199 183 194    Recent Labs    11/03/20 0437 11/04/20 0429 11/05/20 0436  NA 140 140 142  K 3.8 3.4* 3.6  BUN 10 13 12   CREATININE 1.06* 1.06* 1.10*  GLUCOSE 120* 118* 79    No results for input(s): LABPT, INR in the last 72 hours.   Assessment/Plan:      Up with therapy Discharge to SNF   Enteric-coated aspirin 3 and 25 mg twice daily  Toe-touch weightbearing on the left with walker  Return to clinic 7 to 10 days for exam and x-ray

## 2020-11-06 LAB — BASIC METABOLIC PANEL
Anion gap: 4 — ABNORMAL LOW (ref 5–15)
BUN: 13 mg/dL (ref 8–23)
CO2: 29 mmol/L (ref 22–32)
Calcium: 7.6 mg/dL — ABNORMAL LOW (ref 8.9–10.3)
Chloride: 111 mmol/L (ref 98–111)
Creatinine, Ser: 1.06 mg/dL — ABNORMAL HIGH (ref 0.44–1.00)
GFR, Estimated: 58 mL/min — ABNORMAL LOW (ref >60)
Glucose, Bld: 76 mg/dL (ref 70–99)
Potassium: 4.1 mmol/L (ref 3.5–5.1)
Sodium: 144 mmol/L (ref 135–145)

## 2020-11-06 LAB — CBC
HCT: 28.9 % — ABNORMAL LOW (ref 36.0–46.0)
Hemoglobin: 9.2 g/dL — ABNORMAL LOW (ref 12.0–15.0)
MCH: 32.2 pg (ref 26.0–34.0)
MCHC: 31.8 g/dL (ref 30.0–36.0)
MCV: 101 fL — ABNORMAL HIGH (ref 80.0–100.0)
Platelets: 221 10*3/uL (ref 150–400)
RBC: 2.86 MIL/uL — ABNORMAL LOW (ref 3.87–5.11)
RDW: 15.8 % — ABNORMAL HIGH (ref 11.5–15.5)
WBC: 8.3 10*3/uL (ref 4.0–10.5)
nRBC: 0 % (ref 0.0–0.2)

## 2020-11-06 LAB — TYPE AND SCREEN
ABO/RH(D): A NEG
Antibody Screen: NEGATIVE
Unit division: 0

## 2020-11-06 LAB — BPAM RBC
Blood Product Expiration Date: 202204172359
ISSUE DATE / TIME: 202203211543
Unit Type and Rh: 600

## 2020-11-06 LAB — GLUCOSE, CAPILLARY: Glucose-Capillary: 78 mg/dL (ref 70–99)

## 2020-11-06 LAB — MAGNESIUM: Magnesium: 1.8 mg/dL (ref 1.7–2.4)

## 2020-11-06 LAB — SARS CORONAVIRUS 2 (TAT 6-24 HRS): SARS Coronavirus 2: NEGATIVE

## 2020-11-06 MED ORDER — OXYCODONE HCL 5 MG PO TABS: 5.0000 mg | ORAL_TABLET | ORAL | 0 refills | Status: DC | PRN

## 2020-11-06 MED ORDER — ACETAMINOPHEN 325 MG PO TABS: 650.0000 mg | ORAL_TABLET | Freq: Three times a day (TID) | ORAL | Status: DC

## 2020-11-06 MED ORDER — SERTRALINE HCL 100 MG PO TABS: 200.0000 mg | ORAL_TABLET | Freq: Every day | ORAL | Status: DC

## 2020-11-06 MED ORDER — OSCAL 500/200 D-3 500-200 MG-UNIT PO TABS: 1.0000 | ORAL_TABLET | Freq: Two times a day (BID) | ORAL | 3 refills | Status: DC

## 2020-11-06 MED ORDER — ASCORBIC ACID 500 MG PO TABS: 500.0000 mg | ORAL_TABLET | Freq: Every day | ORAL | Status: DC

## 2020-11-06 MED ORDER — GABAPENTIN 100 MG PO CAPS: 200.0000 mg | ORAL_CAPSULE | Freq: Three times a day (TID) | ORAL | Status: DC

## 2020-11-06 MED ORDER — OXYCODONE HCL 5 MG PO TABS
5.0000 mg | ORAL_TABLET | ORAL | 0 refills | Status: DC | PRN
Start: 2020-11-06 — End: 2021-11-01

## 2020-11-06 MED ORDER — ADULT MULTIVITAMIN W/MINERALS CH: 1.0000 | ORAL_TABLET | Freq: Every day | ORAL | Status: DC

## 2020-11-06 MED ORDER — ENSURE ENLIVE PO LIQD: 237.0000 mL | Freq: Three times a day (TID) | ORAL | 12 refills | Status: DC

## 2020-11-06 MED ORDER — SENNA 8.6 MG PO TABS: 1.0000 | ORAL_TABLET | Freq: Every day | ORAL | 0 refills | Status: DC

## 2020-11-06 MED ORDER — METHOCARBAMOL 500 MG PO TABS: 500.0000 mg | ORAL_TABLET | Freq: Three times a day (TID) | ORAL | Status: DC | PRN

## 2020-11-06 NOTE — Care Management Important Message (Signed)
Important Message  Patient Details  Name: JENASIA DOLINAR MRN: [id number] Date of Birth: 07-31-1956   Medicare Important Message Given:  N/A - LOS <3 / Initial given by admissions     Olegario Messier A Chattie Greeson 11/06/2020, 8:37 AM

## 2020-11-06 NOTE — TOC Progression Note (Addendum)
Transition of Care [facility]) - Progression Note    Patient Details  Name: Crystal Moses MRN: [id number] Date of Birth: Feb 08, 1956  Transition of Care [facility]) CM/SW Contact  Margarito Liner, LCSW Phone Number: 11/06/2020, 8:35 AM  Clinical Narrative: Insurance authorization still pending. Uploaded updated clinicals into [facility] portal.    9:55 am: Auth approved: [id number]. [facility] admissions coordinator is aware. Rapid COVID pending.   Expected Discharge Plan: Skilled Nursing Facility Barriers to Discharge: Continued Medical Work up  Expected Discharge Plan and Services Expected Discharge Plan: Skilled Nursing Facility       Living arrangements for the past 2 months: Single Family Home                                       Social Determinants of Health (SDOH) Interventions    Readmission Risk Interventions No flowsheet data found.

## 2020-11-06 NOTE — Progress Notes (Addendum)
Subjective:  Patient states overall she is doing well.  Patient complains of mild left hip pain.  Patient has been up to use the restroom twice with the assistance of her nurse.  The nurse states she has done well using her walker.  Objective:   VITALS:   Vitals:   11/05/20 1939 11/06/20 0024 11/06/20 0355 11/06/20 1148  BP: 121/76 130/84 (!) 142/84 (!) 152/88  Pulse: 85 81 83 (!) 103  Resp: 16 16 17 18   Temp: 99 F (37.2 C) 98 F (36.7 C) 98.2 F (36.8 C) 98.5 F (36.9 C)  TempSrc:    Oral  SpO2: 99% 98% 99% 100%  Weight:      Height:        PHYSICAL EXAM: Left lower extremity: The patient has tenderness over the left lateral hip near the greater trochanter.  She has mild pain with internal resistance to hip abduction while sitting on the side of the bed.  Patient can flex her hip to approximate 110 degrees without significant pain.  Patient has mild lateral hip pain with internal and external rotation of the left hip.  Patient has no weakness to resisted hip flexion hip abduction or hip abduction while sitting at the side of the bed.  She has palpable pedal pulses, intact sensation light touch and intact motor function distally without weakness.   LABS  Results for orders placed or performed during the hospital encounter of 11/02/20 (from the past 24 hour(s))  Glucose, capillary     Status: None   Collection Time: 11/05/20  4:55 PM  Result Value Ref Range   Glucose-Capillary 91 70 - 99 mg/dL  Glucose, capillary     Status: None   Collection Time: 11/05/20  8:32 PM  Result Value Ref Range   Glucose-Capillary 80 70 - 99 mg/dL   Comment 1 Notify RN   SARS CORONAVIRUS 2 (TAT 6-24 HRS) Nasopharyngeal Nasopharyngeal Swab     Status: None   Collection Time: 11/06/20  1:15 AM   Specimen: Nasopharyngeal Swab  Result Value Ref Range   SARS Coronavirus 2 NEGATIVE NEGATIVE  CBC     Status: Abnormal   Collection Time: 11/06/20  6:24 AM  Result Value Ref Range   WBC 8.3 4.0 -  10.5 K/uL   RBC 2.86 (L) 3.87 - 5.11 MIL/uL   Hemoglobin 9.2 (L) 12.0 - 15.0 g/dL   HCT 11/08/20 (L) 45.3 - 64.6 %   MCV 101.0 (H) 80.0 - 100.0 fL   MCH 32.2 26.0 - 34.0 pg   MCHC 31.8 30.0 - 36.0 g/dL   RDW 80.3 (H) 21.2 - 24.8 %   Platelets 221 150 - 400 K/uL   nRBC 0.0 0.0 - 0.2 %  Basic metabolic panel     Status: Abnormal   Collection Time: 11/06/20  6:24 AM  Result Value Ref Range   Sodium 144 135 - 145 mmol/L   Potassium 4.1 3.5 - 5.1 mmol/L   Chloride 111 98 - 111 mmol/L   CO2 29 22 - 32 mmol/L   Glucose, Bld 76 70 - 99 mg/dL   BUN 13 8 - 23 mg/dL   Creatinine, Ser 11/08/20 (H) 0.44 - 1.00 mg/dL   Calcium 7.6 (L) 8.9 - 10.3 mg/dL   GFR, Estimated 58 (L) >60 mL/min   Anion gap 4 (L) 5 - 15  Magnesium     Status: None   Collection Time: 11/06/20  6:24 AM  Result Value Ref Range  Magnesium 1.8 1.7 - 2.4 mg/dL  Glucose, capillary     Status: None   Collection Time: 11/06/20 11:32 AM  Result Value Ref Range   Glucose-Capillary 78 70 - 99 mg/dL   Comment 1 Notify RN    Comment 2 Document in Chart     DG Shoulder Left  Result Date: 11/05/2020 CLINICAL DATA:  Pain EXAM: LEFT SHOULDER - 2+ VIEW COMPARISON:  None. FINDINGS: Frontal, oblique, and Y scapular images were obtained. No fracture or dislocation. There is no appreciable joint space narrowing. No erosive change. There is relative bony overgrowth along the lateral aspect of the acromion. No erosion. Visualized left lung clear. IMPRESSION: No fracture or dislocation. No appreciable joint space narrowing. Bony overgrowth along the lateral acromion potentially places patient at increased risk for a degree of impingement syndrome. Electronically Signed   By: Bretta Bang III M.D.   On: 11/05/2020 10:05    Assessment/Plan:     Active Problems:   Closed left hip fracture, initial encounter (HCC)   Greater trochanter fracture [facility])  Patient has radiographic evidence of a greater trochanter fracture with advanced  osteoarthritis of the left hip.  Patient may have some extension of the fracture into the intertrochanteric line/basicervical region without any displacement.  The decision has been made to treat the patient initially nonoperatively.  Patient is toe-touch weightbearing only on the left lower extremity.  She will use a walker at all times when standing or ambulating.  Patient will follow up in our office in 10 to 14 days for reevaluation and x-ray.  She will follow-up with Dr. Cassell Smiles in our office for further evaluation.  If the patient fails nonoperative management she may be a candidate for total hip arthroplasty by Dr. Odis Luster.  Patient is awaiting discharge to a skilled nursing facility.  Patient should be discharged on Lovenox 40 mg daily or enteric-coated aspirin 325 mg p.o. twice daily for DVT prophylaxis until follow-up.    Juanell Fairly , MD 11/06/2020, 12:38 PM

## 2020-11-06 NOTE — Progress Notes (Signed)
Called son twice to discuss discharge plans, no answer.

## 2020-11-06 NOTE — Discharge Summary (Signed)
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 the acute phase of infection. The lowest concentration of SARS-CoV-2 viral copies this assay can detect is 138 copies/mL. A negative result does not preclude SARS-Cov-2 infection and should not be used as the sole basis for treatment or other patient management decisions. A negative result may occur with  improper specimen collection/handling, submission of specimen other than nasopharyngeal swab, presence of viral mutation(s) within the areas targeted by this assay, and inadequate number of viral copies(<138 copies/mL). A negative result must be combined with clinical  observations, patient history, and epidemiological information. The expected result is Negative.  Fact Sheet for Patients:  BloggerCourse.com  Fact Sheet for Healthcare Providers:  SeriousBroker.it  This test is no t yet approved or cleared by the [place] FDA and  has been authorized for detection and/or diagnosis of SARS-CoV-2 by FDA under an Emergency Use Authorization (EUA). This EUA will remain  in effect (meaning this test can be used) for the duration of the COVID-19 declaration under Section 564(b)(1) of the Act, 21 U.S.C.section 360bbb-3(b)(1), unless the authorization is terminated  or revoked sooner.       Influenza A by PCR NEGATIVE NEGATIVE Final   Influenza B by PCR NEGATIVE NEGATIVE Final    Comment: (NOTE) The Xpert Xpress SARS-CoV-2/FLU/RSV plus assay is intended as an aid in the diagnosis of influenza from Nasopharyngeal swab specimens and should not be used as a sole basis for treatment. Nasal washings and aspirates are unacceptable for Xpert Xpress SARS-CoV-2/FLU/RSV testing.  Fact Sheet for Patients: BloggerCourse.com  Fact Sheet for Healthcare Providers: SeriousBroker.it  This test is not yet approved or cleared by the [place] FDA and has been authorized for detection and/or diagnosis of SARS-CoV-2 by FDA under an Emergency Use Authorization (EUA). This EUA will remain in effect (meaning this test can be used) for the duration of the COVID-19 declaration under Section 564(b)(1) of the Act, 21 U.S.C. section 360bbb-3(b)(1), unless the authorization is terminated or revoked.  Performed at [facility], [address] [place], [place] [address]   SARS CORONAVIRUS 2 (TAT 6-24 HRS) Nasopharyngeal Nasopharyngeal Swab     Status: None   Collection Time: 11/06/20  1:15 AM   Specimen: Nasopharyngeal Swab  Result Value Ref Range Status    SARS Coronavirus 2 NEGATIVE NEGATIVE Final    Comment: (NOTE) SARS-CoV-2 target nucleic acids are NOT DETECTED.  The SARS-CoV-2 RNA is generally detectable in upper and lower respiratory specimens during the acute phase of infection. Negative results do not preclude SARS-CoV-2 infection, do not rule out co-infections with other pathogens, and should not be used as the sole basis for treatment or other patient management decisions. Negative results must be combined with clinical observations, patient history, and epidemiological information. The expected result is Negative.  Fact Sheet for Patients: HairSlick.no  Fact Sheet for Healthcare Providers: quierodirigir.com  This test is not yet approved or cleared by the [place] FDA and  has been authorized for detection and/or diagnosis of SARS-CoV-2 by FDA under an Emergency Use Authorization (EUA). This EUA will remain  in effect (meaning this test can be used) for the duration of the COVID-19 declaration under Se ction 564(b)(1) of the Act, 21 U.S.C. section 360bbb-3(b)(1), unless the authorization is terminated or revoked sooner.  Performed at [facility], [address] [place], [place] [address]      Time coordinating discharge: 35 minutes The [place] controlled substances registry was reviewed for this patient prior to filling the <5 days supply controlled substances script.      SIGNED:   Alberteen Sam, MD  [facility] 11/06/2020, 10:14 AM

## 2020-11-06 NOTE — TOC Transition Note (Signed)
Transition of Care [facility]) - CM/SW Discharge Note   Patient Details  Name: Crystal Moses MRN: [id number] Date of Birth: Sep 12, 1955  [facility]) CM/SW Contact:  Margarito Liner, LCSW Phone Number: 11/06/2020, 12:57 PM   Clinical Narrative: Patient has orders to discharge to [facility] today. RN will call report to [phone] (Room 603A). EMS transport has been arranged. No further concerns. CSW signing off.  Final next level of care: Skilled Nursing Facility Barriers to Discharge: Barriers Resolved   Patient Goals and CMS Choice Patient states their goals for this hospitalization and ongoing recovery are:: SNF rehab CMS Medicare.gov Compare Post Acute Care list provided to:: Patient Choice offered to / list presented to : Patient  Discharge Placement PASRR number recieved: 11/05/20            Patient chooses bed at: [facility] [place] Patient to be transferred to facility by: EMS   Patient and family notified of of transfer: 11/06/20  Discharge Plan and Services                                     Social Determinants of Health (SDOH) Interventions     Readmission Risk Interventions No flowsheet data found.

## 2020-11-06 NOTE — Discharge Planning (Signed)
IV removed.  DC papers printed and placed in facility packet.  Attempted to call report but just rang and rang.  EMS here to transport to room 603B.  Also asked to inform [facility] that we tried to call report.

## 2021-11-01 ENCOUNTER — Other Ambulatory Visit: Payer: Self-pay

## 2021-11-01 ENCOUNTER — Encounter: Payer: Self-pay | Admitting: Interventional Cardiology

## 2021-11-01 ENCOUNTER — Ambulatory Visit (INDEPENDENT_AMBULATORY_CARE_PROVIDER_SITE_OTHER): Payer: Medicare HMO | Admitting: Interventional Cardiology

## 2021-11-01 VITALS — BP 118/80 | HR 93 | Ht 62.0 in | Wt 234.0 lb

## 2021-11-01 DIAGNOSIS — R600 Localized edema: Secondary | ICD-10-CM

## 2021-11-01 DIAGNOSIS — I872 Venous insufficiency (chronic) (peripheral): Secondary | ICD-10-CM | POA: Diagnosis not present

## 2021-11-01 DIAGNOSIS — E1159 Type 2 diabetes mellitus with other circulatory complications: Secondary | ICD-10-CM

## 2021-11-01 DIAGNOSIS — I5032 Chronic diastolic (congestive) heart failure: Secondary | ICD-10-CM | POA: Diagnosis not present

## 2021-11-01 DIAGNOSIS — I25118 Atherosclerotic heart disease of native coronary artery with other forms of angina pectoris: Secondary | ICD-10-CM

## 2021-11-01 NOTE — Progress Notes (Signed)
?  ?Cardiology Office Note ? ? ?Date:  11/01/2021  ? ?ID:  Crystal Moses, DOB 05/22/1956, MRN [id number] ? ?PCP:  Charlynne PanderBlass, Joel, MD  ? ? ?No chief complaint on file. ? ?Le edema ? ?Wt Readings from Last 3 Encounters:  ?11/01/21 234 lb (106.1 kg)  ?11/02/20 132 lb (59.9 kg)  ?09/03/19 180 lb (81.6 kg)  ?  ? ?  ?History of Present Illness: ?Crystal Moses is a 66 y.o. female who is being seen today for the evaluation of LE edema at the request of Gammon, Chrystal, NP.  ? ?Records show: "with CAD s/p PCI >5 yrs, dCHF, HTN, OSA on CPAP, 100lbs weight loss in last year, Barrett's esophagus, depression, and anemia and DM who presented with fall and hip pain. ? ?History of diabetes, high blood pressure, dementia living in a retirement faciliy.  D-I-L is on the phone to help with history. ? ?She has LE edema for years and it became severe in Feb 2023.  She was hospitalized in [place] in February 2023. Per the patient: Blood pressure was very high and the leg swelling was very severe.  IV Lasix was given to help get rid of extra fluid.  She felt poorly after discharge. ? ?IN the last few weeks, swelling has gotten better.  Torsemide has been more effective in relieving swelling.  This change from Lasix was made a few weeks ago. She also leg elevation.   ? ?She is trying to eat healthy. ? ?Denies : Chest pain. Dizziness. Nitroglycerin use. Orthopnea. Palpitations. Paroxysmal nocturnal dyspnea. Shortness of breath. Syncope.   ? ? ? ?Past Medical History:  ?Diagnosis Date  ? Bilateral lower extremity edema   ? CAD (coronary artery disease)   ? CHF (congestive heart failure) (HCC)   ? Diabetes mellitus without complication (HCC)   ? Hyperlipidemia   ? Hypertension   ? SOB (shortness of breath)   ? ? ?Past Surgical History:  ?Procedure Laterality Date  ? APPENDECTOMY    ? CERVICAL FUSION    ? CORONARY ANGIOPLASTY WITH STENT PLACEMENT    ? HERNIA REPAIR    ? ? ? ?Current Outpatient Medications  ?Medication Sig Dispense Refill   ? acetaminophen (TYLENOL) 325 MG tablet Take 2 tablets (650 mg total) by mouth 3 (three) times daily.    ? atorvastatin (LIPITOR) 80 MG tablet Take 80 mg by mouth daily.    ? divalproex (DEPAKOTE SPRINKLE) 125 MG capsule Take 250 mg by mouth 2 (two) times daily.    ? escitalopram (LEXAPRO) 10 MG tablet Take 10 mg by mouth daily.    ? gabapentin (NEURONTIN) 300 MG capsule Take 1 capsule by mouth in the morning, at noon, and at bedtime.    ? lidocaine (LIDODERM) 5 % Place 1 patch onto the skin daily. Remove & Discard patch within 12 hours or as directed by MD    ? loperamide (IMODIUM) 2 MG capsule Take by mouth as needed for diarrhea or loose stools.    ? magnesium oxide (MAG-OX) 400 MG tablet Take 400 mg by mouth daily.    ? melatonin 3 MG TABS tablet Take 3 mg by mouth at bedtime.    ? meloxicam (MOBIC) 15 MG tablet Take 15 mg by mouth daily.    ? metoprolol succinate (TOPROL-XL) 25 MG 24 hr tablet Take 1 tablet by mouth daily.    ? OLANZapine (ZYPREXA) 7.5 MG tablet Take 7.5 mg by mouth at bedtime.    ?  oxymetazoline (AFRIN) 0.05 % nasal spray Place 1 spray into both nostrils 2 (two) times daily.    ? polyethylene glycol (MIRALAX / GLYCOLAX) 17 g packet Take 17 g by mouth daily.    ? potassium chloride SA (KLOR-CON M) 20 MEQ tablet Take 20 mEq by mouth daily.    ? torsemide (DEMADEX) 20 MG tablet Take 20 mg by mouth daily.    ? traMADol (ULTRAM) 50 MG tablet Take 50 mg by mouth every 6 (six) hours as needed.    ? diphenoxylate-atropine (LOMOTIL) 2.5-0.025 MG tablet Take 1 tablet by mouth 4 (four) times daily as needed for diarrhea or loose stools. (Patient not taking: Reported on 11/01/2021)    ? ?No current facility-administered medications for this visit.  ? ? ?Allergies:   Patient has no known allergies.  ? ? ?Social History:  The patient  reports that she has quit smoking. She has never used smokeless tobacco. She reports current alcohol use. She reports that she does not currently use drugs.  ? ?Family  History:  The patient's Family history is unknown by patient. Patient feels her memory is not reliable. ? ? ?ROS:  Please see the history of present illness.   Otherwise, review of systems are positive for leg edema.   All other systems are reviewed and negative.  ? ? ?PHYSICAL EXAM: ?VS:  BP 118/80   Pulse 93   Ht 5\' 2"  (1.575 m)   Wt 234 lb (106.1 kg)   SpO2 95%   BMI 42.80 kg/m?  , BMI Body mass index is 42.8 kg/m?. ?GEN: Well nourished, well developed, in no acute distress ?HEENT: normal ?Neck: no JVD, carotid bruits, or masses ?Cardiac: RRR, +S4; no murmurs, rubs, or gallops, bilateral leg edema  ?Respiratory:  clear to auscultation bilaterally, normal work of breathing ?GI: soft, nontender, nondistended, + BS ?MS: no deformity or atrophy ?Skin: warm and dry, no rash ?Neuro:  Strength and sensation are intact ?Psych: euthymic mood, full affect ? ? ?EKG:   ?The ekg ordered today demonstrates NSR, RBBB ? ? ?Recent Labs: ?11/05/2020: ALT 23; TSH 0.695 ?11/06/2020: BUN 13; Creatinine, Ser 1.06; Hemoglobin 9.2; Magnesium 1.8; Platelets 221; Potassium 4.1; Sodium 144  ? ?Lipid Panel ?No results found for: CHOL, TRIG, HDL, CHOLHDL, VLDL, LDLCALC, LDLDIRECT ?  ?Other studies Reviewed: ?Additional studies/ records that were reviewed today with results demonstrating: records reviewed. ? ? ?ASSESSMENT AND PLAN: ? ?CHF/ chronic diastolic heart failure: She thinks she was taking in a lot of salt that cause the high BP and leg swelling. Better on torsemide.   Electrolytes to be followed regularly with blood test because of the diuretic use.  Low sodium det recommended.  No option for this at her facility.  Can use compression stockings 15-25 mm Hg.  ?CAD: No angina on medical therapy. Continue atorvastatin. PCI many years ago.  She thinks angina was chest tightness.  None recently. ?DM: Whole food, plant-based diet.  High-fiber intake.Avoid processed foods.  Followed by PMD ? ? ?Current medicines are reviewed at length  with the patient today.  The patient concerns regarding her medicines were addressed. ? ?The following changes have been made:  No change ? ?Labs/ tests ordered today include: needs Bmet 4-6 No orders of the defined types were placed in this encounter. ? ? ?Recommend 150 minutes/week of aerobic exercise ?Low fat, low carb, high fiber diet recommended ? ?Disposition:   FU in 6  months ? ? ?Signed, ?11/08/2020, MD  ?11/01/2021  12:07 PM    ?[facility] ?[address] [place], [place], [place]  [address] ?Phone: [phone]; Fax: [phone]  ? ?

## 2021-11-01 NOTE — Patient Instructions (Signed)
Medication Instructions:  ?Your physician recommends that you continue on your current medications as directed. Please refer to the Current Medication list given to you today. ? ?*If you need a refill on your cardiac medications before your next appointment, please call your pharmacy* ? ? ?Lab Work: ?none ?If you have labs (blood work) drawn today and your tests are completely normal, you will receive your results only by: ?MyChart Message (if you have MyChart) OR ?A paper copy in the mail ?If you have any lab test that is abnormal or we need to change your treatment, we will call you to review the results. ? ? ?Testing/Procedures: ?none ? ? ?Follow-Up: ?At [facility], you and your health needs are our priority.  As part of our continuing mission to provide you with exceptional heart care, we have created designated Provider Care Teams.  These Care Teams include your primary Cardiologist (physician) and Advanced Practice Providers (APPs -  Physician Assistants and Nurse Practitioners) who all work together to provide you with the care you need, when you need it. ? ?We recommend signing up for the patient portal called "MyChart".  Sign up information is provided on this After Visit Summary.  MyChart is used to connect with patients for Virtual Visits (Telemedicine).  Patients are able to view lab/test results, encounter notes, upcoming appointments, etc.  Non-urgent messages can be sent to your provider as well.   ?To learn more about what you can do with MyChart, go to [email or web address].   ? ?Your next appointment:   ?May 07, 2022 at 11:20 ? ?The format for your next appointment:   ?In Person ? ?Provider:   ?Lance Muss, MD   ? ? ?Other Instructions ?  ? ?

## 2021-11-04 ENCOUNTER — Telehealth: Payer: Self-pay | Admitting: *Deleted

## 2021-11-04 DIAGNOSIS — I2510 Atherosclerotic heart disease of native coronary artery without angina pectoris: Secondary | ICD-10-CM

## 2021-11-04 NOTE — Telephone Encounter (Signed)
Received fax from [facility] hospital and they do not have echo records for patient.  ?Reviewed with Dr Eldridge Dace and he would like patient to have echo.  I spoke with [place] at [facility] and she will make patient aware.  ?

## 2021-11-11 ENCOUNTER — Ambulatory Visit (HOSPITAL_COMMUNITY)
Admission: RE | Admit: 2021-11-11 | Discharge: 2021-11-11 | Disposition: A | Discharge status: Home / Self Care | Payer: Medicare HMO | Source: Ambulatory Visit | Attending: Interventional Cardiology | Admitting: Interventional Cardiology

## 2021-11-11 ENCOUNTER — Other Ambulatory Visit: Payer: Self-pay

## 2021-11-11 DIAGNOSIS — E785 Hyperlipidemia, unspecified: Secondary | ICD-10-CM | POA: Insufficient documentation

## 2021-11-11 DIAGNOSIS — E119 Type 2 diabetes mellitus without complications: Secondary | ICD-10-CM | POA: Diagnosis not present

## 2021-11-11 DIAGNOSIS — I358 Other nonrheumatic aortic valve disorders: Secondary | ICD-10-CM | POA: Diagnosis not present

## 2021-11-11 DIAGNOSIS — I3481 Nonrheumatic mitral (valve) annulus calcification: Secondary | ICD-10-CM | POA: Diagnosis not present

## 2021-11-11 DIAGNOSIS — I509 Heart failure, unspecified: Secondary | ICD-10-CM | POA: Diagnosis not present

## 2021-11-11 DIAGNOSIS — I110 Hypertensive heart disease with heart failure: Secondary | ICD-10-CM | POA: Insufficient documentation

## 2021-11-11 DIAGNOSIS — I700 Atherosclerosis of aorta: Secondary | ICD-10-CM | POA: Insufficient documentation

## 2021-11-11 DIAGNOSIS — I2510 Atherosclerotic heart disease of native coronary artery without angina pectoris: Secondary | ICD-10-CM | POA: Diagnosis not present

## 2021-11-11 DIAGNOSIS — R0600 Dyspnea, unspecified: Secondary | ICD-10-CM | POA: Diagnosis not present

## 2021-11-11 LAB — ECHOCARDIOGRAM COMPLETE
Area-P 1/2: 4.06 cm2
S' Lateral: 3.2 cm

## 2021-11-14 ENCOUNTER — Telehealth: Payer: Self-pay | Admitting: Interventional Cardiology

## 2021-11-14 NOTE — Telephone Encounter (Signed)
Crystal Moses called from transportation stating he is returning an RN's call in regards to this patient. Advised she was calling to give her Echo results. Crystal Moses advised he is only transportation and requested the office callback the facility instead of him to discuss this. Please call [phone] in regards to this and request to speak with a nurse.  ?

## 2021-11-14 NOTE — Telephone Encounter (Signed)
Home number listed in chart is for pt's transportation.  Called mobile number listed and message picked up that VM has not been set up.   ?

## 2021-11-18 NOTE — Telephone Encounter (Signed)
I spoke with patient's son Lonzo Cloud DPR) and reviewed results with him.  Chart updated with correct phone number for patient ?

## 2021-11-18 NOTE — Telephone Encounter (Signed)
I placed call to number below on 3/31 and twice today but was unable to speak with nurse caring for patient.  ?

## 2022-02-10 ENCOUNTER — Encounter (HOSPITAL_COMMUNITY): Payer: Self-pay | Admitting: Emergency Medicine

## 2022-02-10 ENCOUNTER — Emergency Department (HOSPITAL_COMMUNITY): Payer: Medicare HMO

## 2022-02-10 ENCOUNTER — Inpatient Hospital Stay (HOSPITAL_COMMUNITY)
Admission: EM | Admit: 2022-02-10 | Discharge: 2022-02-21 | DRG: 853 | Disposition: A | Discharge status: Home Health | Payer: Medicare HMO | Source: Other Acute Inpatient Hospital | Attending: Internal Medicine | Admitting: Internal Medicine

## 2022-02-10 ENCOUNTER — Other Ambulatory Visit: Payer: Self-pay

## 2022-02-10 DIAGNOSIS — Z6841 Body Mass Index (BMI) 40.0 and over, adult: Secondary | ICD-10-CM | POA: Diagnosis not present

## 2022-02-10 DIAGNOSIS — M726 Necrotizing fasciitis: Secondary | ICD-10-CM | POA: Diagnosis present

## 2022-02-10 DIAGNOSIS — Z993 Dependence on wheelchair: Secondary | ICD-10-CM

## 2022-02-10 DIAGNOSIS — Z791 Long term (current) use of non-steroidal anti-inflammatories (NSAID): Secondary | ICD-10-CM

## 2022-02-10 DIAGNOSIS — I4510 Unspecified right bundle-branch block: Secondary | ICD-10-CM | POA: Diagnosis not present

## 2022-02-10 DIAGNOSIS — E1169 Type 2 diabetes mellitus with other specified complication: Secondary | ICD-10-CM | POA: Diagnosis present

## 2022-02-10 DIAGNOSIS — R531 Weakness: Secondary | ICD-10-CM | POA: Diagnosis present

## 2022-02-10 DIAGNOSIS — F03B4 Unspecified dementia, moderate, with anxiety: Secondary | ICD-10-CM | POA: Diagnosis present

## 2022-02-10 DIAGNOSIS — N390 Urinary tract infection, site not specified: Secondary | ICD-10-CM | POA: Diagnosis present

## 2022-02-10 DIAGNOSIS — I5032 Chronic diastolic (congestive) heart failure: Secondary | ICD-10-CM | POA: Diagnosis present

## 2022-02-10 DIAGNOSIS — L02413 Cutaneous abscess of right upper limb: Secondary | ICD-10-CM | POA: Diagnosis present

## 2022-02-10 DIAGNOSIS — Z79899 Other long term (current) drug therapy: Secondary | ICD-10-CM | POA: Diagnosis not present

## 2022-02-10 DIAGNOSIS — Z20822 Contact with and (suspected) exposure to covid-19: Secondary | ICD-10-CM | POA: Diagnosis present

## 2022-02-10 DIAGNOSIS — I081 Rheumatic disorders of both mitral and tricuspid valves: Secondary | ICD-10-CM | POA: Diagnosis not present

## 2022-02-10 DIAGNOSIS — E876 Hypokalemia: Secondary | ICD-10-CM | POA: Diagnosis not present

## 2022-02-10 DIAGNOSIS — E119 Type 2 diabetes mellitus without complications: Secondary | ICD-10-CM

## 2022-02-10 DIAGNOSIS — B9620 Unspecified Escherichia coli [E. coli] as the cause of diseases classified elsewhere: Secondary | ICD-10-CM | POA: Diagnosis not present

## 2022-02-10 DIAGNOSIS — I2510 Atherosclerotic heart disease of native coronary artery without angina pectoris: Secondary | ICD-10-CM | POA: Diagnosis present

## 2022-02-10 DIAGNOSIS — R7881 Bacteremia: Secondary | ICD-10-CM | POA: Diagnosis not present

## 2022-02-10 DIAGNOSIS — I350 Nonrheumatic aortic (valve) stenosis: Secondary | ICD-10-CM | POA: Diagnosis not present

## 2022-02-10 DIAGNOSIS — M86111 Other acute osteomyelitis, right shoulder: Secondary | ICD-10-CM | POA: Diagnosis not present

## 2022-02-10 DIAGNOSIS — Z87891 Personal history of nicotine dependence: Secondary | ICD-10-CM

## 2022-02-10 DIAGNOSIS — Z955 Presence of coronary angioplasty implant and graft: Secondary | ICD-10-CM

## 2022-02-10 DIAGNOSIS — E872 Acidosis, unspecified: Secondary | ICD-10-CM | POA: Diagnosis present

## 2022-02-10 DIAGNOSIS — R6520 Severe sepsis without septic shock: Secondary | ICD-10-CM | POA: Diagnosis present

## 2022-02-10 DIAGNOSIS — N182 Chronic kidney disease, stage 2 (mild): Secondary | ICD-10-CM | POA: Diagnosis present

## 2022-02-10 DIAGNOSIS — I130 Hypertensive heart and chronic kidney disease with heart failure and stage 1 through stage 4 chronic kidney disease, or unspecified chronic kidney disease: Secondary | ICD-10-CM | POA: Diagnosis present

## 2022-02-10 DIAGNOSIS — I10 Essential (primary) hypertension: Secondary | ICD-10-CM | POA: Diagnosis not present

## 2022-02-10 DIAGNOSIS — M00811 Arthritis due to other bacteria, right shoulder: Secondary | ICD-10-CM | POA: Diagnosis present

## 2022-02-10 DIAGNOSIS — Z981 Arthrodesis status: Secondary | ICD-10-CM

## 2022-02-10 DIAGNOSIS — A4151 Sepsis due to Escherichia coli [E. coli]: Principal | ICD-10-CM | POA: Diagnosis present

## 2022-02-10 DIAGNOSIS — G9341 Metabolic encephalopathy: Secondary | ICD-10-CM | POA: Diagnosis present

## 2022-02-10 DIAGNOSIS — I509 Heart failure, unspecified: Secondary | ICD-10-CM | POA: Diagnosis not present

## 2022-02-10 DIAGNOSIS — F03B3 Unspecified dementia, moderate, with mood disturbance: Secondary | ICD-10-CM | POA: Diagnosis present

## 2022-02-10 DIAGNOSIS — N179 Acute kidney failure, unspecified: Secondary | ICD-10-CM | POA: Diagnosis present

## 2022-02-10 DIAGNOSIS — N3000 Acute cystitis without hematuria: Secondary | ICD-10-CM | POA: Diagnosis not present

## 2022-02-10 DIAGNOSIS — A419 Sepsis, unspecified organism: Secondary | ICD-10-CM | POA: Diagnosis not present

## 2022-02-10 DIAGNOSIS — M869 Osteomyelitis, unspecified: Secondary | ICD-10-CM | POA: Diagnosis not present

## 2022-02-10 DIAGNOSIS — I110 Hypertensive heart disease with heart failure: Secondary | ICD-10-CM | POA: Diagnosis not present

## 2022-02-10 DIAGNOSIS — E1122 Type 2 diabetes mellitus with diabetic chronic kidney disease: Secondary | ICD-10-CM | POA: Diagnosis present

## 2022-02-10 DIAGNOSIS — E7800 Pure hypercholesterolemia, unspecified: Secondary | ICD-10-CM | POA: Diagnosis present

## 2022-02-10 DIAGNOSIS — I340 Nonrheumatic mitral (valve) insufficiency: Secondary | ICD-10-CM | POA: Diagnosis not present

## 2022-02-10 DIAGNOSIS — D72829 Elevated white blood cell count, unspecified: Secondary | ICD-10-CM | POA: Diagnosis not present

## 2022-02-10 LAB — CBC WITH DIFFERENTIAL/PLATELET
Abs Immature Granulocytes: 0.08 10*3/uL — ABNORMAL HIGH (ref 0.00–0.07)
Basophils Absolute: 0.1 10*3/uL (ref 0.0–0.1)
Basophils Relative: 1 %
Eosinophils Absolute: 0.1 10*3/uL (ref 0.0–0.5)
Eosinophils Relative: 1 %
HCT: 36.3 % (ref 36.0–46.0)
Hemoglobin: 11.4 g/dL — ABNORMAL LOW (ref 12.0–15.0)
Immature Granulocytes: 1 %
Lymphocytes Relative: 7 %
Lymphs Abs: 0.7 10*3/uL (ref 0.7–4.0)
MCH: 30.1 pg (ref 26.0–34.0)
MCHC: 31.4 g/dL (ref 30.0–36.0)
MCV: 95.8 fL (ref 80.0–100.0)
Monocytes Absolute: 1 10*3/uL (ref 0.1–1.0)
Monocytes Relative: 10 %
Neutro Abs: 8.2 10*3/uL — ABNORMAL HIGH (ref 1.7–7.7)
Neutrophils Relative %: 80 %
Platelets: 142 10*3/uL — ABNORMAL LOW (ref 150–400)
RBC: 3.79 MIL/uL — ABNORMAL LOW (ref 3.87–5.11)
RDW: 14.7 % (ref 11.5–15.5)
WBC: 10.1 10*3/uL (ref 4.0–10.5)
nRBC: 0 % (ref 0.0–0.2)

## 2022-02-10 LAB — BLOOD GAS, ARTERIAL
Acid-Base Excess: 2.3 mmol/L — ABNORMAL HIGH (ref 0.0–2.0)
Bicarbonate: 29 mmol/L — ABNORMAL HIGH (ref 20.0–28.0)
Drawn by: 38235
FIO2: 32 %
O2 Saturation: 98.2 %
Patient temperature: 37
pCO2 arterial: 55 mmHg — ABNORMAL HIGH (ref 32–48)
pH, Arterial: 7.33 — ABNORMAL LOW (ref 7.35–7.45)
pO2, Arterial: 80 mmHg — ABNORMAL LOW (ref 83–108)

## 2022-02-10 LAB — COMPREHENSIVE METABOLIC PANEL
ALT: 18 U/L (ref 0–44)
AST: 31 U/L (ref 15–41)
Albumin: 3 g/dL — ABNORMAL LOW (ref 3.5–5.0)
Alkaline Phosphatase: 76 U/L (ref 38–126)
Anion gap: 12 (ref 5–15)
BUN: 74 mg/dL — ABNORMAL HIGH (ref 8–23)
CO2: 29 mmol/L (ref 22–32)
Calcium: 8.5 mg/dL — ABNORMAL LOW (ref 8.9–10.3)
Chloride: 93 mmol/L — ABNORMAL LOW (ref 98–111)
Creatinine, Ser: 2.68 mg/dL — ABNORMAL HIGH (ref 0.44–1.00)
GFR, Estimated: 19 mL/min — ABNORMAL LOW (ref >60)
Glucose, Bld: 150 mg/dL — ABNORMAL HIGH (ref 70–99)
Potassium: 4.4 mmol/L (ref 3.5–5.1)
Sodium: 134 mmol/L — ABNORMAL LOW (ref 135–145)
Total Bilirubin: 0.6 mg/dL (ref 0.3–1.2)
Total Protein: 8.5 g/dL — ABNORMAL HIGH (ref 6.5–8.1)

## 2022-02-10 LAB — RESP PANEL BY RT-PCR (FLU A&B, COVID) ARPGX2
Influenza A by PCR: NEGATIVE
Influenza B by PCR: NEGATIVE
SARS Coronavirus 2 by RT PCR: NEGATIVE

## 2022-02-10 LAB — PROTIME-INR
INR: 1.1 (ref 0.8–1.2)
Prothrombin Time: 14.3 seconds (ref 11.4–15.2)

## 2022-02-10 LAB — URINALYSIS, ROUTINE W REFLEX MICROSCOPIC
Bilirubin Urine: NEGATIVE
Glucose, UA: NEGATIVE mg/dL
Ketones, ur: NEGATIVE mg/dL
Nitrite: POSITIVE — AB
Protein, ur: 100 mg/dL — AB
Specific Gravity, Urine: 1.012 (ref 1.005–1.030)
pH: 5 (ref 5.0–8.0)

## 2022-02-10 LAB — LACTIC ACID, PLASMA
Lactic Acid, Venous: 1.8 mmol/L (ref 0.5–1.9)
Lactic Acid, Venous: 1.6 mmol/L (ref 0.5–1.9)

## 2022-02-10 LAB — VALPROIC ACID LEVEL: Valproic Acid Lvl: 34 ug/mL — ABNORMAL LOW (ref 50.0–100.0)

## 2022-02-10 LAB — APTT: aPTT: 31 seconds (ref 24–36)

## 2022-02-10 MED ORDER — VANCOMYCIN HCL IN DEXTROSE 1-5 GM/200ML-% IV SOLN: 1000.0000 mg | Freq: Once | INTRAVENOUS | Status: DC

## 2022-02-10 MED ORDER — SODIUM CHLORIDE 0.9 % IV SOLN
2.0000 g | INTRAVENOUS | Status: DC
  Administered 2022-02-11 – 2022-02-21 (×11): 2 g via INTRAVENOUS
  Filled 2022-02-10 (×11): qty 20

## 2022-02-10 MED ORDER — METOPROLOL TARTRATE 5 MG/5ML IV SOLN: 5.0000 mg | INTRAVENOUS | Status: DC

## 2022-02-10 MED ORDER — LIDOCAINE-EPINEPHRINE (PF) 1 %-1:200000 IJ SOLN
20.0000 mL | Freq: Once | INTRAMUSCULAR | Status: AC
  Administered 2022-02-10: 20 mL via INTRADERMAL
  Filled 2022-02-10: qty 30

## 2022-02-10 MED ORDER — SODIUM CHLORIDE 0.9 % IV SOLN: INTRAVENOUS | Status: DC

## 2022-02-10 MED ORDER — VANCOMYCIN HCL IN DEXTROSE 1-5 GM/200ML-% IV SOLN: 1000.0000 mg | INTRAVENOUS | Status: DC

## 2022-02-10 MED ORDER — ACETAMINOPHEN 650 MG RE SUPP
650.0000 mg | Freq: Once | RECTAL | Status: AC
  Administered 2022-02-10: 650 mg via RECTAL
  Filled 2022-02-10: qty 1

## 2022-02-10 MED ORDER — SODIUM CHLORIDE 0.9 % IV BOLUS
1000.0000 mL | Freq: Once | INTRAVENOUS | Status: AC
  Administered 2022-02-10: 1000 mL via INTRAVENOUS

## 2022-02-10 MED ORDER — METRONIDAZOLE 500 MG/100ML IV SOLN
500.0000 mg | Freq: Once | INTRAVENOUS | Status: AC
  Administered 2022-02-10: 500 mg via INTRAVENOUS
  Filled 2022-02-10: qty 100

## 2022-02-10 MED ORDER — METRONIDAZOLE 500 MG/100ML IV SOLN
500.0000 mg | Freq: Two times a day (BID) | INTRAVENOUS | Status: DC
  Administered 2022-02-11: 500 mg via INTRAVENOUS
  Filled 2022-02-10: qty 100

## 2022-02-10 MED ORDER — VANCOMYCIN HCL 2000 MG/400ML IV SOLN
2000.0000 mg | Freq: Once | INTRAVENOUS | Status: AC
  Administered 2022-02-10: 2000 mg via INTRAVENOUS
  Filled 2022-02-10: qty 400

## 2022-02-10 MED ORDER — POVIDONE-IODINE 10 % EX SOLN
CUTANEOUS | Status: AC
  Administered 2022-02-10: 1
  Filled 2022-02-10: qty 14.8

## 2022-02-10 MED ORDER — SODIUM CHLORIDE 0.9 % IV SOLN: 2.0000 g | INTRAVENOUS | Status: DC

## 2022-02-10 MED ORDER — SODIUM CHLORIDE 0.9 % IV SOLN
2.0000 g | Freq: Once | INTRAVENOUS | Status: AC
  Administered 2022-02-10: 2 g via INTRAVENOUS
  Filled 2022-02-10: qty 12.5

## 2022-02-10 NOTE — Assessment & Plan Note (Addendum)
-  Present on admission. Secondary to right shoulder abscess and bacteremia. -Blood cultures significant for E. Coli on BCID. Management as mentione. -acute kidney injury as organ dysfunction appreciated

## 2022-02-10 NOTE — Assessment & Plan Note (Deleted)
-  Baseline creatinine of about 1 - 1.1.  -Creatinine 2.68 on admission.   -In setting of severe sepsis.  -Continue holding diuretics and nephrotoxic agents -Continue IV fluids and maintain adequate hydration. -Follow renal function trend.  Creatinine currently down to 1.86 currently.  -Follow-up renal function trend.

## 2022-02-10 NOTE — Assessment & Plan Note (Deleted)
-  I&D performed bedside in the ED, significant for 200 mLs of purulent fluid (culture obtained and growing E. Coli).  -X-rays significant for soft tissue swelling and gas.  -Orthopedic surgery consulted; patient is status post I&D with removal of humeral head by Dr. Lajoyce Corners on 02/12/22. -Wound VAC in place; planning for second I&D later today 02/14/2022. -continue IV antibiotics and as needed analgesics..  -Blood cultures, urine cultures and wound culture suggesting E. coli infection pansensitive. -Continue IV ceftriaxone as recommended by ID; planning for 6 weeks total. -Echo has been ordered and pending currently.

## 2022-02-10 NOTE — Assessment & Plan Note (Addendum)
Glucose has been controlled, with fasting glucose this am at 128.  Continue with insulin sliding scalae for glucose cover and monitoring.

## 2022-02-10 NOTE — ED Triage Notes (Signed)
Facility called and reported patient had high blood pressure, generalized weakness and altered mental status. ...... LKW is unknown Patient is complaining of right shoulder pain. Right shoulder is red, tender, very warm to touch and appears abnormal.

## 2022-02-10 NOTE — Assessment & Plan Note (Addendum)
Multifactorial encephalopathy.  EEG negative for seizures, clinically improved.  Today her shoulder pain is improved, but she looks somnolent at the time of my examination.   Continue pain control and nutritional support  Depression and anxiety. Continue with escitalopram and as needed alprazolam.

## 2022-02-10 NOTE — ED Notes (Signed)
Provider at bedside during triage for MSE.

## 2022-02-10 NOTE — Assessment & Plan Note (Addendum)
Blood pressure has been stable, plan to continue close monitoring.

## 2022-02-10 NOTE — Assessment & Plan Note (Addendum)
Patient with positive lower extremity edema. Hold on IV fluids, and will resume oral diuretic therapy with torsemide 40 mg daily.

## 2022-02-11 ENCOUNTER — Inpatient Hospital Stay (HOSPITAL_COMMUNITY): Payer: Medicare HMO

## 2022-02-11 ENCOUNTER — Encounter (HOSPITAL_COMMUNITY): Payer: Self-pay | Admitting: Internal Medicine

## 2022-02-11 DIAGNOSIS — N179 Acute kidney failure, unspecified: Secondary | ICD-10-CM | POA: Diagnosis not present

## 2022-02-11 DIAGNOSIS — G9341 Metabolic encephalopathy: Secondary | ICD-10-CM | POA: Diagnosis not present

## 2022-02-11 DIAGNOSIS — N390 Urinary tract infection, site not specified: Secondary | ICD-10-CM

## 2022-02-11 DIAGNOSIS — B9620 Unspecified Escherichia coli [E. coli] as the cause of diseases classified elsewhere: Secondary | ICD-10-CM

## 2022-02-11 DIAGNOSIS — R7881 Bacteremia: Secondary | ICD-10-CM

## 2022-02-11 DIAGNOSIS — I10 Essential (primary) hypertension: Secondary | ICD-10-CM

## 2022-02-11 DIAGNOSIS — L02413 Cutaneous abscess of right upper limb: Secondary | ICD-10-CM | POA: Diagnosis not present

## 2022-02-11 DIAGNOSIS — A419 Sepsis, unspecified organism: Secondary | ICD-10-CM | POA: Diagnosis not present

## 2022-02-11 LAB — BLOOD CULTURE ID PANEL (REFLEXED) - BCID2

## 2022-02-11 LAB — BLOOD GAS, ARTERIAL
Acid-Base Excess: 3 mmol/L — ABNORMAL HIGH (ref 0.0–2.0)
Bicarbonate: 29 mmol/L — ABNORMAL HIGH (ref 20.0–28.0)
O2 Saturation: 98.4 %
Patient temperature: 37.1
pCO2 arterial: 49 mmHg — ABNORMAL HIGH (ref 32–48)
pH, Arterial: 7.38 (ref 7.35–7.45)
pO2, Arterial: 83 mmHg (ref 83–108)

## 2022-02-11 LAB — GLUCOSE, CAPILLARY
Glucose-Capillary: 133 mg/dL — ABNORMAL HIGH (ref 70–99)
Glucose-Capillary: 141 mg/dL — ABNORMAL HIGH (ref 70–99)
Glucose-Capillary: 115 mg/dL — ABNORMAL HIGH (ref 70–99)
Glucose-Capillary: 108 mg/dL — ABNORMAL HIGH (ref 70–99)

## 2022-02-11 LAB — CBC
HCT: 32.7 % — ABNORMAL LOW (ref 36.0–46.0)
Hemoglobin: 9.9 g/dL — ABNORMAL LOW (ref 12.0–15.0)
MCH: 29.7 pg (ref 26.0–34.0)
MCHC: 30.3 g/dL (ref 30.0–36.0)
MCV: 98.2 fL (ref 80.0–100.0)
Platelets: 128 10*3/uL — ABNORMAL LOW (ref 150–400)
RBC: 3.33 MIL/uL — ABNORMAL LOW (ref 3.87–5.11)
RDW: 14.6 % (ref 11.5–15.5)
WBC: 10.2 10*3/uL (ref 4.0–10.5)
nRBC: 0 % (ref 0.0–0.2)

## 2022-02-11 LAB — BASIC METABOLIC PANEL
Anion gap: 14 (ref 5–15)
BUN: 67 mg/dL — ABNORMAL HIGH (ref 8–23)
CO2: 23 mmol/L (ref 22–32)
Calcium: 8.2 mg/dL — ABNORMAL LOW (ref 8.9–10.3)
Chloride: 101 mmol/L (ref 98–111)
Creatinine, Ser: 2.53 mg/dL — ABNORMAL HIGH (ref 0.44–1.00)
GFR, Estimated: 20 mL/min — ABNORMAL LOW (ref >60)
Glucose, Bld: 138 mg/dL — ABNORMAL HIGH (ref 70–99)
Potassium: 4.3 mmol/L (ref 3.5–5.1)
Sodium: 138 mmol/L (ref 135–145)

## 2022-02-11 LAB — SURGICAL PCR SCREEN
MRSA, PCR: NEGATIVE
Staphylococcus aureus: NEGATIVE

## 2022-02-11 LAB — HIV ANTIBODY (ROUTINE TESTING W REFLEX): HIV Screen 4th Generation wRfx: NONREACTIVE

## 2022-02-11 MED ORDER — FOLIC ACID 1 MG PO TABS
1.0000 mg | ORAL_TABLET | Freq: Every day | ORAL | Status: DC
Start: 2022-02-11 — End: 2022-02-22
  Administered 2022-02-13 – 2022-02-21 (×8): 1 mg via ORAL
  Filled 2022-02-11 (×8): qty 1

## 2022-02-11 MED ORDER — HYDROMORPHONE HCL 1 MG/ML IJ SOLN
0.5000 mg | INTRAMUSCULAR | Status: DC | PRN
  Administered 2022-02-12 – 2022-02-15 (×12): 0.5 mg via INTRAVENOUS
  Filled 2022-02-11 (×12): qty 0.5

## 2022-02-11 MED ORDER — CHLORHEXIDINE GLUCONATE 4 % EX LIQD
60.0000 mL | Freq: Once | CUTANEOUS | Status: DC
  Filled 2022-02-11: qty 60

## 2022-02-11 MED ORDER — ACETAMINOPHEN 325 MG PO TABS: 650.0000 mg | ORAL_TABLET | Freq: Four times a day (QID) | ORAL | Status: DC | PRN

## 2022-02-11 MED ORDER — LABETALOL HCL 5 MG/ML IV SOLN: 10.0000 mg | INTRAVENOUS | Status: DC | PRN

## 2022-02-11 MED ORDER — OXYCODONE-ACETAMINOPHEN 5-325 MG PO TABS
1.0000 | ORAL_TABLET | Freq: Four times a day (QID) | ORAL | Status: DC | PRN
  Administered 2022-02-11 – 2022-02-14 (×5): 1 via ORAL
  Filled 2022-02-11 (×5): qty 1

## 2022-02-11 MED ORDER — ESCITALOPRAM OXALATE 10 MG PO TABS
20.0000 mg | ORAL_TABLET | Freq: Every day | ORAL | Status: DC
  Administered 2022-02-13 – 2022-02-21 (×8): 20 mg via ORAL
  Filled 2022-02-11 (×9): qty 2

## 2022-02-11 MED ORDER — HEPARIN SODIUM (PORCINE) 5000 UNIT/ML IJ SOLN
5000.0000 [IU] | Freq: Three times a day (TID) | INTRAMUSCULAR | Status: DC
  Administered 2022-02-11 – 2022-02-21 (×31): 5000 [IU] via SUBCUTANEOUS
  Filled 2022-02-11 (×31): qty 1

## 2022-02-11 MED ORDER — LACTATED RINGERS IV SOLN: INTRAVENOUS | Status: DC

## 2022-02-11 MED ORDER — ALPRAZOLAM 0.5 MG PO TABS
0.5000 mg | ORAL_TABLET | Freq: Three times a day (TID) | ORAL | Status: DC
  Administered 2022-02-11 – 2022-02-21 (×27): 0.5 mg via ORAL
  Filled 2022-02-11 (×27): qty 1

## 2022-02-11 MED ORDER — HYDROMORPHONE HCL 1 MG/ML IJ SOLN
0.5000 mg | INTRAMUSCULAR | Status: DC | PRN
  Administered 2022-02-11: 0.5 mg via INTRAVENOUS
  Filled 2022-02-11: qty 0.5

## 2022-02-11 MED ORDER — LACTATED RINGERS IV SOLN: INTRAVENOUS | Status: AC

## 2022-02-11 MED ORDER — ONDANSETRON HCL 4 MG PO TABS: 4.0000 mg | ORAL_TABLET | Freq: Four times a day (QID) | ORAL | Status: DC | PRN

## 2022-02-11 MED ORDER — ACETAMINOPHEN 650 MG RE SUPP: 650.0000 mg | Freq: Four times a day (QID) | RECTAL | Status: DC | PRN

## 2022-02-11 MED ORDER — CEFAZOLIN IN SODIUM CHLORIDE 3-0.9 GM/100ML-% IV SOLN
3.0000 g | INTRAVENOUS | Status: AC
  Administered 2022-02-12: 3 g via INTRAVENOUS
  Filled 2022-02-11 (×2): qty 100

## 2022-02-11 MED ORDER — POVIDONE-IODINE 10 % EX SWAB
2.0000 | Freq: Once | CUTANEOUS | Status: AC
  Administered 2022-02-12: 2 via TOPICAL

## 2022-02-11 MED ORDER — LORAZEPAM 2 MG/ML IJ SOLN
0.5000 mg | Freq: Once | INTRAMUSCULAR | Status: AC | PRN
  Administered 2022-02-11: 0.5 mg via INTRAVENOUS
  Filled 2022-02-11: qty 1

## 2022-02-11 MED ORDER — ONDANSETRON HCL 4 MG/2ML IJ SOLN
4.0000 mg | Freq: Four times a day (QID) | INTRAMUSCULAR | Status: DC | PRN
  Administered 2022-02-12 – 2022-02-15 (×3): 4 mg via INTRAVENOUS
  Filled 2022-02-11 (×3): qty 2

## 2022-02-11 NOTE — Assessment & Plan Note (Addendum)
-  Urine culture significant for E. Coli -continue treatment with rocephin

## 2022-02-11 NOTE — Assessment & Plan Note (Addendum)
-  continue treatment with rocephin -follow ID recommendations.

## 2022-02-11 NOTE — Progress Notes (Addendum)
ABG collected and sent to lab. Lab notified RT will monitor.

## 2022-02-11 NOTE — Progress Notes (Signed)
PROGRESS NOTE    SOMIA MAXIN  ZOX:[id number] DOB: May 28, 1956 DOA: 02/10/2022 PCP: Charlynne Pander, MD   Brief Narrative: Crystal Moses is a 66 y.o. female with a history of congestive heart failure, diabetes mellitus, hypertension, lower extremity edema. Patient presented secondary to altered mental status, weakness and right shoulder pain and found to have a right shoulder abscess and associated sepsis. Empiric antibiotic initiated. I&D performed. Blood cultures significant for E. Coli. Urine culture/wound culture significant for gram negative rods. Orthopedic surgery  and infectious disease consulted.   Assessment and Plan: * Severe sepsis (HCC) Present on admission. Secondary to right shoulder abscess and bacteremia. Blood cultures significant for E. Coli on BCID. Management as mentioned below.  Abscess of right shoulder I&D performed bedside in the ED, significant for 200 mLs of purulent fluid (culture obtained and growing GNRs). X-rays significant for soft tissue swelling and gas. Orthopedic surgery consulted. Patient started empirically on Vancomycin, Cefepime and Flagyl. Blood cultures significant for E. Coli on BCID. -Discontinue vancomycin/Flagyl -Transition Cefepime to Ceftriaxone -Orthopedic surgery recommendations pending -MRI right shoulder  Acute metabolic encephalopathy Altered mental status likely secondary to severe sepsis. Head CT without acute abnormality. Mild acidemia on ABG. Likely related to sepsis. Holding home PO medication until able to safely take PO. Improving. -EEG  AKI (acute kidney injury) (HCC) Baseline creatinine of about 1 - 1.1. Creatinine 2.68 on admission.  In setting of severe sepsis. Torsemide held. -Continue IV fluids -BMP in AM  E coli bacteremia See problem, Abscess of right shoulder  UTI (urinary tract infection) Urine culture significant for GNR -See problem, Abscess of right shoulder  HTN (hypertension) Patient is on metoprolol as an  outpatient. Held secondary to NPO status and Sepsis. -Continue Labetalol PRN  CHF (congestive heart failure) (HCC) Diastolic. Stable. Torsemide held on admission. -Daily weights and strict in/out -Watch fluid status while on IV fluids and NPO  DM (diabetes mellitus) (HCC) No medications listed; med history still pending, however. Hemoglobin A1C of 5.3% from 10/2020. -Carb modified diet once able to take PO    DVT prophylaxis: Heparin subq Code Status:   Code Status: Full Code Family Communication: Son on telephone Disposition Plan: Discharge back to SNF pending continued medical care and plan for outpatient antibiotic regimen   Consultants:  Orthopedic surgery Infectious disease  Procedures:  I&D (6/26)  Antimicrobials: Vancomycin Cefepime Flagyl Ceftriaxone    Subjective: Patient reports no pain this morning. No dyspnea. Very sleepy and she doesn't know why.  Objective: BP 137/88   Pulse 94   Temp 99.6 F (37.6 C) (Oral)   Resp 15   Ht 5\' 1"  (1.549 m)   Wt 121.6 kg   SpO2 97%   BMI 50.65 kg/m   Examination:  General exam: Appears calm and comfortable Respiratory system: Clear to auscultation. Respiratory effort normal. Cardiovascular system: S1 & S2 heard, RRR. No murmurs, rubs, gallops or clicks. Gastrointestinal system: Abdomen is nondistended, soft and nontender. No organomegaly or masses felt. Normal bowel sounds heard. Central nervous system: Lethargic but improved with continued verbal stimuli. Oriented to person and place. Musculoskeletal: No edema. No calf tenderness Skin: No cyanosis. No rashes   Data Reviewed: I have personally reviewed following labs and imaging studies  CBC Lab Results  Component Value Date   WBC 10.2 02/11/2022   RBC 3.33 (L) 02/11/2022   HGB 9.9 (L) 02/11/2022   HCT 32.7 (L) 02/11/2022   MCV 98.2 02/11/2022   MCH 29.7 02/11/2022  PLT 128 (L) 02/11/2022   MCHC 30.3 02/11/2022   RDW 14.6 02/11/2022   LYMPHSABS 0.7  02/10/2022   MONOABS 1.0 02/10/2022   EOSABS 0.1 02/10/2022   BASOSABS 0.1 02/10/2022     Last metabolic panel Lab Results  Component Value Date   NA 138 02/11/2022   K 4.3 02/11/2022   CL 101 02/11/2022   CO2 23 02/11/2022   BUN 67 (H) 02/11/2022   CREATININE 2.53 (H) 02/11/2022   GLUCOSE 138 (H) 02/11/2022   GFRNONAA 20 (L) 02/11/2022   CALCIUM 8.2 (L) 02/11/2022   PHOS 4.7 (H) 11/03/2020   PROT 8.5 (H) 02/10/2022   ALBUMIN 3.0 (L) 02/10/2022   BILITOT 0.6 02/10/2022   ALKPHOS 76 02/10/2022   AST 31 02/10/2022   ALT 18 02/10/2022   ANIONGAP 14 02/11/2022    GFR: Estimated Creatinine Clearance: 26.7 mL/min (A) (by C-G formula based on SCr of 2.53 mg/dL (H)).  Recent Results (from the past 240 hour(s))  Blood Culture (routine x 2)     Status: None (Preliminary result)   Collection Time: 02/10/22  4:40 PM   Specimen: BLOOD LEFT FOREARM  Result Value Ref Range Status   Specimen Description   Final    BLOOD LEFT FOREARM Performed at [facility], [address], [place], [place] [address]    Special Requests   Final    BOTTLES DRAWN AEROBIC AND ANAEROBIC Blood Culture adequate volume Performed at [facility], [address] [place], [place] [address]    Culture  Setup Time   Final    GRAM NEGATIVE RODS BOTTLES DRAWN AEROBIC AND ANAEROBIC Gram Stain Report Called to,Read Back By and Verified With: CHENEY,A@0521  BY MATTHEWS, B 6.27.2023 Performed at [facility], [address] [place], [place] [address]    Culture GRAM NEGATIVE RODS  Final   Report Status PENDING  Incomplete  Blood Culture (routine x 2)     Status: None (Preliminary result)   Collection Time: 02/10/22  4:45 PM   Specimen: BLOOD RIGHT HAND  Result Value Ref Range Status   Specimen Description   Final    BLOOD RIGHT HAND Performed at [facility], [address], [place], [place] [address]    Special Requests   Final    BOTTLES DRAWN AEROBIC AND ANAEROBIC Blood Culture adequate volume Performed  at [facility], [address] [place], [place] [address]    Culture  Setup Time   Final    GRAM NEGATIVE RODS Gram Stain Report Called to,Read Back By and Verified With: CHENEY,A@0521  BY MATTHEWS, B 6.27.2023 IN BOTH AEROBIC AND ANAEROBIC BOTTLES Organism ID to follow CRITICAL RESULT CALLED TO, READ BACK BY AND VERIFIED WITH:  C/ PHARMD ELIZABETH MARTIN 02/11/22 1212 A. LAFRANCE Performed at [facility], [address], [place], [place] [address]    Culture GRAM NEGATIVE RODS  Final   Report Status PENDING  Incomplete  Blood Culture ID Panel (Reflexed)     Status: Abnormal   Collection Time: 02/10/22  4:45 PM  Result Value Ref Range Status   Enterococcus faecalis NOT DETECTED NOT DETECTED Final   Enterococcus Faecium NOT DETECTED NOT DETECTED Final   Listeria monocytogenes NOT DETECTED NOT DETECTED Final   Staphylococcus species NOT DETECTED NOT DETECTED Final   Staphylococcus aureus (BCID) NOT DETECTED NOT DETECTED Final   Staphylococcus epidermidis NOT DETECTED NOT DETECTED Final   Staphylococcus lugdunensis NOT DETECTED NOT DETECTED Final   Streptococcus species NOT DETECTED NOT DETECTED Final  Streptococcus agalactiae NOT DETECTED NOT DETECTED Final   Streptococcus pneumoniae NOT DETECTED NOT DETECTED Final   Streptococcus pyogenes NOT DETECTED NOT DETECTED Final   A.calcoaceticus-baumannii NOT DETECTED NOT DETECTED Final   Bacteroides fragilis NOT DETECTED NOT DETECTED Final   Enterobacterales DETECTED (A) NOT DETECTED Final    Comment: Enterobacterales represent a large order of gram negative bacteria, not a single organism. CRITICAL RESULT CALLED TO, READ BACK BY AND VERIFIED WITH:  C/ PHARMD ELIZABETH MARTIN 02/11/22 1212 A. LAFRANCE    Enterobacter cloacae complex NOT DETECTED NOT DETECTED Final   Escherichia coli DETECTED (A) NOT DETECTED Final    Comment: CRITICAL RESULT CALLED TO, READ BACK BY AND VERIFIED WITH:  C/ PHARMD ELIZABETH MARTIN 02/11/22 1212 A.  LAFRANCE    Klebsiella aerogenes NOT DETECTED NOT DETECTED Final   Klebsiella oxytoca NOT DETECTED NOT DETECTED Final   Klebsiella pneumoniae NOT DETECTED NOT DETECTED Final   Proteus species NOT DETECTED NOT DETECTED Final   Salmonella species NOT DETECTED NOT DETECTED Final   Serratia marcescens NOT DETECTED NOT DETECTED Final   Haemophilus influenzae NOT DETECTED NOT DETECTED Final   Neisseria meningitidis NOT DETECTED NOT DETECTED Final   Pseudomonas aeruginosa NOT DETECTED NOT DETECTED Final   Stenotrophomonas maltophilia NOT DETECTED NOT DETECTED Final   Candida albicans NOT DETECTED NOT DETECTED Final   Candida auris NOT DETECTED NOT DETECTED Final   Candida glabrata NOT DETECTED NOT DETECTED Final   Candida krusei NOT DETECTED NOT DETECTED Final   Candida parapsilosis NOT DETECTED NOT DETECTED Final   Candida tropicalis NOT DETECTED NOT DETECTED Final   Cryptococcus neoformans/gattii NOT DETECTED NOT DETECTED Final   CTX-M ESBL NOT DETECTED NOT DETECTED Final   Carbapenem resistance IMP NOT DETECTED NOT DETECTED Final   Carbapenem resistance KPC NOT DETECTED NOT DETECTED Final   Carbapenem resistance NDM NOT DETECTED NOT DETECTED Final   Carbapenem resist OXA 48 LIKE NOT DETECTED NOT DETECTED Final   Carbapenem resistance VIM NOT DETECTED NOT DETECTED Final    Comment: Performed at [facility], [address], [place], [place] [address]  Resp Panel by RT-PCR (Flu A&B, Covid) Anterior Nasal Swab     Status: None   Collection Time: 02/10/22  4:48 PM   Specimen: Anterior Nasal Swab  Result Value Ref Range Status   SARS Coronavirus 2 by RT PCR NEGATIVE NEGATIVE Final    Comment: (NOTE) SARS-CoV-2 target nucleic acids are NOT DETECTED.  The SARS-CoV-2 RNA is generally detectable in upper respiratory specimens during the acute phase of infection. The lowest concentration of SARS-CoV-2 viral copies this assay can detect is 138 copies/mL. A negative result does not  preclude SARS-Cov-2 infection and should not be used as the sole basis for treatment or other patient management decisions. A negative result may occur with  improper specimen collection/handling, submission of specimen other than nasopharyngeal swab, presence of viral mutation(s) within the areas targeted by this assay, and inadequate number of viral copies(<138 copies/mL). A negative result must be combined with clinical observations, patient history, and epidemiological information. The expected result is Negative.  Fact Sheet for Patients:  BloggerCourse.com  Fact Sheet for Healthcare Providers:  SeriousBroker.it  This test is no t yet approved or cleared by the [place] FDA and  has been authorized for detection and/or diagnosis of SARS-CoV-2 by FDA under an Emergency Use Authorization (EUA). This EUA will remain  in effect (meaning this test can be used) for the duration of the COVID-19  declaration under Section 564(b)(1) of the Act, 21 U.S.C.section 360bbb-3(b)(1), unless the authorization is terminated  or revoked sooner.       Influenza A by PCR NEGATIVE NEGATIVE Final   Influenza B by PCR NEGATIVE NEGATIVE Final    Comment: (NOTE) The Xpert Xpress SARS-CoV-2/FLU/RSV plus assay is intended as an aid in the diagnosis of influenza from Nasopharyngeal swab specimens and should not be used as a sole basis for treatment. Nasal washings and aspirates are unacceptable for Xpert Xpress SARS-CoV-2/FLU/RSV testing.  Fact Sheet for Patients: BloggerCourse.com  Fact Sheet for Healthcare Providers: SeriousBroker.it  This test is not yet approved or cleared by the [place] FDA and has been authorized for detection and/or diagnosis of SARS-CoV-2 by FDA under an Emergency Use Authorization (EUA). This EUA will remain in effect (meaning this test can be used) for the  duration of the COVID-19 declaration under Section 564(b)(1) of the Act, 21 U.S.C. section 360bbb-3(b)(1), unless the authorization is terminated or revoked.  Performed at [facility], [address] [place], [place] [address]   Urine Culture     Status: Abnormal (Preliminary result)   Collection Time: 02/10/22  5:05 PM   Specimen: Urine, Clean Catch  Result Value Ref Range Status   Specimen Description   Final    URINE, CLEAN CATCH Performed at [facility], [address] [place], [place] [address]    Special Requests   Final    NONE Performed at [facility], [address] [place], [place] [address]    Culture (A)  Final    >=100,000 COLONIES/mL GRAM NEGATIVE RODS IDENTIFICATION AND SUSCEPTIBILITIES TO FOLLOW CULTURE REINCUBATED FOR BETTER GROWTH Performed at [facility], [address] [place], [place] [address]    Report Status PENDING  Incomplete  Aerobic Culture w Gram Stain (superficial specimen)     Status: None (Preliminary result)   Collection Time: 02/10/22  7:20 PM   Specimen: Abscess  Result Value Ref Range Status   Specimen Description   Final    ABSCESS Performed at [facility], [address] [place], [place] [address]    Special Requests   Final    Normal Performed at [facility], [address], [place], [place] [address]    Gram Stain   Final    NO WBC SEEN RARE GRAM NEGATIVE RODS RARE GRAM POSITIVE RODS Performed at [facility], [address], [place], [place] [address]    Culture PENDING  Incomplete   Report Status PENDING  Incomplete      Radiology Studies: EEG adult  Result Date: 18-Feb-2022 Charlsie Quest, MD     2022-02-18 12:35 PM Patient Name: Crystal Moses MRN: [id number] Epilepsy Attending: Charlsie Quest Referring Physician/Provider: Narda Bonds, MD Date: 2022-02-18 Duration: 22.44 mins Patient history: 66yo F with the ams. EEG to evaluate for seizure. Level of alertness: Awake, asleep AEDs during EEG study:  None Technical aspects: This EEG study was done with scalp electrodes positioned according to the 10-20 International system of electrode placement. Electrical activity was acquired at a sampling rate of 500Hz  and reviewed with a high frequency filter of 70Hz  and a low frequency filter of 1Hz . EEG data were recorded continuously and digitally stored. Description: The posterior dominant rhythm consists of 8-9 Hz activity of moderate voltage (25-35 uV) seen predominantly in posterior head regions, symmetric and reactive to eye opening and eye closing. Sleep was characterized by vertex waves, sleep spindles (12 to 14  Hz), maximal frontocentral region. EEG showed intermittent generalized 3 to 6 Hz theta-delta slowing. Hyperventilation and photic stimulation were not performed.   ABNORMALITY - Intermittent slow, generalized IMPRESSION: This study is suggestive of mild diffuse encephalopathy, nonspecific etiology. No seizures or epileptiform discharges were seen throughout the recording. Priyanka Annabelle Harman   CT HEAD WO CONTRAST ( )  Result Date: 02/10/2022 CLINICAL DATA:  Altered mental status. EXAM: CT HEAD WITHOUT CONTRAST TECHNIQUE: Contiguous axial images were obtained from the base of the skull through the vertex without intravenous contrast. RADIATION DOSE REDUCTION: This exam was performed according to the departmental dose-optimization program which includes automated exposure control, adjustment of the mA and/or kV according to patient size and/or use of iterative reconstruction technique. COMPARISON:  November 02, 2020 FINDINGS: Brain: There is mild cerebral atrophy with widening of the extra-axial spaces and ventricular dilatation. There are areas of decreased attenuation within the white matter tracts of the supratentorial brain, consistent with microvascular disease changes. Vascular: No hyperdense vessel or unexpected calcification. Skull: Normal. Negative for fracture or focal lesion. Sinuses/Orbits: There  is mild sphenoid sinus mucosal thickening. Other: None. IMPRESSION: 1. No acute intracranial abnormality. 2. Generalized cerebral atrophy with chronic white matter small vessel ischemic changes. Electronically Signed   By: Aram Candela M.D.   On: 02/10/2022 21:40   DG Chest Port 1 View  Result Date: 02/10/2022 CLINICAL DATA:  Generalized weakness, sepsis EXAM: PORTABLE CHEST 1 VIEW COMPARISON:  11/02/2020 FINDINGS: Mild bilateral interstitial thickening likely chronic and accentuated by low lung volumes. No focal consolidation. No pleural effusion or pneumothorax. Heart and mediastinal contours are unremarkable. No acute osseous abnormality. IMPRESSION: No active disease. Electronically Signed   By: Elige Ko M.D.   On: 02/10/2022 17:08   DG Shoulder Right  Result Date: 02/10/2022 CLINICAL DATA:  Questionable sepsis - evaluate for abnormality. Shoulder pain. EXAM: RIGHT SHOULDER - 2+ VIEW COMPARISON:  None Available. FINDINGS: Soft tissue swelling noted laterally with gas in the soft tissues near the skin surface. Advanced degenerative changes in the right shoulder with joint space loss and spurring. Near complete loss of subacromial space compatible with rotator cuff disease. No acute bony abnormality. Specifically, no fracture, subluxation, or dislocation. No bone destruction to suggest osteomyelitis. IMPRESSION: Soft tissue swelling laterally with gas in the superficial soft tissues. This could reflect soft tissue abscess. Advanced degenerative changes in the right shoulder. No acute bony abnormality. Electronically Signed   By: Charlett Nose M.D.   On: 02/10/2022 17:05      LOS: 1 day    Jacquelin Hawking, MD [facility] 02/11/2022, 5:38 PM   If 7PM-7AM, please contact night-coverage [email or web address]

## 2022-02-12 ENCOUNTER — Encounter (HOSPITAL_COMMUNITY): Payer: Self-pay | Admitting: Internal Medicine

## 2022-02-12 ENCOUNTER — Inpatient Hospital Stay (HOSPITAL_COMMUNITY): Payer: Medicare HMO | Admitting: Certified Registered Nurse Anesthetist

## 2022-02-12 ENCOUNTER — Other Ambulatory Visit: Payer: Self-pay

## 2022-02-12 ENCOUNTER — Encounter (HOSPITAL_COMMUNITY)
Admission: EM | Disposition: A | Discharge status: Home Health | Payer: Self-pay | Source: Other Acute Inpatient Hospital | Attending: Internal Medicine

## 2022-02-12 DIAGNOSIS — N3000 Acute cystitis without hematuria: Secondary | ICD-10-CM

## 2022-02-12 DIAGNOSIS — M869 Osteomyelitis, unspecified: Secondary | ICD-10-CM | POA: Diagnosis not present

## 2022-02-12 DIAGNOSIS — L02413 Cutaneous abscess of right upper limb: Secondary | ICD-10-CM | POA: Diagnosis not present

## 2022-02-12 DIAGNOSIS — I110 Hypertensive heart disease with heart failure: Secondary | ICD-10-CM | POA: Diagnosis not present

## 2022-02-12 DIAGNOSIS — I2510 Atherosclerotic heart disease of native coronary artery without angina pectoris: Secondary | ICD-10-CM

## 2022-02-12 DIAGNOSIS — A419 Sepsis, unspecified organism: Secondary | ICD-10-CM | POA: Diagnosis not present

## 2022-02-12 DIAGNOSIS — I5032 Chronic diastolic (congestive) heart failure: Secondary | ICD-10-CM | POA: Diagnosis not present

## 2022-02-12 DIAGNOSIS — N179 Acute kidney failure, unspecified: Secondary | ICD-10-CM | POA: Diagnosis not present

## 2022-02-12 DIAGNOSIS — R6520 Severe sepsis without septic shock: Secondary | ICD-10-CM | POA: Diagnosis not present

## 2022-02-12 DIAGNOSIS — I509 Heart failure, unspecified: Secondary | ICD-10-CM

## 2022-02-12 HISTORY — PX: APPLICATION OF WOUND VAC: SHX5189

## 2022-02-12 HISTORY — PX: I & D EXTREMITY: SHX5045

## 2022-02-12 LAB — GLUCOSE, CAPILLARY
Glucose-Capillary: 129 mg/dL — ABNORMAL HIGH (ref 70–99)
Glucose-Capillary: 132 mg/dL — ABNORMAL HIGH (ref 70–99)
Glucose-Capillary: 134 mg/dL — ABNORMAL HIGH (ref 70–99)
Glucose-Capillary: 186 mg/dL — ABNORMAL HIGH (ref 70–99)

## 2022-02-12 LAB — BASIC METABOLIC PANEL
Anion gap: 14 (ref 5–15)
BUN: 64 mg/dL — ABNORMAL HIGH (ref 8–23)
CO2: 24 mmol/L (ref 22–32)
Calcium: 8.5 mg/dL — ABNORMAL LOW (ref 8.9–10.3)
Chloride: 104 mmol/L (ref 98–111)
Creatinine, Ser: 2.15 mg/dL — ABNORMAL HIGH (ref 0.44–1.00)
GFR, Estimated: 25 mL/min — ABNORMAL LOW (ref >60)
Glucose, Bld: 154 mg/dL — ABNORMAL HIGH (ref 70–99)
Potassium: 4 mmol/L (ref 3.5–5.1)
Sodium: 142 mmol/L (ref 135–145)

## 2022-02-12 LAB — CBC
HCT: 31.4 % — ABNORMAL LOW (ref 36.0–46.0)
Hemoglobin: 9.5 g/dL — ABNORMAL LOW (ref 12.0–15.0)
MCH: 29.3 pg (ref 26.0–34.0)
MCHC: 30.3 g/dL (ref 30.0–36.0)
MCV: 96.9 fL (ref 80.0–100.0)
Platelets: 136 10*3/uL — ABNORMAL LOW (ref 150–400)
RBC: 3.24 MIL/uL — ABNORMAL LOW (ref 3.87–5.11)
RDW: 14.6 % (ref 11.5–15.5)
WBC: 8.1 10*3/uL (ref 4.0–10.5)
nRBC: 0 % (ref 0.0–0.2)

## 2022-02-12 SURGERY — IRRIGATION AND DEBRIDEMENT EXTREMITY
Anesthesia: General | Laterality: Right

## 2022-02-12 MED ORDER — FENTANYL CITRATE (PF) 250 MCG/5ML IJ SOLN
INTRAMUSCULAR | Status: AC
  Filled 2022-02-12: qty 5

## 2022-02-12 MED ORDER — CHLORHEXIDINE GLUCONATE 0.12 % MT SOLN
OROMUCOSAL | Status: AC
  Filled 2022-02-12: qty 15

## 2022-02-12 MED ORDER — CHLORHEXIDINE GLUCONATE 0.12 % MT SOLN: 15.0000 mL | Freq: Once | OROMUCOSAL | Status: DC

## 2022-02-12 MED ORDER — LIDOCAINE 2% (20 MG/ML) 5 ML SYRINGE
INTRAMUSCULAR | Status: DC | PRN
  Administered 2022-02-12: 60 mg via INTRAVENOUS

## 2022-02-12 MED ORDER — LIDOCAINE 2% (20 MG/ML) 5 ML SYRINGE
INTRAMUSCULAR | Status: AC
  Filled 2022-02-12: qty 15

## 2022-02-12 MED ORDER — FENTANYL CITRATE (PF) 250 MCG/5ML IJ SOLN
INTRAMUSCULAR | Status: DC | PRN
Start: 2022-02-12 — End: 2022-02-12
  Administered 2022-02-12 (×4): 50 ug via INTRAVENOUS

## 2022-02-12 MED ORDER — OXYCODONE HCL 5 MG/5ML PO SOLN
5.0000 mg | Freq: Once | ORAL | Status: AC | PRN
  Administered 2022-02-12: 5 mg via ORAL

## 2022-02-12 MED ORDER — MIDAZOLAM HCL 2 MG/2ML IJ SOLN
INTRAMUSCULAR | Status: AC
  Filled 2022-02-12: qty 2

## 2022-02-12 MED ORDER — ONDANSETRON HCL 4 MG/2ML IJ SOLN: 4.0000 mg | Freq: Four times a day (QID) | INTRAMUSCULAR | Status: DC | PRN

## 2022-02-12 MED ORDER — ONDANSETRON HCL 4 MG/2ML IJ SOLN
INTRAMUSCULAR | Status: AC
  Filled 2022-02-12: qty 6

## 2022-02-12 MED ORDER — ONDANSETRON HCL 4 MG/2ML IJ SOLN
INTRAMUSCULAR | Status: DC | PRN
  Administered 2022-02-12: 4 mg via INTRAVENOUS

## 2022-02-12 MED ORDER — OXYCODONE HCL 5 MG PO TABS: 5.0000 mg | ORAL_TABLET | Freq: Once | ORAL | Status: AC | PRN

## 2022-02-12 MED ORDER — 0.9 % SODIUM CHLORIDE (POUR BTL) OPTIME
TOPICAL | Status: DC | PRN
  Administered 2022-02-12: 1000 mL

## 2022-02-12 MED ORDER — VANCOMYCIN HCL 1000 MG IV SOLR
INTRAVENOUS | Status: DC | PRN
  Administered 2022-02-12: 1000 mg via TOPICAL

## 2022-02-12 MED ORDER — DEXAMETHASONE SODIUM PHOSPHATE 10 MG/ML IJ SOLN
INTRAMUSCULAR | Status: DC | PRN
  Administered 2022-02-12: 8 mg via INTRAVENOUS

## 2022-02-12 MED ORDER — PHENYLEPHRINE HCL-NACL 20-0.9 MG/250ML-% IV SOLN
INTRAVENOUS | Status: DC | PRN
  Administered 2022-02-12: 50 ug/min via INTRAVENOUS

## 2022-02-12 MED ORDER — DEXAMETHASONE SODIUM PHOSPHATE 10 MG/ML IJ SOLN
INTRAMUSCULAR | Status: AC
  Filled 2022-02-12: qty 3

## 2022-02-12 MED ORDER — FENTANYL CITRATE (PF) 100 MCG/2ML IJ SOLN
INTRAMUSCULAR | Status: AC
  Filled 2022-02-12: qty 2

## 2022-02-12 MED ORDER — ORAL CARE MOUTH RINSE: 15.0000 mL | Freq: Once | OROMUCOSAL | Status: DC

## 2022-02-12 MED ORDER — VANCOMYCIN HCL 1000 MG IV SOLR
INTRAVENOUS | Status: AC
  Filled 2022-02-12: qty 20

## 2022-02-12 MED ORDER — SODIUM CHLORIDE 0.9 % IV SOLN
INTRAVENOUS | Status: DC
Start: 2022-02-12 — End: 2022-02-12

## 2022-02-12 MED ORDER — PROPOFOL 10 MG/ML IV BOLUS
INTRAVENOUS | Status: DC | PRN
  Administered 2022-02-12: 150 mg via INTRAVENOUS

## 2022-02-12 MED ORDER — OXYCODONE HCL 5 MG/5ML PO SOLN
ORAL | Status: AC
  Filled 2022-02-12: qty 5

## 2022-02-12 MED ORDER — FENTANYL CITRATE (PF) 100 MCG/2ML IJ SOLN
25.0000 ug | INTRAMUSCULAR | Status: DC | PRN
  Administered 2022-02-12 (×2): 50 ug via INTRAVENOUS

## 2022-02-12 MED ORDER — SUGAMMADEX SODIUM 200 MG/2ML IV SOLN
INTRAVENOUS | Status: DC | PRN
  Administered 2022-02-12: 400 mg via INTRAVENOUS

## 2022-02-12 MED ORDER — PHENYLEPHRINE 80 MCG/ML (10ML) SYRINGE FOR IV PUSH (FOR BLOOD PRESSURE SUPPORT)
PREFILLED_SYRINGE | INTRAVENOUS | Status: DC | PRN
  Administered 2022-02-12: 160 ug via INTRAVENOUS

## 2022-02-12 MED ORDER — ROCURONIUM BROMIDE 10 MG/ML (PF) SYRINGE
PREFILLED_SYRINGE | INTRAVENOUS | Status: DC | PRN
  Administered 2022-02-12: 60 mg via INTRAVENOUS

## 2022-02-12 MED ORDER — ENSURE PRE-SURGERY PO LIQD
296.0000 mL | Freq: Once | ORAL | Status: AC
Start: 2022-02-12 — End: 2022-02-12
  Administered 2022-02-12: 140 mL via ORAL
  Filled 2022-02-12: qty 296

## 2022-02-12 SURGICAL SUPPLY — 40 items
BAG COUNTER SPONGE SURGICOUNT (BAG) IMPLANT
BLADE SAW SGTL MED 73X18.5 STR (BLADE) ×1 IMPLANT
BLADE SURG 21 STRL SS (BLADE) ×3 IMPLANT
BNDG COHESIVE 6X5 TAN STRL LF (GAUZE/BANDAGES/DRESSINGS) IMPLANT
BNDG GAUZE ELAST 4 BULKY (GAUZE/BANDAGES/DRESSINGS) ×5 IMPLANT
CANISTER WOUND CARE 500ML ATS (WOUND CARE) ×2 IMPLANT
CNTNR URN SCR LID CUP LEK RST (MISCELLANEOUS) IMPLANT
CONT SPEC 4OZ STRL OR WHT (MISCELLANEOUS) ×1
COVER SURGICAL LIGHT HANDLE (MISCELLANEOUS) ×7 IMPLANT
DRAPE INCISE IOBAN 66X45 STRL (DRAPES) ×1 IMPLANT
DRAPE U-SHAPE 47X51 STRL (DRAPES) ×3 IMPLANT
DRESSING VERAFLO CLEANS CC MED (GAUZE/BANDAGES/DRESSINGS) IMPLANT
DRSG ADAPTIC 3X8 NADH LF (GAUZE/BANDAGES/DRESSINGS) ×2 IMPLANT
DRSG VERAFLO CLEANSE CC MED (GAUZE/BANDAGES/DRESSINGS) ×3
DURAPREP 26ML APPLICATOR (WOUND CARE) ×3 IMPLANT
ELECT REM PT RETURN 9FT ADLT (ELECTROSURGICAL) ×3
ELECTRODE REM PT RTRN 9FT ADLT (ELECTROSURGICAL) IMPLANT
GAUZE SPONGE 4X4 12PLY STRL (GAUZE/BANDAGES/DRESSINGS) ×2 IMPLANT
GLOVE BIOGEL PI IND STRL 9 (GLOVE) ×2 IMPLANT
GLOVE BIOGEL PI INDICATOR 9 (GLOVE) ×1
GLOVE SURG ORTHO 9.0 STRL STRW (GLOVE) ×3 IMPLANT
GOWN STRL REUS W/ TWL XL LVL3 (GOWN DISPOSABLE) ×4 IMPLANT
GOWN STRL REUS W/TWL XL LVL3 (GOWN DISPOSABLE) ×2
HANDPIECE INTERPULSE COAX TIP (DISPOSABLE)
KIT BASIN OR (CUSTOM PROCEDURE TRAY) ×3 IMPLANT
KIT TURNOVER KIT B (KITS) ×3 IMPLANT
MANIFOLD NEPTUNE II (INSTRUMENTS) ×3 IMPLANT
NS IRRIG 1000ML POUR BTL (IV SOLUTION) ×3 IMPLANT
PACK ORTHO EXTREMITY (CUSTOM PROCEDURE TRAY) ×3 IMPLANT
PAD ARMBOARD 7.5X6 YLW CONV (MISCELLANEOUS) ×6 IMPLANT
PAD NEG PRESSURE SENSATRAC (MISCELLANEOUS) ×1 IMPLANT
SET HNDPC FAN SPRY TIP SCT (DISPOSABLE) IMPLANT
SPONGE T-LAP 18X18 ~~LOC~~+RFID (SPONGE) ×2 IMPLANT
STOCKINETTE IMPERVIOUS 9X36 MD (GAUZE/BANDAGES/DRESSINGS) IMPLANT
SUT ETHILON 2 0 PSLX (SUTURE) ×3 IMPLANT
SWAB COLLECTION DEVICE MRSA (MISCELLANEOUS) ×2 IMPLANT
SWAB CULTURE ESWAB REG 1ML (MISCELLANEOUS) IMPLANT
TOWEL GREEN STERILE (TOWEL DISPOSABLE) ×3 IMPLANT
TUBE CONNECTING 12X1/4 (SUCTIONS) ×3 IMPLANT
YANKAUER SUCT BULB TIP NO VENT (SUCTIONS) ×3 IMPLANT

## 2022-02-12 NOTE — Progress Notes (Signed)
PROGRESS NOTE    Crystal Moses  ZOX:[id number] DOB: [birth year] DOA: 02/10/2022 PCP: Charlynne Pander, MD   Brief Narrative: Crystal Moses is a 66 y.o. female with a history of congestive heart failure, diabetes mellitus, hypertension, lower extremity edema. Patient presented secondary to altered mental status, weakness and right shoulder pain and found to have a right shoulder abscess and associated sepsis. Empiric antibiotic initiated. I&D performed. Blood cultures significant for E. Coli. Urine culture/wound culture significant for gram negative rods. Orthopedic surgery and infectious disease consulted.   Assessment and Plan: * Severe sepsis (HCC) -Present on admission. Secondary to right shoulder abscess and bacteremia. -Blood cultures significant for E. Coli on BCID. Management as mentione. -acute kidney injury as organ dysfunction appreciated   Abscess of right shoulder -I&D performed bedside in the ED, significant for 200 mLs of purulent fluid (culture obtained and growing E. coli.  -X-rays significant for soft tissue swelling and gas.  -Orthopedic surgery consulted; Dr. Lajoyce Corners planning for I&D later today. -continue IV antibiotics and analgesics..  -Patient started empirically on Vancomycin, Cefepime and Flagyl.  -Blood cultures significant for E. Coli on BCID.  Antibiotic has been narrowed to ceftriaxone. -Continue to follow orthopedic service and ID service recommendations.  Acute metabolic encephalopathy -Altered mental status likely secondary to severe sepsis.  -Head CT without acute abnormality.  -EEG demonstrating encephalopathy without acute or specific abnormalities or signs of epileptic waves.   -mentation improved/encephalopathy resolving.  AKI (acute kidney injury) (HCC) -Baseline creatinine of about 1 - 1.1.  -Creatinine 2.68 on admission.   -In setting of severe sepsis.  -Continue holding diuretics and nephrotoxic agents -Continue IV fluids -Follow renal function  trend.  Creatinine currently down to 2.2.    E coli bacteremia -continue treatment with rocephin -follow ID recommendations.   UTI (urinary tract infection) -Urine culture significant for E. Coli -continue treatment with rocephin    HTN (hypertension) -Continue Labetalol PRN -BP stable  CHF (congestive heart failure) (HCC) -Diastolic. Stable and compensated. -continue holding Torsemide  -continue Daily weights and strict in/out -continue to Watch fluid status while on IV fluids and NPO  DM (diabetes mellitus) (HCC) -continue modified carb diet -follow CBG's fluctuation -resume diet control as an outpatient. -Hemoglobin A1C of 5.3% from 10/2020.     DVT prophylaxis: Heparin subq Code Status:   Code Status: Full Code Family Communication: Son on telephone Disposition Plan: Discharge back to SNF pending continued medical care and plan for outpatient antibiotic regimen.   Consultants:  Orthopedic surgery Infectious disease  Procedures:  I&D (6/26) 2D echo  Antimicrobials: Vancomycin Cefepime Flagyl Ceftriaxone    Subjective: No fever, no chest pain, no nausea, no vomiting.  No overnight events.  Objective: BP 110/66 (BP Location: Left Arm)   Pulse 92   Temp 98.4 F (36.9 C) (Oral)   Resp 10   Ht 5\' 1"  (1.549 m)   Wt 116.5 kg   SpO2 95%   BMI 48.53 kg/m   Examination: General exam: Alert, awake, oriented x 3; reports some pain in her shoulder; no nausea, no vomiting, afebrile. Respiratory system: Good air movement bilaterally; no using accessory muscles. Cardiovascular system: No rubs, no gallops, no JVD. Gastrointestinal system: Abdomen is nondistended, soft and nontender. No organomegaly or masses felt. Normal bowel sounds heard. Central nervous system: Alert and oriented. No focal neurological deficits. Extremities: No cyanosis or clubbing; decreased range of motion right shoulder pain. Skin: No petechiae.  Purulent drainage) from right shoulder  appreciated. Psychiatry:  Judgement and insight appear normal. Mood & affect appropriate.    Data Reviewed: I have personally reviewed following labs and imaging studies  CBC Lab Results  Component Value Date   WBC 8.1 02/12/2022   RBC 3.24 (L) 02/12/2022   HGB 9.5 (L) 02/12/2022   HCT 31.4 (L) 02/12/2022   MCV 96.9 02/12/2022   MCH 29.3 02/12/2022   PLT 136 (L) 02/12/2022   MCHC 30.3 02/12/2022   RDW 14.6 02/12/2022   LYMPHSABS 0.7 02/10/2022   MONOABS 1.0 02/10/2022   EOSABS 0.1 02/10/2022   BASOSABS 0.1 [id number]     Last metabolic panel Lab Results  Component Value Date   NA 142 02/12/2022   K 4.0 02/12/2022   CL 104 02/12/2022   CO2 24 02/12/2022   BUN 64 (H) 02/12/2022   CREATININE 2.15 (H) 02/12/2022   GLUCOSE 154 (H) 02/12/2022   GFRNONAA 25 (L) 02/12/2022   CALCIUM 8.5 (L) 02/12/2022   PHOS 4.7 (H) 11/03/2020   PROT 8.5 (H) 02/10/2022   ALBUMIN 3.0 (L) 02/10/2022   BILITOT 0.6 02/10/2022   ALKPHOS 76 02/10/2022   AST 31 02/10/2022   ALT 18 02/10/2022   ANIONGAP 14 02/12/2022    GFR: Estimated Creatinine Clearance: 30.6 mL/min (A) (by C-G formula based on SCr of 2.15 mg/dL (H)).  Recent Results (from the past 240 hour(s))  Blood Culture (routine x 2)     Status: None (Preliminary result)   Collection Time: 02/10/22  4:40 PM   Specimen: BLOOD LEFT FOREARM  Result Value Ref Range Status   Specimen Description   Final    BLOOD LEFT FOREARM Performed at [facility], [address], [place], [place] [address]    Special Requests   Final    BOTTLES DRAWN AEROBIC AND ANAEROBIC Blood Culture adequate volume Performed at [facility], [address] [place], [place] [address]    Culture  Setup Time   Final    GRAM NEGATIVE RODS BOTTLES DRAWN AEROBIC AND ANAEROBIC Gram Stain Report Called to,Read Back By and Verified With: CHENEY,A@0521  BY MATTHEWS, B 6.27.2023 Performed at [facility], [address] [place], [place] [address]    Culture   Final     Lonell Grandchild NEGATIVE RODS IDENTIFICATION TO FOLLOW Performed at [facility], [place] [address] [place], [place] [address]    Report Status PENDING  Incomplete  Blood Culture (routine x 2)     Status: Abnormal (Preliminary result)   Collection Time: 02/10/22  4:45 PM   Specimen: BLOOD RIGHT HAND  Result Value Ref Range Status   Specimen Description   Final    BLOOD RIGHT HAND Performed at [facility], [address], [place], [place] [address]    Special Requests   Final    BOTTLES DRAWN AEROBIC AND ANAEROBIC Blood Culture adequate volume Performed at [place] [place], [place] [address]    Culture  Setup Time   Final    GRAM NEGATIVE RODS Gram Stain Report Called to,Read Back By and Verified With: CHENEY,A@0521  BY MATTHEWS, B 6.27.2023 IN BOTH AEROBIC AND ANAEROBIC BOTTLES CRITICAL RESULT CALLED TO, READ BACK BY AND VERIFIED WITH:  C/ PHARMD ELIZABETH MARTIN 02/11/22 1212 A. LAFRANCE    Culture (A)  Final    ESCHERICHIA COLI SUSCEPTIBILITIES TO FOLLOW Performed at [facility], [place] [address], [place], [place] [address]    Report Status PENDING  Incomplete  Blood Culture ID Panel (Reflexed)     Status:  Abnormal   Collection Time: 02/10/22  4:45 PM  Result Value Ref Range Status   Enterococcus faecalis NOT DETECTED NOT DETECTED Final   Enterococcus Faecium NOT DETECTED NOT DETECTED Final   Listeria monocytogenes NOT DETECTED NOT DETECTED Final   Staphylococcus species NOT DETECTED NOT DETECTED Final   Staphylococcus aureus (BCID) NOT DETECTED NOT DETECTED Final   Staphylococcus epidermidis NOT DETECTED NOT DETECTED Final   Staphylococcus lugdunensis NOT DETECTED NOT DETECTED Final   Streptococcus species NOT DETECTED NOT DETECTED Final   Streptococcus agalactiae NOT DETECTED NOT DETECTED Final   Streptococcus pneumoniae NOT DETECTED NOT DETECTED Final   Streptococcus pyogenes NOT DETECTED NOT DETECTED Final   A.calcoaceticus-baumannii NOT DETECTED NOT  DETECTED Final   Bacteroides fragilis NOT DETECTED NOT DETECTED Final   Enterobacterales DETECTED (A) NOT DETECTED Final    Comment: Enterobacterales represent a large order of gram negative bacteria, not a single organism. CRITICAL RESULT CALLED TO, READ BACK BY AND VERIFIED WITH:  C/ PHARMD ELIZABETH MARTIN 02/11/22 1212 A. LAFRANCE    Enterobacter cloacae complex NOT DETECTED NOT DETECTED Final   Escherichia coli DETECTED (A) NOT DETECTED Final    Comment: CRITICAL RESULT CALLED TO, READ BACK BY AND VERIFIED WITH:  C/ PHARMD ELIZABETH MARTIN 02/11/22 1212 A. LAFRANCE    Klebsiella aerogenes NOT DETECTED NOT DETECTED Final   Klebsiella oxytoca NOT DETECTED NOT DETECTED Final   Klebsiella pneumoniae NOT DETECTED NOT DETECTED Final   Proteus species NOT DETECTED NOT DETECTED Final   Salmonella species NOT DETECTED NOT DETECTED Final   Serratia marcescens NOT DETECTED NOT DETECTED Final   Haemophilus influenzae NOT DETECTED NOT DETECTED Final   Neisseria meningitidis NOT DETECTED NOT DETECTED Final   Pseudomonas aeruginosa NOT DETECTED NOT DETECTED Final   Stenotrophomonas maltophilia NOT DETECTED NOT DETECTED Final   Candida albicans NOT DETECTED NOT DETECTED Final   Candida auris NOT DETECTED NOT DETECTED Final   Candida glabrata NOT DETECTED NOT DETECTED Final   Candida krusei NOT DETECTED NOT DETECTED Final   Candida parapsilosis NOT DETECTED NOT DETECTED Final   Candida tropicalis NOT DETECTED NOT DETECTED Final   Cryptococcus neoformans/gattii NOT DETECTED NOT DETECTED Final   CTX-M ESBL NOT DETECTED NOT DETECTED Final   Carbapenem resistance IMP NOT DETECTED NOT DETECTED Final   Carbapenem resistance KPC NOT DETECTED NOT DETECTED Final   Carbapenem resistance NDM NOT DETECTED NOT DETECTED Final   Carbapenem resist OXA 48 LIKE NOT DETECTED NOT DETECTED Final   Carbapenem resistance VIM NOT DETECTED NOT DETECTED Final    Comment: Performed at [facility], [address] [place], [place] [address]  Resp Panel by RT-PCR (Flu A&B, Covid) Anterior Nasal Swab     Status: None   Collection Time: 02/10/22  4:48 PM   Specimen: Anterior Nasal Swab  Result Value Ref Range Status   SARS Coronavirus 2 by RT PCR NEGATIVE NEGATIVE Final    Comment: (NOTE) SARS-CoV-2 target nucleic acids are NOT DETECTED.  The SARS-CoV-2 RNA is generally detectable in upper respiratory specimens during the acute phase of infection. The lowest concentration of SARS-CoV-2 viral copies this assay can detect is 138 copies/mL. A negative result does not preclude SARS-Cov-2 infection and should not be used as the sole basis for treatment or other patient management decisions. A negative result may occur with  improper specimen collection/handling, submission of specimen other than nasopharyngeal swab, presence of viral mutation(s) within the areas targeted by this assay, and inadequate number of  viral copies(<138 copies/mL). A negative result must be combined with clinical observations, patient history, and epidemiological information. The expected result is Negative.  Fact Sheet for Patients:  EntrepreneurPulse.com.au  Fact Sheet for Healthcare Providers:  IncredibleEmployment.be  This test is no t yet approved or cleared by the [place] FDA and  has been authorized for detection and/or diagnosis of SARS-CoV-2 by FDA under an Emergency Use Authorization (EUA). This EUA will remain  in effect (meaning this test can be used) for the duration of the COVID-19 declaration under Section 564(b)(1) of the Act, 21 U.S.C.section 360bbb-3(b)(1), unless the authorization is terminated  or revoked sooner.       Influenza A by PCR NEGATIVE NEGATIVE Final   Influenza B by PCR NEGATIVE NEGATIVE Final    Comment: (NOTE) The Xpert Xpress SARS-CoV-2/FLU/RSV plus assay is intended as an aid in the diagnosis of influenza from Nasopharyngeal swab specimens  and should not be used as a sole basis for treatment. Nasal washings and aspirates are unacceptable for Xpert Xpress SARS-CoV-2/FLU/RSV testing.  Fact Sheet for Patients: EntrepreneurPulse.com.au  Fact Sheet for Healthcare Providers: IncredibleEmployment.be  This test is not yet approved or cleared by the [place] FDA and has been authorized for detection and/or diagnosis of SARS-CoV-2 by FDA under an Emergency Use Authorization (EUA). This EUA will remain in effect (meaning this test can be used) for the duration of the COVID-19 declaration under Section 564(b)(1) of the Act, 21 U.S.C. section 360bbb-3(b)(1), unless the authorization is terminated or revoked.  Performed at [facility], [address] [place], [place] [address]   Urine Culture     Status: Abnormal (Preliminary result)   Collection Time: 02/10/22  5:05 PM   Specimen: Urine, Clean Catch  Result Value Ref Range Status   Specimen Description   Final    URINE, CLEAN CATCH Performed at [facility], [address], [place], [place] [address]    Special Requests   Final    NONE Performed at [facility], [address], [place], [place] [address]    Culture (A)  Final    >=100,000 COLONIES/mL ESCHERICHIA COLI CULTURE REINCUBATED FOR BETTER GROWTH Performed at [facility], [place] [address] [place], [place] [address]    Report Status PENDING  Incomplete   Organism ID, Bacteria ESCHERICHIA COLI (A)  Final      Susceptibility   Escherichia coli - MIC*    AMPICILLIN <=2 SENSITIVE Sensitive     CEFAZOLIN <=4 SENSITIVE Sensitive     CEFEPIME <=0.12 SENSITIVE Sensitive     CEFTRIAXONE <=0.25 SENSITIVE Sensitive     CIPROFLOXACIN <=0.25 SENSITIVE Sensitive     GENTAMICIN >=16 RESISTANT Resistant     IMIPENEM <=0.25 SENSITIVE Sensitive     NITROFURANTOIN <=16 SENSITIVE Sensitive     TRIMETH/SULFA <=20 SENSITIVE Sensitive     AMPICILLIN/SULBACTAM <=2 SENSITIVE Sensitive      PIP/TAZO <=4 SENSITIVE Sensitive     * >=100,000 COLONIES/mL ESCHERICHIA COLI  Aerobic Culture w Gram Stain (superficial specimen)     Status: None (Preliminary result)   Collection Time: 02/10/22  7:20 PM   Specimen: Abscess  Result Value Ref Range Status   Specimen Description   Final    ABSCESS Performed at [facility], [address], [place], [place] [address]    Special Requests   Final    Normal Performed at [facility], [address], [place], [place] [address]    Gram Stain   Final  NO WBC SEEN RARE GRAM NEGATIVE RODS RARE GRAM POSITIVE RODS    Culture   Final    FEW GRAM NEGATIVE RODS SUSCEPTIBILITIES TO FOLLOW Performed at [facility], [address], [place], [place] [address]    Report Status PENDING  Incomplete  Surgical pcr screen     Status: None   Collection Time: 02/11/22  6:11 PM  Result Value Ref Range Status   MRSA, PCR NEGATIVE NEGATIVE Final   Staphylococcus aureus NEGATIVE NEGATIVE Final    Comment: (NOTE) The Xpert SA Assay (FDA approved for NASAL specimens in patients 48 years of age and older), is one component of a comprehensive surveillance program. It is not intended to diagnose infection nor to guide or monitor treatment. Performed at [facility], [address], [place], [place] [address]      Radiology Studies: MR SHOULDER RIGHT WO CONTRAST  Result Date: 02/11/2022 CLINICAL DATA:  Soft tissue infection suspected. EXAM: MRI OF THE RIGHT SHOULDER WITHOUT CONTRAST TECHNIQUE: Multiplanar, multisequence MR imaging of the shoulder was performed. No intravenous contrast was administered. COMPARISON:  Radiographs dated February 10, 2022 FINDINGS: Multiple sequences are degraded severely limiting evaluation. Skin/soft tissues: There is subcutaneous soft tissue edema about the lateral aspect of the shoulder. There is likely a fluid collection on the lateral aspect of the shoulder, evaluation is however limited due to motion. Rotator  cuff: High-riding humeral head suggesting chronic full-thickness supraspinatus tear. Muscles:  Limited evaluation. Biceps long head:  Limited evaluation. Acromioclavicular Joint:  Moderate acromioclavicular osteoarthritis. Glenohumeral Joint: Small joint effusion and marginal osteophytes. No appreciable fracture. Labrum:  Limited evaluation due to motion. Bones:  No appreciable fracture. IMPRESSION: 1. Subcutaneous soft tissue edema about the lateral aspect of the shoulder with likely a fluid collection, concerning for infectious/inflammatory process. Evaluation is significantly limited due to motion. Further evaluation with CT examination or ultrasound would be helpful. 2. High-riding humeral head suggesting chronic rotator cuff arthropathy with possible supraspinatus full-thickness tear. 3.  Glenohumeral and acromioclavicular osteoarthritis. 2.  Subcutaneous soft tissue Electronically Signed   By: Larose Hires D.O.   On: 02/11/2022 22:52   EEG adult  Result Date: 02/11/2022 Crystal Quest, MD     02/11/2022 12:35 PM Patient Name: Crystal Moses MRN: [id number] Epilepsy Attending: Charlsie Moses Referring Physician/Provider: Narda Bonds, MD Date: 02/11/2022 Duration: 22.44 mins Patient history: 66yo F with the ams. EEG to evaluate for seizure. Level of alertness: Awake, asleep AEDs during EEG study: None Technical aspects: This EEG study was done with scalp electrodes positioned according to the 10-20 International system of electrode placement. Electrical activity was acquired at a sampling rate of 500Hz  and reviewed with a high frequency filter of 70Hz  and a low frequency filter of 1Hz . EEG data were recorded continuously and digitally stored. Description: The posterior dominant rhythm consists of 8-9 Hz activity of moderate voltage (25-35 uV) seen predominantly in posterior head regions, symmetric and reactive to eye opening and eye closing. Sleep was characterized by vertex waves, sleep spindles (12 to  14 Hz), maximal frontocentral region. EEG showed intermittent generalized 3 to 6 Hz theta-delta slowing. Hyperventilation and photic stimulation were not performed.   ABNORMALITY - Intermittent slow, generalized IMPRESSION: This study is suggestive of mild diffuse encephalopathy, nonspecific etiology. No seizures or epileptiform discharges were seen throughout the recording. Priyanka   CT HEAD WO CONTRAST ( )  Result Date: 02/10/2022 CLINICAL DATA:  Altered mental status. EXAM: CT HEAD WITHOUT  CONTRAST TECHNIQUE: Contiguous axial images were obtained from the base of the skull through the vertex without intravenous contrast. RADIATION DOSE REDUCTION: This exam was performed according to the departmental dose-optimization program which includes automated exposure control, adjustment of the mA and/or kV according to patient size and/or use of iterative reconstruction technique. COMPARISON:  November 02, 2020 FINDINGS: Brain: There is mild cerebral atrophy with widening of the extra-axial spaces and ventricular dilatation. There are areas of decreased attenuation within the white matter tracts of the supratentorial brain, consistent with microvascular disease changes. Vascular: No hyperdense vessel or unexpected calcification. Skull: Normal. Negative for fracture or focal lesion. Sinuses/Orbits: There is mild sphenoid sinus mucosal thickening. Other: None. IMPRESSION: 1. No acute intracranial abnormality. 2. Generalized cerebral atrophy with chronic white matter small vessel ischemic changes. Electronically Signed   By: Virgina Norfolk M.D.   On: 02/10/2022 21:40   DG Chest Port 1 View  Result Date: 02/10/2022 CLINICAL DATA:  Generalized weakness, sepsis EXAM: PORTABLE CHEST 1 VIEW COMPARISON:  11/02/2020 FINDINGS: Mild bilateral interstitial thickening likely chronic and accentuated by low lung volumes. No focal consolidation. No pleural effusion or pneumothorax. Heart and mediastinal contours are  unremarkable. No acute osseous abnormality. IMPRESSION: No active disease. Electronically Signed   By: Kathreen Devoid M.D.   On: 02/10/2022 17:08   DG Shoulder Right  Result Date: 02/10/2022 CLINICAL DATA:  Questionable sepsis - evaluate for abnormality. Shoulder pain. EXAM: RIGHT SHOULDER - 2+ VIEW COMPARISON:  None Available. FINDINGS: Soft tissue swelling noted laterally with gas in the soft tissues near the skin surface. Advanced degenerative changes in the right shoulder with joint space loss and spurring. Near complete loss of subacromial space compatible with rotator cuff disease. No acute bony abnormality. Specifically, no fracture, subluxation, or dislocation. No bone destruction to suggest osteomyelitis. IMPRESSION: Soft tissue swelling laterally with gas in the superficial soft tissues. This could reflect soft tissue abscess. Advanced degenerative changes in the right shoulder. No acute bony abnormality. Electronically Signed   By: Rolm Baptise M.D.   On: 02/10/2022 17:05      LOS: 2 days    Barton Dubois MD [facility] 02/12/2022, 4:25 PM   If 7PM-7AM, please contact night-coverage [email or web address]

## 2022-02-12 NOTE — Consult Note (Signed)
[facility]    Date of Admission:  02/10/2022     Total days of antibiotics 2               Reason for Consult: E. Coli Bacteremia and Shoulder Abscess Referring Provider: Dr. Barton Dubois Primary Care Provider: Caprice Renshaw, MD   ASSESSMENT:  Crystal Moses is a 66 y/o female admitted from her skilled nursing facility with altered mental status and weakness and found to have E. Coli bacteremia and septic right glenohumeral joint with complete erosion the of the rotator cuff and deltoid s/p debridement and resection with wound vac placement. She also had E. Coli in her urine although there is no history of urinary symptoms. Septic joint likely a complication of recent contusion that became infected. Given that happened over a month ago will pursue additional work up including repeat blood culture and TTE. May require TEE to rule out endocarditis. Plan to return to the OR on Friday for repeat debridement and possible closure. Narrow antibiotics to ceftriaxone and monitor sensitivities. Post-operative wound care per Orthopedics. Family requesting assistance in finding a place closer to [facility] for skilled care. Appears transition of care order already placed. Remaining medical and supportive care per Primary Team.   PLAN:  Continue current dose of ceftriaxone and discontinue vancomycin.  Recheck blood cultures TTE and possible TEE to rule out endocarditis.  Post-operative wound care per Orthopedics with plan to return to OR on Friday. Remaining medical and supportive care per primary team.    Principal Problem:   Severe sepsis ([place]) Active Problems:   AKI (acute kidney injury) ([place])   Abscess of right shoulder   DM (diabetes mellitus) ([place])   CHF (congestive heart failure) (H[facility])   HTN (hypertension)   Acute metabolic encephalopathy   UTI (urinary tract infection)   E coli bacteremia    ALPRAZolam  0.5 mg Oral TID   chlorhexidine       escitalopram   20 mg Oral Daily   fentaNYL       folic acid  1 mg Oral Daily   heparin  5,000 Units Subcutaneous Q8H     HPI: Crystal Moses is a 66 y.o. female with previous medical history of CAD, CHF, Type 2 diabetes and dementia admitted from her skilled nursing facility with elevated blood pressure, generalized weakness and altered mental status with right shoulder erythema and tenderness.   Crystal Moses is s/p surgery and unable to provide substantial history. History is gleaned from family at bedside and through chart review. Crystal Moses was sustained a contusion to her right shoulder about 1 month ago with unclear onset or direct incident. Able to show a picture of a significantly bruised right shoulder dated 01/14/22. She has not been able to move her arm significantly over the past several weeks and had previously been able to push her wheelchair. She has baseline leg pain which has not changed. It was 02/10/22 when he was  notified of changes in her vital signs and that is when she was brought to the ED.   Febrile on admission with temperature of 100.6 F. Creatinine was elevated at 2.68. Chest x-ray with no active disease. Right shoulder x-ray with soft tissue swelling with gas in the superficial soft tissue possibly reflecting abscess. CT head with no acute intracranial abnormality and MRI of the right shoulder with subcutaneous soft tissue edema with likely fluid collection concerning for infectious/inflammatory process. Ortho consulted and brought  to the OR today, 02/12/22 for I&Dof the right shoulder with concern for possible necrotizing fasciitis.   Crystal Moses's blood cultures from admission are positive for E. Coli. Abscess specimen with gram negative rods and gram positive rods on gram stain and gram negative rods on culture. Surgical specimens obtained today are pending. Initially started on broad spectrum coverage with vancomycin, cefepime and metronidazole. Narrowed to ceftriaxone and vancomycin. Awaiting  sensitivities of previous blood cultures. She has been afebrile. No central lines or implants. Son is requesting case management/transition of care consult to help with potentially finding placement closer to him in [place].   Review of Systems: Review of Systems  Unable to perform ROS: Other  Post surgery   Past Medical History:  Diagnosis Date   Bilateral lower extremity edema    CAD (coronary artery disease)    CHF (congestive heart failure) (HCC)    Diabetes mellitus without complication (HCC)    Hyperlipidemia    Hypertension    SOB (shortness of breath)     Social History   Tobacco Use   Smoking status: Former   Smokeless tobacco: Never  Substance Use Topics   Alcohol use: Yes   Drug use: Not Currently    Family History  Family history unknown: Yes    No Known Allergies  OBJECTIVE: Blood pressure (!) 118/59, pulse (!) 104, temperature 98.4 F (36.9 C), temperature source Oral, resp. rate 15, height 5\' 1"  (1.549 m), weight 116.5 kg, SpO2 93 %.  Physical Exam Constitutional:      General: She is not in acute distress.    Appearance: She is well-developed. She is obese.     Comments: Lying in bed with head of bed elevated; sedated.   Cardiovascular:     Rate and Rhythm: Regular rhythm. Tachycardia present.     Heart sounds: Normal heart sounds.  Pulmonary:     Effort: Pulmonary effort is normal.     Breath sounds: Normal breath sounds.  Musculoskeletal:     Comments: Right shoulder with wound vac in place and functioning. There is bloody drainage in the canister.   Skin:    General: Skin is warm and dry.     Lab Results Lab Results  Component Value Date   WBC 8.1 02/12/2022   HGB 9.5 (L) 02/12/2022   HCT 31.4 (L) 02/12/2022   MCV 96.9 02/12/2022   PLT 136 (L) 02/12/2022    Lab Results  Component Value Date   CREATININE 2.15 (H) 02/12/2022   BUN 64 (H) 02/12/2022   NA 142 02/12/2022   K 4.0 02/12/2022   CL 104 02/12/2022   CO2 24 02/12/2022     Lab Results  Component Value Date   ALT 18 02/10/2022   AST 31 02/10/2022   ALKPHOS 76 02/10/2022   BILITOT 0.6 02/10/2022     Microbiology: Recent Results (from the past 240 hour(s))  Blood Culture (routine x 2)     Status: None (Preliminary result)   Collection Time: 02/10/22  4:40 PM   Specimen: BLOOD LEFT FOREARM  Result Value Ref Range Status   Specimen Description   Final    BLOOD LEFT FOREARM Performed at [facility], [address], [place], [place]    Special Requests   Final    BOTTLES DRAWN AEROBIC AND ANAEROBIC Blood Culture adequate volume Performed at [facility], [address], [place], [place]    Culture  Setup Time   Final    [id number]  NEGATIVE RODS BOTTLES DRAWN AEROBIC AND ANAEROBIC Gram Stain Report Called to,Read Back By and Verified With: CHENEY,A@0521  BY MATTHEWS, B 6.27.2023 Performed at [facility], [address], [place], [place] [address]    Culture   Final    Lonell Grandchild NEGATIVE RODS IDENTIFICATION TO FOLLOW Performed at [facility], [place] [address] [place], [place] [address]    Report Status PENDING  Incomplete  Blood Culture (routine x 2)     Status: Abnormal (Preliminary result)   Collection Time: 02/10/22  4:45 PM   Specimen: BLOOD RIGHT HAND  Result Value Ref Range Status   Specimen Description   Final    BLOOD RIGHT HAND Performed at [facility], [address] [place], [place] [address]    Special Requests   Final    BOTTLES DRAWN AEROBIC AND ANAEROBIC Blood Culture adequate volume Performed at [place] [place], [place] [address]    Culture  Setup Time   Final    GRAM NEGATIVE RODS Gram Stain Report Called to,Read Back By and Verified With: CHENEY,A@0521  BY MATTHEWS, B 6.27.2023 IN BOTH AEROBIC AND ANAEROBIC BOTTLES CRITICAL RESULT CALLED TO, READ BACK BY AND VERIFIED WITH:  C/ PHARMD ELIZABETH MARTIN 02/11/22 1212 A. LAFRANCE    Culture (A)  Final    ESCHERICHIA COLI SUSCEPTIBILITIES TO  FOLLOW Performed at [facility], [place] [address], [place], [place] [address]    Report Status PENDING  Incomplete  Blood Culture ID Panel (Reflexed)     Status: Abnormal   Collection Time: 02/10/22  4:45 PM  Result Value Ref Range Status   Enterococcus faecalis NOT DETECTED NOT DETECTED Final   Enterococcus Faecium NOT DETECTED NOT DETECTED Final   Listeria monocytogenes NOT DETECTED NOT DETECTED Final   Staphylococcus species NOT DETECTED NOT DETECTED Final   Staphylococcus aureus (BCID) NOT DETECTED NOT DETECTED Final   Staphylococcus epidermidis NOT DETECTED NOT DETECTED Final   Staphylococcus lugdunensis NOT DETECTED NOT DETECTED Final   Streptococcus species NOT DETECTED NOT DETECTED Final   Streptococcus agalactiae NOT DETECTED NOT DETECTED Final   Streptococcus pneumoniae NOT DETECTED NOT DETECTED Final   Streptococcus pyogenes NOT DETECTED NOT DETECTED Final   A.calcoaceticus-baumannii NOT DETECTED NOT DETECTED Final   Bacteroides fragilis NOT DETECTED NOT DETECTED Final   Enterobacterales DETECTED (A) NOT DETECTED Final    Comment: Enterobacterales represent a large order of gram negative bacteria, not a single organism. CRITICAL RESULT CALLED TO, READ BACK BY AND VERIFIED WITH:  C/ PHARMD ELIZABETH MARTIN 02/11/22 1212 A. LAFRANCE    Enterobacter cloacae complex NOT DETECTED NOT DETECTED Final   Escherichia coli DETECTED (A) NOT DETECTED Final    Comment: CRITICAL RESULT CALLED TO, READ BACK BY AND VERIFIED WITH:  C/ PHARMD ELIZABETH MARTIN 02/11/22 1212 A. LAFRANCE    Klebsiella aerogenes NOT DETECTED NOT DETECTED Final   Klebsiella oxytoca NOT DETECTED NOT DETECTED Final   Klebsiella pneumoniae NOT DETECTED NOT DETECTED Final   Proteus species NOT DETECTED NOT DETECTED Final   Salmonella species NOT DETECTED NOT DETECTED Final   Serratia marcescens NOT DETECTED NOT DETECTED Final   Haemophilus influenzae NOT DETECTED NOT DETECTED Final   Neisseria meningitidis  NOT DETECTED NOT DETECTED Final   Pseudomonas aeruginosa NOT DETECTED NOT DETECTED Final   Stenotrophomonas maltophilia NOT DETECTED NOT DETECTED Final   Candida albicans NOT DETECTED NOT DETECTED Final   Candida auris NOT DETECTED NOT DETECTED Final   Candida glabrata NOT DETECTED NOT DETECTED  Final   Candida krusei NOT DETECTED NOT DETECTED Final   Candida parapsilosis NOT DETECTED NOT DETECTED Final   Candida tropicalis NOT DETECTED NOT DETECTED Final   Cryptococcus neoformans/gattii NOT DETECTED NOT DETECTED Final   CTX-M ESBL NOT DETECTED NOT DETECTED Final   Carbapenem resistance IMP NOT DETECTED NOT DETECTED Final   Carbapenem resistance KPC NOT DETECTED NOT DETECTED Final   Carbapenem resistance NDM NOT DETECTED NOT DETECTED Final   Carbapenem resist OXA 48 LIKE NOT DETECTED NOT DETECTED Final   Carbapenem resistance VIM NOT DETECTED NOT DETECTED Final    Comment: Performed at [facility], [place] [address] [place], [place] [address]  Resp Panel by RT-PCR (Flu A&B, Covid) Anterior Nasal Swab     Status: None   Collection Time: 02/10/22  4:48 PM   Specimen: Anterior Nasal Swab  Result Value Ref Range Status   SARS Coronavirus 2 by RT PCR NEGATIVE NEGATIVE Final    Comment: (NOTE) SARS-CoV-2 target nucleic acids are NOT DETECTED.  The SARS-CoV-2 RNA is generally detectable in upper respiratory specimens during the acute phase of infection. The lowest concentration of SARS-CoV-2 viral copies this assay can detect is 138 copies/mL. A negative result does not preclude SARS-Cov-2 infection and should not be used as the sole basis for treatment or other patient management decisions. A negative result may occur with  improper specimen collection/handling, submission of specimen other than nasopharyngeal swab, presence of viral mutation(s) within the areas targeted by this assay, and inadequate number of viral copies(<138 copies/mL). A negative result must be combined  with clinical observations, patient history, and epidemiological information. The expected result is Negative.  Fact Sheet for Patients:  EntrepreneurPulse.com.au  Fact Sheet for Healthcare Providers:  IncredibleEmployment.be  This test is no t yet approved or cleared by the [place] FDA and  has been authorized for detection and/or diagnosis of SARS-CoV-2 by FDA under an Emergency Use Authorization (EUA). This EUA will remain  in effect (meaning this test can be used) for the duration of the COVID-19 declaration under Section 564(b)(1) of the Act, 21 U.S.C.section 360bbb-3(b)(1), unless the authorization is terminated  or revoked sooner.       Influenza A by PCR NEGATIVE NEGATIVE Final   Influenza B by PCR NEGATIVE NEGATIVE Final    Comment: (NOTE) The Xpert Xpress SARS-CoV-2/FLU/RSV plus assay is intended as an aid in the diagnosis of influenza from Nasopharyngeal swab specimens and should not be used as a sole basis for treatment. Nasal washings and aspirates are unacceptable for Xpert Xpress SARS-CoV-2/FLU/RSV testing.  Fact Sheet for Patients: EntrepreneurPulse.com.au  Fact Sheet for Healthcare Providers: IncredibleEmployment.be  This test is not yet approved or cleared by the [place] FDA and has been authorized for detection and/or diagnosis of SARS-CoV-2 by FDA under an Emergency Use Authorization (EUA). This EUA will remain in effect (meaning this test can be used) for the duration of the COVID-19 declaration under Section 564(b)(1) of the Act, 21 U.S.C. section 360bbb-3(b)(1), unless the authorization is terminated or revoked.  Performed at [facility], [address], [place], [place] [address]   Urine Culture     Status: Abnormal (Preliminary result)   Collection Time: 02/10/22  5:05 PM   Specimen: Urine, Clean Catch  Result Value Ref Range Status   Specimen Description   Final     URINE, CLEAN CATCH Performed at [facility], [address] [place], [place] [address]    Special Requests   Final  NONE Performed at [facility], [address], [place], [place] [address]    Culture (A)  Final    >=100,000 COLONIES/mL ESCHERICHIA COLI CULTURE REINCUBATED FOR BETTER GROWTH Performed at [facility], [address], [place], [place] [address]    Report Status PENDING  Incomplete   Organism ID, Bacteria ESCHERICHIA COLI (A)  Final      Susceptibility   Escherichia coli - MIC*    AMPICILLIN <=2 SENSITIVE Sensitive     CEFAZOLIN <=4 SENSITIVE Sensitive     CEFEPIME <=0.12 SENSITIVE Sensitive     CEFTRIAXONE <=0.25 SENSITIVE Sensitive     CIPROFLOXACIN <=0.25 SENSITIVE Sensitive     GENTAMICIN >=16 RESISTANT Resistant     IMIPENEM <=0.25 SENSITIVE Sensitive     NITROFURANTOIN <=16 SENSITIVE Sensitive     TRIMETH/SULFA <=20 SENSITIVE Sensitive     AMPICILLIN/SULBACTAM <=2 SENSITIVE Sensitive     PIP/TAZO <=4 SENSITIVE Sensitive     * >=100,000 COLONIES/mL ESCHERICHIA COLI  Aerobic Culture w Gram Stain (superficial specimen)     Status: None (Preliminary result)   Collection Time: 02/10/22  7:20 PM   Specimen: Abscess  Result Value Ref Range Status   Specimen Description   Final    ABSCESS Performed at [facility], [address], [place], [place] [address]    Special Requests   Final    Normal Performed at [facility], [address] [place] [address] [place], [place] [address]    Gram Stain   Final    NO WBC SEEN RARE Romie Minus NEGATIVE RODS RARE GRAM POSITIVE RODS    Culture   Final    FEW GRAM NEGATIVE RODS SUSCEPTIBILITIES TO FOLLOW Performed at [facility], [address], [place], [place] [address]    Report Status PENDING  Incomplete  Surgical pcr screen     Status: None   Collection Time: 02/11/22  6:11 PM  Result Value Ref Range Status   MRSA, PCR NEGATIVE NEGATIVE Final   Staphylococcus aureus NEGATIVE NEGATIVE Final    Comment: (NOTE) The  Xpert SA Assay (FDA approved for NASAL specimens in patients 85 years of age and older), is one component of a comprehensive surveillance program. It is not intended to diagnose infection nor to guide or monitor treatment. Performed at [facility], [address] [place], [place] [address]      Marcos Eke, NP [facility]  02/12/2022  3:06 PM

## 2022-02-12 NOTE — Anesthesia Procedure Notes (Signed)
Procedure Name: Intubation Date/Time: 02/12/2022 11:30 AM  Performed by: Minerva Ends, CRNAPre-anesthesia Checklist: Patient identified, Emergency Drugs available, Suction available and Patient being monitored Patient Re-evaluated:Patient Re-evaluated prior to induction Oxygen Delivery Method: Circle system utilized Preoxygenation: Pre-oxygenation with 100% oxygen Induction Type: IV induction Ventilation: Mask ventilation without difficulty Laryngoscope Size: Mac and 3 Grade View: Grade I Tube type: Oral Tube size: 7.0 mm Number of attempts: 1 Airway Equipment and Method: Stylet and Oral airway Placement Confirmation: ETT inserted through vocal cords under direct vision, positive ETCO2 and breath sounds checked- equal and bilateral Secured at: 21 cm Tube secured with: Tape Dental Injury: Teeth and Oropharynx as per pre-operative assessment

## 2022-02-12 NOTE — Consult Note (Signed)
ORTHOPAEDIC CONSULTATION  REQUESTING PHYSICIAN: Vassie Loll, MD  Chief Complaint: Abscess right shoulder.  HPI: Crystal Moses is a 66 y.o. female who presents with draining abscess right shoulder with radiographic evidence of necrotizing fasciitis.  Patient has a history of type 2 diabetes with hypertension and coronary artery disease.  Past Medical History:  Diagnosis Date   Bilateral lower extremity edema    CAD (coronary artery disease)    CHF (congestive heart failure) (HCC)    Diabetes mellitus without complication (HCC)    Hyperlipidemia    Hypertension    SOB (shortness of breath)    Past Surgical History:  Procedure Laterality Date   APPENDECTOMY     CERVICAL FUSION     CORONARY ANGIOPLASTY WITH STENT PLACEMENT     HERNIA REPAIR     Social History   Socioeconomic History   Marital status: Single    Spouse name: Not on file   Number of children: Not on file   Years of education: Not on file   Highest education level: Not on file  Occupational History   Not on file  Tobacco Use   Smoking status: Former   Smokeless tobacco: Never  Substance and Sexual Activity   Alcohol use: Yes   Drug use: Not Currently   Sexual activity: Not on file  Other Topics Concern   Not on file  Social History Narrative   Not on file   Social Determinants of Health   Financial Resource Strain: Not on file  Food Insecurity: Not on file  Transportation Needs: Not on file  Physical Activity: Not on file  Stress: Not on file  Social Connections: Not on file   Family History  Family history unknown: Yes   - negative except otherwise stated in the family history section No Known Allergies Prior to Admission medications   Medication Sig Start Date End Date Taking? Authorizing Provider  acetaminophen (TYLENOL) 500 MG tablet Take 1,000 mg by mouth every 12 (twelve) hours as needed for fever or moderate pain. >100.1. Not to exceed 3 grams in 24 hours.   Yes [provider]  ALPRAZolam Prudy Feeler) 0.5 MG tablet Take 0.5 mg by mouth in the morning, at noon, and at bedtime. 01/21/22  Yes [provider]  atorvastatin (LIPITOR) 80 MG tablet Take 80 mg by mouth at bedtime. 10/16/20  Yes [provider]  calcium carbonate (TUMS) 500 MG chewable tablet Chew 1 tablet by mouth every 8 (eight) hours as needed for heartburn.   Yes [provider]  diclofenac Sodium (VOLTAREN) 1 % GEL Apply 1 Application topically 4 (four) times daily. 01/31/22  Yes [provider]  divalproex (DEPAKOTE SPRINKLE) 125 MG capsule Take 250 mg by mouth 2 (two) times daily.   Yes [provider]  escitalopram (LEXAPRO) 20 MG tablet Take 20 mg by mouth daily. 01/15/22  Yes [provider]  folic acid (FOLVITE) 1 MG tablet Take 1 mg by mouth daily.   Yes [provider]  gabapentin (NEURONTIN) 300 MG capsule Take 300-600 mg by mouth as directed. Take 1 capsule (300 mg) BID & Take 2 capsules (600 mg) at bedtime   Yes [provider]  ipratropium-albuterol (DUONEB) 0.5-2.5 (3) MG/3ML SOLN Inhale 3 mLs into the lungs every 6 (six) hours as needed (sob/wheezing). 01/03/22  Yes [provider]  lidocaine (LIDODERM) 5 % Place 1 patch onto the skin daily. Remove & Discard patch within 12 hours or as directed by  MD   Yes [provider]  loperamide (IMODIUM A-D) 2 MG tablet Take 2 mg by mouth daily as needed for diarrhea or loose stools.   Yes [provider]  magnesium oxide (MAG-OX) 400 MG tablet Take 400 mg by mouth daily.   Yes [provider]  melatonin 3 MG TABS tablet Take 6 mg by mouth at bedtime.   Yes [provider]  meloxicam (MOBIC) 15 MG tablet Take 15 mg by mouth in the morning and at bedtime.   Yes [provider]  metolazone (ZAROXOLYN) 5 MG tablet Take 5 mg by mouth daily. 01/09/22  Yes [provider]  metoprolol succinate (TOPROL-XL) 25 MG 24 hr tablet Take  1 tablet by mouth daily. 11/14/20  Yes [provider]  oxymetazoline (AFRIN) 0.05 % nasal spray Place 1 spray into both nostrils 2 (two) times daily.   Yes [provider]  polyethylene glycol (MIRALAX / GLYCOLAX) 17 g packet Take 17 g by mouth daily as needed for moderate constipation.   Yes [provider]  Potassium Chloride ER 20 MEQ TBCR Take 1 tablet by mouth daily. 01/20/22  Yes [provider]  sodium chloride (OCEAN) 0.65 % nasal spray Place 1 spray into the nose every 2 (two) hours as needed for congestion.   Yes [provider]  torsemide (DEMADEX) 20 MG tablet Take 40 mg by mouth in the morning.   Yes [provider]  traMADol (ULTRAM) 50 MG tablet Take 50 mg by mouth every 6 (six) hours as needed for moderate pain.   Yes [provider]  acetaminophen (TYLENOL) 325 MG tablet Take 2 tablets (650 mg total) by mouth 3 (three) times daily. Patient not taking: Reported on 02/11/2022 11/06/20   Alberteen Sam, MD   MR SHOULDER RIGHT WO CONTRAST  Result Date: 02/11/2022 CLINICAL DATA:  Soft tissue infection suspected. EXAM: MRI OF THE RIGHT SHOULDER WITHOUT CONTRAST TECHNIQUE: Multiplanar, multisequence MR imaging of the shoulder was performed. No intravenous contrast was administered. COMPARISON:  Radiographs dated February 10, 2022 FINDINGS: Multiple sequences are degraded severely limiting evaluation. Skin/soft tissues: There is subcutaneous soft tissue edema about the lateral aspect of the shoulder. There is likely a fluid collection on the lateral aspect of the shoulder, evaluation is however limited due to motion. Rotator cuff: High-riding humeral head suggesting chronic full-thickness supraspinatus tear. Muscles:  Limited evaluation. Biceps long head:  Limited evaluation. Acromioclavicular Joint:  Moderate acromioclavicular osteoarthritis. Glenohumeral Joint: Small joint effusion and marginal osteophytes. No appreciable fracture.  Labrum:  Limited evaluation due to motion. Bones:  No appreciable fracture. IMPRESSION: 1. Subcutaneous soft tissue edema about the lateral aspect of the shoulder with likely a fluid collection, concerning for infectious/inflammatory process. Evaluation is significantly limited due to motion. Further evaluation with CT examination or ultrasound would be helpful. 2. High-riding humeral head suggesting chronic rotator cuff arthropathy with possible supraspinatus full-thickness tear. 3.  Glenohumeral and acromioclavicular osteoarthritis. 2.  Subcutaneous soft tissue Electronically Signed   By: Larose Hires D.O.   On: 02/11/2022 22:52   EEG adult  Result Date: 02/11/2022 Charlsie Quest, MD     02/11/2022 12:35 PM Patient Name: Crystal Moses MRN: [id number] Epilepsy Attending: Charlsie Quest Referring Physician/Provider: Narda Bonds, MD Date: 02/11/2022 Duration: 22.44 mins Patient history: 66yo F with the ams. EEG to evaluate for seizure. Level of alertness: Awake, asleep AEDs during EEG study: None Technical aspects: This EEG study was done with scalp electrodes  positioned according to the 10-20 International system of electrode placement. Electrical activity was acquired at a sampling rate of 500Hz  and reviewed with a high frequency filter of 70Hz  and a low frequency filter of 1Hz . EEG data were recorded continuously and digitally stored. Description: The posterior dominant rhythm consists of 8-9 Hz activity of moderate voltage (25-35 uV) seen predominantly in posterior head regions, symmetric and reactive to eye opening and eye closing. Sleep was characterized by vertex waves, sleep spindles (12 to 14 Hz), maximal frontocentral region. EEG showed intermittent generalized 3 to 6 Hz theta-delta slowing. Hyperventilation and photic stimulation were not performed.   ABNORMALITY - Intermittent slow, generalized IMPRESSION: This study is suggestive of mild diffuse encephalopathy, nonspecific etiology. No seizures  or epileptiform discharges were seen throughout the recording. Priyanka   CT HEAD WO CONTRAST ( )  Result Date: 02/10/2022 CLINICAL DATA:  Altered mental status. EXAM: CT HEAD WITHOUT CONTRAST TECHNIQUE: Contiguous axial images were obtained from the base of the skull through the vertex without intravenous contrast. RADIATION DOSE REDUCTION: This exam was performed according to the departmental dose-optimization program which includes automated exposure control, adjustment of the mA and/or kV according to patient size and/or use of iterative reconstruction technique. COMPARISON:  November 02, 2020 FINDINGS: Brain: There is mild cerebral atrophy with widening of the extra-axial spaces and ventricular dilatation. There are areas of decreased attenuation within the white matter tracts of the supratentorial brain, consistent with microvascular disease changes. Vascular: No hyperdense vessel or unexpected calcification. Skull: Normal. Negative for fracture or focal lesion. Sinuses/Orbits: There is mild sphenoid sinus mucosal thickening. Other: None. IMPRESSION: 1. No acute intracranial abnormality. 2. Generalized cerebral atrophy with chronic white matter small vessel ischemic changes. Electronically Signed   By: M.D.   On: 02/10/2022 21:40   DG Chest Port 1 View  Result Date: 02/10/2022 CLINICAL DATA:  Generalized weakness, sepsis EXAM: PORTABLE CHEST 1 VIEW COMPARISON:  11/02/2020 FINDINGS: Mild bilateral interstitial thickening likely chronic and accentuated by low lung volumes. No focal consolidation. No pleural effusion or pneumothorax. Heart and mediastinal contours are unremarkable. No acute osseous abnormality. IMPRESSION: No active disease. Electronically Signed   By: 02/12/2022 M.D.   On: 02/10/2022 17:08   DG Shoulder Right  Result Date: 02/10/2022 CLINICAL DATA:  Questionable sepsis - evaluate for abnormality. Shoulder pain. EXAM: RIGHT SHOULDER - 2+ VIEW COMPARISON:   None Available. FINDINGS: Soft tissue swelling noted laterally with gas in the soft tissues near the skin surface. Advanced degenerative changes in the right shoulder with joint space loss and spurring. Near complete loss of subacromial space compatible with rotator cuff disease. No acute bony abnormality. Specifically, no fracture, subluxation, or dislocation. No bone destruction to suggest osteomyelitis. IMPRESSION: Soft tissue swelling laterally with gas in the superficial soft tissues. This could reflect soft tissue abscess. Advanced degenerative changes in the right shoulder. No acute bony abnormality. Electronically Signed   By: Elige Ko M.D.   On: 02/10/2022 17:05   - pertinent xrays, CT, MRI studies were reviewed and independently interpreted  Positive ROS: All other systems have been reviewed and were otherwise negative with the exception of those mentioned in the HPI and as above.  Physical Exam: General: Alert, no acute distress Psychiatric: Patient is competent for consent with normal mood and affect Lymphatic: No axillary or cervical lymphadenopathy Cardiovascular: No pedal edema Respiratory: No cyanosis, no use of accessory musculature GI: No organomegaly, abdomen is soft and non-tender  Images:  @ENCIMAGES @  Labs:  Lab Results  Component Value Date   HGBA1C 5.3 11/02/2020   REPTSTATUS PENDING 02/10/2022   GRAMSTAIN  02/10/2022    NO WBC SEEN RARE GRAM NEGATIVE RODS RARE GRAM POSITIVE RODS Performed at [facility], [address], [place], [place]    CULT PENDING 02/10/2022    Lab Results  Component Value Date   ALBUMIN 3.0 (L) 02/10/2022   ALBUMIN 1.8 (L) 11/05/2020   ALBUMIN 2.0 (L) 11/03/2020   PREALBUMIN 6.3 (L) 11/05/2020        Latest Ref Rng & Units 02/12/2022    2:32 AM 02/11/2022    2:40 AM 02/10/2022    4:40 PM  CBC EXTENDED  WBC 4.0 - 10.5 K/uL 8.1  10.2  10.1   RBC 3.87 - 5.11 MIL/uL 3.24  3.33  3.79   Hemoglobin 12.0 -  15.0 g/dL 9.5  9.9  02/12/2022   HCT 04.8 - 46.0 % 31.4  32.7  36.3   Platelets 150 - 400 K/uL 136  128  142   NEUT# 1.7 - 7.7 K/uL   8.2   Lymph# 0.7 - 4.0 K/uL   0.7     Neurologic: Patient does not have protective sensation bilateral lower extremities.   MUSCULOSKELETAL:   Skin: Examination patient has a purulent draining ulcer from the right shoulder.  Radiograph shows air in the soft tissue.  White cell count 8.1 hemoglobin 9.5 albumin 3.0.  Assessment: Assessment abscess right shoulder possible necrotizing fasciitis.  Plan: Plan: We will plan for irrigation debridement right shoulder Wednesday with follow-up debridement on Friday.  Thank you for the consult and the opportunity to see Ms. Thursday, MD [facility] [phone] 6:55 AM

## 2022-02-12 NOTE — Transfer of Care (Signed)
Immediate Anesthesia Transfer of Care Note  Patient: Crystal Moses  Procedure(s) Performed: RIGHT SHOULDER DEBRIDEMENT WITH RESECTION OF HUMERAL HEAD (Right) APPLICATION OF WOUND VAC  Patient Location: PACU  Anesthesia Type:General  Level of Consciousness: awake  Airway & Oxygen Therapy: Patient Spontanous Breathing and Patient connected to face mask oxygen  Post-op Assessment: Report given to RN and Post -op Vital signs reviewed and stable  Post vital signs: Reviewed and stable  Last Vitals:  Vitals Value Taken Time  BP 130/79 02/12/22 1230  Temp 36.8 C 02/12/22 1230  Pulse 108 02/12/22 1239  Resp 13 02/12/22 1239  SpO2 94 % 02/12/22 1239  Vitals shown include unvalidated device data.  Last Pain:  Vitals:   02/12/22 1230  TempSrc:   PainSc: Asleep      Patients Stated Pain Goal: 0 (02/12/22 0526)  Complications: No notable events documented.

## 2022-02-12 NOTE — Anesthesia Preprocedure Evaluation (Addendum)
Anesthesia Evaluation  Patient identified by MRN, date of birth, ID band Patient awake    Reviewed: Allergy & Precautions, H&P , NPO status , Patient's Chart, lab work & pertinent test results  Airway Mallampati: II   Neck ROM: full    Dental   Pulmonary former smoker,    breath sounds clear to auscultation       Cardiovascular hypertension, + CAD and +CHF   Rhythm:regular Rate:Normal     Neuro/Psych    GI/Hepatic   Endo/Other  diabetesMorbid obesity  Renal/GU Renal InsufficiencyRenal disease     Musculoskeletal   Abdominal   Peds  Hematology  (+) Blood dyscrasia, anemia ,   Anesthesia Other Findings   Reproductive/Obstetrics                            Anesthesia Physical Anesthesia Plan  ASA: 3  Anesthesia Plan: General   Post-op Pain Management:    Induction: Intravenous  PONV Risk Score and Plan: 3 and Ondansetron, Dexamethasone and Treatment may vary due to age or medical condition  Airway Management Planned: Oral ETT  Additional Equipment:   Intra-op Plan:   Post-operative Plan: Extubation in OR  Informed Consent: I have reviewed the patients History and Physical, chart, labs and discussed the procedure including the risks, benefits and alternatives for the proposed anesthesia with the patient or authorized representative who has indicated his/her understanding and acceptance.     Dental advisory given  Plan Discussed with: CRNA, Anesthesiologist and Surgeon  Anesthesia Plan Comments:         Anesthesia Quick Evaluation

## 2022-02-12 NOTE — Op Note (Addendum)
02/12/2022  12:26 PM  PATIENT:  Crystal Moses    PRE-OPERATIVE DIAGNOSIS:  Abscess Right Shoulder  POST-OPERATIVE DIAGNOSIS: Septic glenohumeral joint with complete erosion of the rotator cuff and deltoid muscle.  PROCEDURE:  RIGHT SHOULDER DEBRIDEMENT WITH RESECTION OF HUMERAL HEAD, APPLICATION OF WOUND VAC Application of 1 g vancomycin powder.  SURGEON:  Nadara Mustard, MD  PHYSICIAN ASSISTANT:None ANESTHESIA:   General  PREOPERATIVE INDICATIONS:  Crystal Moses is a  66 y.o. female with a diagnosis of Abscess Right Shoulder who failed conservative measures and elected for surgical management.    The risks benefits and alternatives were discussed with the patient preoperatively including but not limited to the risks of infection, bleeding, nerve injury, cardiopulmonary complications, the need for revision surgery, among others, and the patient was willing to proceed.  OPERATIVE IMPLANTS: Cleanse choice sponge x1 vancomycin powder 1 g.  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  After adequate levels anesthesia were obtained patient's right upper extremity was prepped using DuraPrep draped into a sterile field a timeout was called.  Patient previously had a debridement wound over the lateral aspect of the deltoid.  This surgical wound was incorporated into extensile incision that was essentially lateral to the deltopectoral groove.  This was carried down to fascia there was a large abscess with necrosis of the fascia this was excised there was a large necrotic wound through the deltoid with extensive necrosis of the deltoid.  The humeral head was resected tissue within the glenoid was sent for cultures.  Further deltoid muscle was debrided back to muscle that had  contractility and good color.  There was extensive destruction of the rotator cuff this was resected.  The wound was irrigated with normal saline hemostasis was obtained.  The glenoid was then filled with 1 g of vancomycin powder this was covered with a cleanse choice wound VAC sponge the wound was covered over the sponge and attached to l a wound VAC.  This had a good suction fit patient was extubated taken the PACU in stable condition.   DISCHARGE PLANNING:  Antibiotic duration: Continue IV antibiotics adjust according to cultures  Weightbearing: Not applicable  Pain medication: Opioid pathway  Dressing care/ Wound VAC: Continue wound VAC  Ambulatory devices: Not applicable  Plan for return to the operating room on Friday.  Plan for further debridement possible wound closure.

## 2022-02-13 ENCOUNTER — Encounter (HOSPITAL_COMMUNITY): Payer: Self-pay | Admitting: Orthopedic Surgery

## 2022-02-13 ENCOUNTER — Inpatient Hospital Stay (HOSPITAL_COMMUNITY): Payer: Medicare HMO

## 2022-02-13 DIAGNOSIS — A419 Sepsis, unspecified organism: Secondary | ICD-10-CM | POA: Diagnosis not present

## 2022-02-13 DIAGNOSIS — N179 Acute kidney failure, unspecified: Secondary | ICD-10-CM | POA: Diagnosis not present

## 2022-02-13 DIAGNOSIS — I5032 Chronic diastolic (congestive) heart failure: Secondary | ICD-10-CM | POA: Diagnosis not present

## 2022-02-13 DIAGNOSIS — L02413 Cutaneous abscess of right upper limb: Secondary | ICD-10-CM | POA: Diagnosis not present

## 2022-02-13 LAB — AEROBIC CULTURE W GRAM STAIN (SUPERFICIAL SPECIMEN)
Gram Stain: NONE SEEN
Special Requests: NORMAL

## 2022-02-13 LAB — GLUCOSE, CAPILLARY: Glucose-Capillary: 205 mg/dL — ABNORMAL HIGH (ref 70–99)

## 2022-02-13 LAB — URINE CULTURE: Culture: >=100000 — AB

## 2022-02-13 MED ORDER — LIP MEDEX EX OINT
TOPICAL_OINTMENT | CUTANEOUS | Status: DC | PRN
  Administered 2022-02-13: 75 via TOPICAL
  Filled 2022-02-13: qty 7

## 2022-02-13 MED ORDER — CHLORHEXIDINE GLUCONATE 4 % EX LIQD
60.0000 mL | Freq: Once | CUTANEOUS | Status: AC
  Administered 2022-02-14: 4 via TOPICAL

## 2022-02-13 MED ORDER — POVIDONE-IODINE 10 % EX SWAB
2.0000 | Freq: Once | CUTANEOUS | Status: AC
  Administered 2022-02-14: 2 via TOPICAL

## 2022-02-13 MED ORDER — CEFAZOLIN SODIUM-DEXTROSE 2-4 GM/100ML-% IV SOLN
2.0000 g | INTRAVENOUS | Status: DC
  Filled 2022-02-13: qty 100

## 2022-02-13 NOTE — Care Management Important Message (Signed)
Important Message  Patient Details  Name: Crystal Moses MRN: [id number] Date of Birth: Apr 24, 1956   Medicare Important Message Given:  Yes     Renie Ora 02/13/2022, 8:21 AM

## 2022-02-13 NOTE — Progress Notes (Signed)
PROGRESS NOTE    Crystal Moses  UUV:[id number] DOB: 1955-12-05 DOA: 02/10/2022 PCP: Charlynne Pander, MD   Brief Narrative: Crystal Moses is a 66 y.o. female with a history of congestive heart failure, diabetes mellitus, hypertension, lower extremity edema. Patient presented secondary to altered mental status, weakness and right shoulder pain and found to have a right shoulder abscess and associated sepsis. Empiric antibiotic initiated. I&D performed. Blood cultures significant for E. Coli. Urine culture/wound culture significant for gram negative rods. Orthopedic surgery and infectious disease consulted.   Assessment and Plan: * Severe sepsis (HCC) -Present on admission. Secondary to right shoulder abscess and bacteremia. -Blood cultures significant for E. Coli on BCID. Management as mentione. -acute kidney injury as organ dysfunction appreciated   Abscess of right shoulder -I&D performed bedside in the ED, significant for 200 mLs of purulent fluid (culture obtained and growing E. Coli).  -X-rays significant for soft tissue swelling and gas.  -Orthopedic surgery consulted; patient is status post I&D with removal of humeral head by Dr. Lajoyce Corners on 02/12/22. -Wound VAC in place; planning for second: PCP 02/14/2022. -continue IV antibiotics and analgesics..  -Blood cultures, urine cultures and wound culture suggesting E. coli infection pansensitive. -Continue IV ceftriaxone as recommended by ID. -Echo has been ordered and pending. -Follow recommendations regarding length of therapy.   Acute metabolic encephalopathy -Altered mental status likely secondary to severe sepsis.  -Head CT without acute abnormality.  -EEG demonstrating encephalopathy without acute or specific abnormalities or signs of epileptic waves.   -mentation improved/encephalopathy resolving.  AKI (acute kidney injury) (HCC) -Baseline creatinine of about 1 - 1.1.  -Creatinine 2.68 on admission.   -In setting of severe sepsis.   -Continue holding diuretics and nephrotoxic agents -Continue IV fluids -Follow renal function trend.  Creatinine currently down to 2.2.    E coli bacteremia -continue treatment with rocephin -follow ID recommendations.   UTI (urinary tract infection) -Urine culture significant for E. Coli -continue treatment with rocephin    HTN (hypertension) -Continue Labetalol PRN -BP stable  CHF (congestive heart failure) (HCC) -Diastolic. Stable and compensated. -continue holding Torsemide  -continue Daily weights and strict in/out -continue to Watch fluid status while on IV fluids and NPO  DM (diabetes mellitus) (HCC) -continue modified carb diet -follow CBG's fluctuation -resume diet control as an outpatient. -Hemoglobin A1C of 5.3% from 10/2020.   DVT prophylaxis: Heparin subq Code Status:   Code Status: Full Code Family Communication: Son on telephone Disposition Plan: Discharge back to SNF pending continued medical care and plan for outpatient antibiotic regimen.   Consultants:  Orthopedic surgery Infectious disease  Procedures:  I&D (6/26) 2D echo (ordered and pending).  Antimicrobials: Vancomycin, cefepime and Flagyl given initially suspected COVID and for sepsis at time of admission. Ceftriaxone    Subjective: Breath, no chest pain, no nausea, no vomiting.  Reporting no significant pain in her right shoulder, but patient is calling out and screaming for help.  Objective: BP (!) 105/51   Pulse 97   Temp 97.6 F (36.4 C) (Oral)   Resp 18   Ht 5\' 1"  (1.549 m)   Wt 115.9 kg   SpO2 95%   BMI 48.28 kg/m   Examination: General exam: In mild distress and calling out for help/assistance; on examination expressed no significant pain in her shoulder.  Wound VAC in place.  No fever, no nausea, no vomiting, no chest pain. Respiratory system: Good air movement bilaterally; no using accessory muscles. Cardiovascular system: Rate controlled,  no rubs, no gallops, no  JVD. Gastrointestinal system: Abdomen is nondistended, soft and nontender. No organomegaly or masses felt. Normal bowel sounds heard. Central nervous system:No focal neurological deficits. Extremities: No cyanosis or clubbing; right shoulder with wound VAC in place.  Trace lower extremity edema bilaterally appreciated on exam. Skin: No petechiae. Psychiatry: Flat affect appreciated on examination.   Data Reviewed: I have personally reviewed following labs and imaging studies  CBC Lab Results  Component Value Date   WBC 8.1 02/12/2022   RBC 3.24 (L) 02/12/2022   HGB 9.5 (L) 02/12/2022   HCT 31.4 (L) 02/12/2022   MCV 96.9 02/12/2022   MCH 29.3 02/12/2022   PLT 136 (L) 02/12/2022   MCHC 30.3 02/12/2022   RDW 14.6 02/12/2022   LYMPHSABS 0.7 02/10/2022   MONOABS 1.0 02/10/2022   EOSABS 0.1 02/10/2022   BASOSABS 0.1 02/10/2022     Last metabolic panel Lab Results  Component Value Date   NA 142 02/12/2022   K 4.0 02/12/2022   CL 104 02/12/2022   CO2 24 02/12/2022   BUN 64 (H) 02/12/2022   CREATININE 2.15 (H) 02/12/2022   GLUCOSE 154 (H) 02/12/2022   GFRNONAA 25 (L) 02/12/2022   CALCIUM 8.5 (L) 02/12/2022   PHOS 4.7 (H) 11/03/2020   PROT 8.5 (H) 02/10/2022   ALBUMIN 3.0 (L) 02/10/2022   BILITOT 0.6 02/10/2022   ALKPHOS 76 02/10/2022   AST 31 02/10/2022   ALT 18 02/10/2022   ANIONGAP 14 02/12/2022    GFR: Estimated Creatinine Clearance: 30.5 mL/min (A) (by C-G formula based on SCr of 2.15 mg/dL (H)).  Recent Results (from the past 240 hour(s))  Blood Culture (routine x 2)     Status: Abnormal (Preliminary result)   Collection Time: 02/10/22  4:40 PM   Specimen: BLOOD LEFT FOREARM  Result Value Ref Range Status   Specimen Description   Final    BLOOD LEFT FOREARM Performed at [facility], [address] [place], [place] [address]    Special Requests   Final    BOTTLES DRAWN AEROBIC AND ANAEROBIC Blood Culture adequate volume Performed at [facility],  [address] [place], [place] [address]    Culture  Setup Time   Final    GRAM NEGATIVE RODS BOTTLES DRAWN AEROBIC AND ANAEROBIC Gram Stain Report Called to,Read Back By and Verified With: CHENEY,A@0521  BY MATTHEWS, B 6.27.2023 Performed at [facility], [address] [place], [place] [address]    Culture ESCHERICHIA COLI (A)  Final   Report Status PENDING  Incomplete  Blood Culture (routine x 2)     Status: Abnormal (Preliminary result)   Collection Time: 02/10/22  4:45 PM   Specimen: BLOOD RIGHT HAND  Result Value Ref Range Status   Specimen Description   Final    BLOOD RIGHT HAND Performed at [facility], [address] [place], [place] [address]    Special Requests   Final    BOTTLES DRAWN AEROBIC AND ANAEROBIC Blood Culture adequate volume Performed at [facility], [address] [place], [place] [address]    Culture  Setup Time   Final    GRAM NEGATIVE RODS Gram Stain Report Called to,Read Back By and Verified With: CHENEY,A@0521  BY MATTHEWS, B 6.27.2023 IN BOTH AEROBIC AND ANAEROBIC BOTTLES CRITICAL RESULT CALLED TO, READ BACK BY AND VERIFIED WITH:  C/ PHARMD ELIZABETH MARTIN 02/11/22 1212 A. LAFRANCE    Culture (A)  Final    ESCHERICHIA COLI SUSCEPTIBILITIES TO FOLLOW Performed at [facility], [address], [place], [place] [address]    Report Status PENDING  Incomplete  Blood Culture ID Panel (Reflexed)     Status: Abnormal   Collection Time: 02/10/22  4:45 PM  Result Value Ref Range Status   Enterococcus faecalis NOT DETECTED NOT DETECTED Final   Enterococcus Faecium NOT DETECTED NOT DETECTED Final   Listeria monocytogenes NOT DETECTED NOT DETECTED Final   Staphylococcus species NOT DETECTED NOT DETECTED Final   Staphylococcus aureus (BCID) NOT DETECTED NOT DETECTED Final   Staphylococcus epidermidis NOT DETECTED NOT DETECTED Final   Staphylococcus lugdunensis NOT DETECTED NOT DETECTED Final   Streptococcus species NOT DETECTED NOT DETECTED Final    Streptococcus agalactiae NOT DETECTED NOT DETECTED Final   Streptococcus pneumoniae NOT DETECTED NOT DETECTED Final   Streptococcus pyogenes NOT DETECTED NOT DETECTED Final   A.calcoaceticus-baumannii NOT DETECTED NOT DETECTED Final   Bacteroides fragilis NOT DETECTED NOT DETECTED Final   Enterobacterales DETECTED (A) NOT DETECTED Final    Comment: Enterobacterales represent a large order of gram negative bacteria, not a single organism. CRITICAL RESULT CALLED TO, READ BACK BY AND VERIFIED WITH:  C/ PHARMD ELIZABETH MARTIN 02/11/22 1212 A. LAFRANCE    Enterobacter cloacae complex NOT DETECTED NOT DETECTED Final   Escherichia coli DETECTED (A) NOT DETECTED Final    Comment: CRITICAL RESULT CALLED TO, READ BACK BY AND VERIFIED WITH:  C/ PHARMD ELIZABETH MARTIN 02/11/22 1212 A. LAFRANCE    Klebsiella aerogenes NOT DETECTED NOT DETECTED Final   Klebsiella oxytoca NOT DETECTED NOT DETECTED Final   Klebsiella pneumoniae NOT DETECTED NOT DETECTED Final   Proteus species NOT DETECTED NOT DETECTED Final   Salmonella species NOT DETECTED NOT DETECTED Final   Serratia marcescens NOT DETECTED NOT DETECTED Final   Haemophilus influenzae NOT DETECTED NOT DETECTED Final   Neisseria meningitidis NOT DETECTED NOT DETECTED Final   Pseudomonas aeruginosa NOT DETECTED NOT DETECTED Final   Stenotrophomonas maltophilia NOT DETECTED NOT DETECTED Final   Candida albicans NOT DETECTED NOT DETECTED Final   Candida auris NOT DETECTED NOT DETECTED Final   Candida glabrata NOT DETECTED NOT DETECTED Final   Candida krusei NOT DETECTED NOT DETECTED Final   Candida parapsilosis NOT DETECTED NOT DETECTED Final   Candida tropicalis NOT DETECTED NOT DETECTED Final   Cryptococcus neoformans/gattii NOT DETECTED NOT DETECTED Final   CTX-M ESBL NOT DETECTED NOT DETECTED Final   Carbapenem resistance IMP NOT DETECTED NOT DETECTED Final   Carbapenem resistance KPC NOT DETECTED NOT DETECTED Final   Carbapenem resistance  NDM NOT DETECTED NOT DETECTED Final   Carbapenem resist OXA 48 LIKE NOT DETECTED NOT DETECTED Final   Carbapenem resistance VIM NOT DETECTED NOT DETECTED Final    Comment: Performed at [facility], [address], [place], [place] [address]  Resp Panel by RT-PCR (Flu A&B, Covid) Anterior Nasal Swab     Status: None   Collection Time: 02/10/22  4:48 PM   Specimen: Anterior Nasal Swab  Result Value Ref Range Status   SARS Coronavirus 2 by RT PCR NEGATIVE NEGATIVE Final    Comment: (NOTE) SARS-CoV-2 target nucleic acids are NOT DETECTED.  The SARS-CoV-2 RNA is generally detectable in upper respiratory specimens during the acute phase of infection. The lowest concentration of SARS-CoV-2 viral copies this assay can detect is 138 copies/mL. A negative result does not preclude SARS-Cov-2 infection and should not be used as the sole basis for treatment or other patient management decisions. A negative result  may occur with  improper specimen collection/handling, submission of specimen other than nasopharyngeal swab, presence of viral mutation(s) within the areas targeted by this assay, and inadequate number of viral copies(<138 copies/mL). A negative result must be combined with clinical observations, patient history, and epidemiological information. The expected result is Negative.  Fact Sheet for Patients:  BloggerCourse.comhttps://www.fda.gov/media/152166/download  Fact Sheet for Healthcare Providers:  SeriousBroker.ithttps://www.fda.gov/media/152162/download  This test is no t yet approved or cleared by the [place] FDA and  has been authorized for detection and/or diagnosis of SARS-CoV-2 by FDA under an Emergency Use Authorization (EUA). This EUA will remain  in effect (meaning this test can be used) for the duration of the COVID-19 declaration under Section 564(b)(1) of the Act, 21 U.S.C.section 360bbb-3(b)(1), unless the authorization is terminated  or revoked sooner.       Influenza A by PCR  NEGATIVE NEGATIVE Final   Influenza B by PCR NEGATIVE NEGATIVE Final    Comment: (NOTE) The Xpert Xpress SARS-CoV-2/FLU/RSV plus assay is intended as an aid in the diagnosis of influenza from Nasopharyngeal swab specimens and should not be used as a sole basis for treatment. Nasal washings and aspirates are unacceptable for Xpert Xpress SARS-CoV-2/FLU/RSV testing.  Fact Sheet for Patients: BloggerCourse.comhttps://www.fda.gov/media/152166/download  Fact Sheet for Healthcare Providers: SeriousBroker.ithttps://www.fda.gov/media/152162/download  This test is not yet approved or cleared by the [place] FDA and has been authorized for detection and/or diagnosis of SARS-CoV-2 by FDA under an Emergency Use Authorization (EUA). This EUA will remain in effect (meaning this test can be used) for the duration of the COVID-19 declaration under Section 564(b)(1) of the Act, 21 U.S.C. section 360bbb-3(b)(1), unless the authorization is terminated or revoked.  Performed at [facility], [address] [place], [place] [address]   Urine Culture     Status: Abnormal   Collection Time: 02/10/22  5:05 PM   Specimen: Urine, Clean Catch  Result Value Ref Range Status   Specimen Description   Final    URINE, CLEAN CATCH Performed at [facility], [address] [place], [place] [address]    Special Requests   Final    NONE Performed at [facility], [address] [place], [place] [address]    Culture (A)  Final    >=100,000 COLONIES/mL ESCHERICHIA COLI 30,000 COLONIES/mL VIRIDANS STREPTOCOCCUS Standardized susceptibility testing for this organism is not available. Performed at [facility], [address] [place], [place] [address]    Report Status 02/13/2022 FINAL  Final   Organism ID, Bacteria ESCHERICHIA COLI (A)  Final      Susceptibility   Escherichia coli - MIC*    AMPICILLIN <=2 SENSITIVE Sensitive     CEFAZOLIN <=4 SENSITIVE Sensitive     CEFEPIME <=0.12 SENSITIVE Sensitive     CEFTRIAXONE <=0.25  SENSITIVE Sensitive     CIPROFLOXACIN <=0.25 SENSITIVE Sensitive     GENTAMICIN >=16 RESISTANT Resistant     IMIPENEM <=0.25 SENSITIVE Sensitive     NITROFURANTOIN <=16 SENSITIVE Sensitive     TRIMETH/SULFA <=20 SENSITIVE Sensitive     AMPICILLIN/SULBACTAM <=2 SENSITIVE Sensitive     PIP/TAZO <=4 SENSITIVE Sensitive     * >=100,000 COLONIES/mL ESCHERICHIA COLI  Aerobic Culture w Gram Stain (superficial specimen)     Status: None   Collection Time: 02/10/22  7:20 PM   Specimen: Abscess  Result Value Ref Range Status   Specimen Description   Final    ABSCESS Performed at [facility], [address] [place], [place] [address]  Special Requests   Final    Normal Performed at [facility], [address] [place], [place] [address]    Gram Stain   Final    NO WBC SEEN RARE GRAM NEGATIVE RODS RARE GRAM POSITIVE RODS Performed at [facility], [address], [place], [place] [address]    Culture FEW ESCHERICHIA COLI  Final   Report Status 02/13/2022 FINAL  Final   Organism ID, Bacteria ESCHERICHIA COLI  Final      Susceptibility   Escherichia coli - MIC*    AMPICILLIN <=2 SENSITIVE Sensitive     CEFAZOLIN <=4 SENSITIVE Sensitive     CEFEPIME <=0.12 SENSITIVE Sensitive     CEFTAZIDIME <=1 SENSITIVE Sensitive     CEFTRIAXONE <=0.25 SENSITIVE Sensitive     CIPROFLOXACIN <=0.25 SENSITIVE Sensitive     GENTAMICIN <=1 SENSITIVE Sensitive     IMIPENEM <=0.25 SENSITIVE Sensitive     TRIMETH/SULFA <=20 SENSITIVE Sensitive     AMPICILLIN/SULBACTAM <=2 SENSITIVE Sensitive     PIP/TAZO <=4 SENSITIVE Sensitive     * FEW ESCHERICHIA COLI  Surgical pcr screen     Status: None   Collection Time: 02/11/22  6:11 PM  Result Value Ref Range Status   MRSA, PCR NEGATIVE NEGATIVE Final   Staphylococcus aureus NEGATIVE NEGATIVE Final    Comment: (NOTE) The Xpert SA Assay (FDA approved for NASAL specimens in patients 67 years of age and older), is one component of a  comprehensive surveillance program. It is not intended to diagnose infection nor to guide or monitor treatment. Performed at [facility], [address] [place], [place] [address]   Aerobic/Anaerobic Culture w Gram Stain (surgical/deep wound)     Status: None (Preliminary result)   Collection Time: 02/12/22 11:41 AM   Specimen: Abscess  Result Value Ref Range Status   Specimen Description ABSCESS  Final   Special Requests RIGHT SHOULDER  Final   Gram Stain   Final    FEW WBC PRESENT, PREDOMINANTLY PMN NO ORGANISMS SEEN    Culture   Final    RARE GRAM NEGATIVE RODS IDENTIFICATION AND SUSCEPTIBILITIES TO FOLLOW Performed at [facility], [address], [place], [place] [address]    Report Status PENDING  Incomplete  Culture, blood (Routine X 2) w Reflex to ID Panel     Status: None (Preliminary result)   Collection Time: 02/12/22  4:41 PM   Specimen: BLOOD  Result Value Ref Range Status   Specimen Description BLOOD SITE NOT SPECIFIED  Final   Special Requests   Final    BOTTLES DRAWN AEROBIC AND ANAEROBIC Blood Culture adequate volume   Culture   Final    NO GROWTH < 24 HOURS Performed at [facility], [address] [place], [place] [address]    Report Status PENDING  Incomplete  Culture, blood (Routine X 2) w Reflex to ID Panel     Status: None (Preliminary result)   Collection Time: 02/12/22  5:03 PM   Specimen: BLOOD  Result Value Ref Range Status   Specimen Description BLOOD BLOOD LEFT HAND  Final   Special Requests   Final    BOTTLES DRAWN AEROBIC AND ANAEROBIC Blood Culture adequate volume   Culture   Final    NO GROWTH < 24 HOURS Performed at [facility], [address], [place], [place] [address]    Report Status PENDING  Incomplete     Radiology Studies: MR SHOULDER  RIGHT WO CONTRAST  Result Date: 02/11/2022 CLINICAL DATA:  Soft tissue infection suspected. EXAM: MRI OF THE RIGHT SHOULDER WITHOUT CONTRAST TECHNIQUE: Multiplanar,  multisequence MR imaging of the shoulder was performed. No intravenous contrast was administered. COMPARISON:  Radiographs dated February 10, 2022 FINDINGS: Multiple sequences are degraded severely limiting evaluation. Skin/soft tissues: There is subcutaneous soft tissue edema about the lateral aspect of the shoulder. There is likely a fluid collection on the lateral aspect of the shoulder, evaluation is however limited due to motion. Rotator cuff: High-riding humeral head suggesting chronic full-thickness supraspinatus tear. Muscles:  Limited evaluation. Biceps long head:  Limited evaluation. Acromioclavicular Joint:  Moderate acromioclavicular osteoarthritis. Glenohumeral Joint: Small joint effusion and marginal osteophytes. No appreciable fracture. Labrum:  Limited evaluation due to motion. Bones:  No appreciable fracture. IMPRESSION: 1. Subcutaneous soft tissue edema about the lateral aspect of the shoulder with likely a fluid collection, concerning for infectious/inflammatory process. Evaluation is significantly limited due to motion. Further evaluation with CT examination or ultrasound would be helpful. 2. High-riding humeral head suggesting chronic rotator cuff arthropathy with possible supraspinatus full-thickness tear. 3.  Glenohumeral and acromioclavicular osteoarthritis. 2.  Subcutaneous soft tissue Electronically Signed   By: Larose Hires D.O.   On: 02/11/2022 22:52      LOS: 3 days    Vassie Loll MD [facility] 02/13/2022, 3:47 PM   If 7PM-7AM, please contact night-coverage [email or web address]

## 2022-02-13 NOTE — [place] FL2 (Addendum)
[place] MEDICAID FL2 LEVEL OF CARE SCREENING TOOL     IDENTIFICATION  Patient Name: Crystal Moses Birthdate: 15-Mar-1956 Sex: female Admission Date (Current Location): 02/10/2022  [facility] and [place] Number:  Producer, television/film/video and Address:  [facility], [address] [place], [place] [address]      Provider Number: [id number]  Attending Physician Name and Address:  Vassie Loll, MD  Relative Name and Phone Number:       Current Level of Care: Hospital Recommended Level of Care: Skilled Nursing Facility Prior Approval Number:    Date Approved/Denied:   PASRR Number: [id number]  Discharge Plan: SNF    Current Diagnoses: Patient Active Problem List   Diagnosis Date Noted   UTI (urinary tract infection) 02/11/2022   E coli bacteremia 02/11/2022   Severe sepsis (HCC) 02/10/2022   AKI (acute kidney injury) (HCC) 02/10/2022   Abscess of right shoulder 02/10/2022   DM (diabetes mellitus) (HCC) 02/10/2022   CHF (congestive heart failure) (HCC) 02/10/2022   HTN (hypertension) 02/10/2022   Acute metabolic encephalopathy 02/10/2022   Greater trochanter fracture (HCC) 11/03/2020   Closed left hip fracture, initial encounter (HCC) 11/02/2020    Orientation RESPIRATION BLADDER Height & Weight     Self, Place  O2 (3LNC) Incontinent Weight: 255 lb 8.2 oz (115.9 kg) Height:  5\' 1"  (154.9 cm)  BEHAVIORAL SYMPTOMS/MOOD NEUROLOGICAL BOWEL NUTRITION STATUS      Incontinent Diet (see d/c summary)  AMBULATORY STATUS COMMUNICATION OF NEEDS Skin   Extensive Assist Verbally Surgical wounds (Abcess right shoulder; incised and drained)                       Personal Care Assistance Level of Assistance  Bathing, Feeding, Dressing Bathing Assistance: Maximum assistance Feeding assistance: Independent Dressing Assistance: Maximum assistance     Functional Limitations Info  Sight, Hearing, Speech Sight Info: Impaired Hearing Info: Impaired Speech  Info: Adequate    SPECIAL CARE FACTORS FREQUENCY  OT (By licensed OT), PT (By licensed PT)     PT Frequency: 5x/week OT Frequency: 5x/week            Contractures Contractures Info: Not present    Additional Factors Info  Code Status, Allergies Code Status Info: Full code Allergies Info: no known allergies           Current Medications (02/13/2022):  This is the current hospital active medication list Current Facility-Administered Medications  Medication Dose Route Frequency Provider Last Rate Last Admin   acetaminophen (TYLENOL) tablet 650 mg  650 mg Oral Q6H PRN Emokpae, Ejiroghene E, MD       Or   acetaminophen (TYLENOL) suppository 650 mg  650 mg Rectal Q6H PRN Emokpae, Ejiroghene E, MD       ALPRAZolam 02/15/2022) tablet 0.5 mg  0.5 mg Oral TID Prudy Feeler, MD   0.5 mg at 02/13/22 0801   cefTRIAXone (ROCEPHIN) 2 g in sodium chloride 0.9 % 100 mL IVPB  2 g Intravenous Q24H Emokpae, Ejiroghene E, MD   Stopped at 02/12/22 1737   escitalopram (LEXAPRO) tablet 20 mg  20 mg Oral Daily 02/14/22, MD   20 mg at 02/13/22 0801   folic acid (FOLVITE) tablet 1 mg  1 mg Oral Daily 02/15/22, MD   1 mg at 02/13/22 0801   heparin injection 5,000 Units  5,000 Units Subcutaneous Q8H Emokpae, Ejiroghene E, MD   5,000 Units at 02/13/22  5366   HYDROmorphone (DILAUDID) injection 0.5 mg  0.5 mg Intravenous Q4H PRN Narda Bonds, MD   0.5 mg at 02/13/22 1136   labetalol (NORMODYNE) injection 10 mg  10 mg Intravenous Q2H PRN Emokpae, Ejiroghene E, MD       lactated ringers infusion   Intravenous Continuous Narda Bonds, MD 75 mL/hr at 02/13/22 0639 New Bag at 02/13/22 0639   lip balm (CARMEX) ointment   Topical PRN Nadara Mustard, MD   75 Application at 02/13/22 1136   ondansetron (ZOFRAN) tablet 4 mg  4 mg Oral Q6H PRN Emokpae, Ejiroghene E, MD       Or   ondansetron (ZOFRAN) injection 4 mg  4 mg Intravenous Q6H PRN Emokpae, Ejiroghene E, MD   4 mg at 02/13/22 0635    oxyCODONE-acetaminophen (PERCOCET/ROXICET) 5-325 MG per tablet 1 tablet  1 tablet Oral Q6H PRN Narda Bonds, MD   1 tablet at 02/13/22 1048     Discharge Medications: Please see discharge summary for a list of discharge medications.  Relevant Imaging Results:  Relevant Lab Results:   Additional Information SS #: [id number] Pt needing LTC with medicaid  Heyden Jaber Aris Lot, LCSW

## 2022-02-13 NOTE — H&P (View-Only) (Signed)
Patient ID: Crystal Moses, female   DOB: 05/23/1956, 66 y.o.   MRN: [id number] Patient is seen postoperative day 1 with excision of the humeral head and debridement of a large abscess right shoulder.  Wound VAC is functioning well 350 cc in the wound VAC canister.  Patient states that her shoulder does not hurt but is calling out for ice cream.  Wound cultures are showing gram-negative rods blood cultures were positive for E. coli.  Most likely the same bacteria from her urinary tract infection.  Plan to return to the operating room on Friday.

## 2022-02-13 NOTE — Anesthesia Postprocedure Evaluation (Signed)
Anesthesia Post Note  Patient: ANANIAH Moses  Procedure(s) Performed: RIGHT SHOULDER DEBRIDEMENT WITH RESECTION OF HUMERAL HEAD (Right) APPLICATION OF WOUND VAC     Patient location during evaluation: PACU Anesthesia Type: General Level of consciousness: awake and alert Pain management: pain level controlled Vital Signs Assessment: post-procedure vital signs reviewed and stable Respiratory status: spontaneous breathing, nonlabored ventilation, respiratory function stable and patient connected to nasal cannula oxygen Cardiovascular status: blood pressure returned to baseline and stable Postop Assessment: no apparent nausea or vomiting Anesthetic complications: no   No notable events documented.  Last Vitals:  Vitals:   02/12/22 1925 02/13/22 0330  BP: 125/71 134/73  Pulse: 92 77  Resp: 12 14  Temp: 36.7 C 36.8 C  SpO2: 95% 95%    Last Pain:  Vitals:   02/13/22 0407  TempSrc:   PainSc: Asleep                 Lavette Yankovich S

## 2022-02-13 NOTE — TOC Progression Note (Signed)
Transition of Care [facility]) - Progression Note    Patient Details  Name: Crystal Moses MRN: [id number] Date of Birth: 1956-05-31  Transition of Care [facility]) CM/SW Contact  Leone Haven, RN Phone Number: 02/13/2022, 11:17 AM  Clinical Narrative:    NCM spoke with patient son , Crystal Moses at the bedside, he states she comes from [facility], and he would like for her to probably go to another SNF.  NCM informed him will have CSW to come to speak with him.          Expected Discharge Plan and Services                                                 Social Determinants of Health (SDOH) Interventions    Readmission Risk Interventions     No data to display

## 2022-02-13 NOTE — TOC Progression Note (Signed)
Transition of Care [facility]) - Progression Note    Patient Details  Name: Crystal Moses MRN: [id number] Date of Birth: 22-Nov-1955  Transition of Care [facility]) CM/SW Contact  Erin Sons, [place] Phone Number: 02/13/2022, 12:43 PM  Clinical Narrative:     CSW provided update to [facility] liaison. Notified of possible [facility] which liaison states they should already have one at facility that pt can use.   CSW notified that son would interested in different facility. CSW called pt son. He states pt has been at [facility] for almost 1 year; she is longterm care there under medicaid. He expresses concerns that pt is not receiving good care at facility. He questions whether pt's current wound was not caught early enough by SNF. Other concerns include poor communication, cleanliness issues, and soiled clothes/linens not being changes in a timely way. He is interested in another facility.  Son lives in [place] and has been looking into facilities closer to him. CSW explained that referrals can be made to [facility] and [place] areas for that pt would not be held at the hospital at time of DC. CSW explained LTC beds are more scarce and often arranging LTC is a lengthier process that Son would need to follow up on post-discharge if no other offers at time of DC. CSW explains how to access medicare SNF rating list on [email or web address]. Will complete fl2 and fax bed requests in hub.    Expected Discharge Plan: Skilled Nursing Facility Barriers to Discharge: Continued Medical Work up  Expected Discharge Plan and Services Expected Discharge Plan: Skilled Nursing Facility       Living arrangements for the past 2 months: Skilled Nursing Facility                                       Social Determinants of Health (SDOH) Interventions    Readmission Risk Interventions     No data to display

## 2022-02-13 NOTE — Progress Notes (Signed)
Patient ID: Crystal Moses, female   DOB: 06/30/1956, 65 y.o.   MRN: [id number] Patient is seen postoperative day 1 with excision of the humeral head and debridement of a large abscess right shoulder.  Wound VAC is functioning well 350 cc in the wound VAC canister.  Patient states that her shoulder does not hurt but is calling out for ice cream.  Wound cultures are showing gram-negative rods blood cultures were positive for E. coli.  Most likely the same bacteria from her urinary tract infection.  Plan to return to the operating room on Friday. 

## 2022-02-13 NOTE — Plan of Care (Signed)
  Problem: Clinical Measurements: Goal: Ability to maintain clinical measurements within normal limits will improve Outcome: Progressing   Problem: Clinical Measurements: Goal: Diagnostic test results will improve Outcome: Progressing   Problem: Clinical Measurements: Goal: Respiratory complications will improve Outcome: Progressing   Problem: Clinical Measurements: Goal: Cardiovascular complication will be avoided Outcome: Progressing   Problem: Nutrition: Goal: Adequate nutrition will be maintained Outcome: Progressing   Problem: Coping: Goal: Level of anxiety will decrease Outcome: Progressing   Problem: Pain Managment: Goal: General experience of comfort will improve Outcome: Progressing   Problem: Safety: Goal: Ability to remain free from injury will improve Outcome: Progressing   Problem: Skin Integrity: Goal: Risk for impaired skin integrity will decrease Outcome: Progressing   

## 2022-02-14 ENCOUNTER — Other Ambulatory Visit (HOSPITAL_COMMUNITY): Payer: Medicare HMO

## 2022-02-14 ENCOUNTER — Inpatient Hospital Stay (HOSPITAL_COMMUNITY): Payer: Medicare HMO | Admitting: Anesthesiology

## 2022-02-14 ENCOUNTER — Encounter (HOSPITAL_COMMUNITY): Payer: Self-pay | Admitting: Internal Medicine

## 2022-02-14 ENCOUNTER — Inpatient Hospital Stay: Payer: Self-pay

## 2022-02-14 ENCOUNTER — Other Ambulatory Visit: Payer: Self-pay

## 2022-02-14 ENCOUNTER — Encounter (HOSPITAL_COMMUNITY)
Admission: EM | Disposition: A | Discharge status: Home Health | Payer: Self-pay | Source: Other Acute Inpatient Hospital | Attending: Internal Medicine

## 2022-02-14 DIAGNOSIS — A4151 Sepsis due to Escherichia coli [E. coli]: Principal | ICD-10-CM

## 2022-02-14 DIAGNOSIS — I2510 Atherosclerotic heart disease of native coronary artery without angina pectoris: Secondary | ICD-10-CM | POA: Diagnosis not present

## 2022-02-14 DIAGNOSIS — I110 Hypertensive heart disease with heart failure: Secondary | ICD-10-CM | POA: Diagnosis not present

## 2022-02-14 DIAGNOSIS — I509 Heart failure, unspecified: Secondary | ICD-10-CM

## 2022-02-14 DIAGNOSIS — L02413 Cutaneous abscess of right upper limb: Secondary | ICD-10-CM

## 2022-02-14 DIAGNOSIS — A419 Sepsis, unspecified organism: Secondary | ICD-10-CM | POA: Diagnosis not present

## 2022-02-14 DIAGNOSIS — N179 Acute kidney failure, unspecified: Secondary | ICD-10-CM | POA: Diagnosis not present

## 2022-02-14 DIAGNOSIS — R7881 Bacteremia: Secondary | ICD-10-CM | POA: Diagnosis not present

## 2022-02-14 DIAGNOSIS — I5032 Chronic diastolic (congestive) heart failure: Secondary | ICD-10-CM | POA: Diagnosis not present

## 2022-02-14 DIAGNOSIS — R6520 Severe sepsis without septic shock: Secondary | ICD-10-CM | POA: Diagnosis not present

## 2022-02-14 HISTORY — PX: I & D EXTREMITY: SHX5045

## 2022-02-14 LAB — CBC
HCT: 28.3 % — ABNORMAL LOW (ref 36.0–46.0)
Hemoglobin: 8.3 g/dL — ABNORMAL LOW (ref 12.0–15.0)
MCH: 29.2 pg (ref 26.0–34.0)
MCHC: 29.3 g/dL — ABNORMAL LOW (ref 30.0–36.0)
MCV: 99.6 fL (ref 80.0–100.0)
Platelets: 245 10*3/uL (ref 150–400)
RBC: 2.84 MIL/uL — ABNORMAL LOW (ref 3.87–5.11)
RDW: 15 % (ref 11.5–15.5)
WBC: 11.5 10*3/uL — ABNORMAL HIGH (ref 4.0–10.5)
nRBC: 0.2 % (ref 0.0–0.2)

## 2022-02-14 LAB — CULTURE, BLOOD (ROUTINE X 2)
Special Requests: ADEQUATE
Special Requests: ADEQUATE

## 2022-02-14 LAB — BASIC METABOLIC PANEL
Anion gap: 10 (ref 5–15)
BUN: 68 mg/dL — ABNORMAL HIGH (ref 8–23)
CO2: 26 mmol/L (ref 22–32)
Calcium: 8.5 mg/dL — ABNORMAL LOW (ref 8.9–10.3)
Chloride: 103 mmol/L (ref 98–111)
Creatinine, Ser: 1.86 mg/dL — ABNORMAL HIGH (ref 0.44–1.00)
GFR, Estimated: 30 mL/min — ABNORMAL LOW (ref >60)
Glucose, Bld: 173 mg/dL — ABNORMAL HIGH (ref 70–99)
Potassium: 4.1 mmol/L (ref 3.5–5.1)
Sodium: 139 mmol/L (ref 135–145)

## 2022-02-14 LAB — GLUCOSE, CAPILLARY
Glucose-Capillary: 129 mg/dL — ABNORMAL HIGH (ref 70–99)
Glucose-Capillary: 143 mg/dL — ABNORMAL HIGH (ref 70–99)
Glucose-Capillary: 148 mg/dL — ABNORMAL HIGH (ref 70–99)
Glucose-Capillary: 168 mg/dL — ABNORMAL HIGH (ref 70–99)
Glucose-Capillary: 198 mg/dL — ABNORMAL HIGH (ref 70–99)

## 2022-02-14 SURGERY — IRRIGATION AND DEBRIDEMENT EXTREMITY
Anesthesia: General | Site: Shoulder | Laterality: Right

## 2022-02-14 MED ORDER — PROPOFOL 10 MG/ML IV BOLUS
INTRAVENOUS | Status: DC | PRN
  Administered 2022-02-14: 80 mg via INTRAVENOUS

## 2022-02-14 MED ORDER — ONDANSETRON HCL 4 MG/2ML IJ SOLN
INTRAMUSCULAR | Status: DC | PRN
  Administered 2022-02-14: 4 mg via INTRAVENOUS

## 2022-02-14 MED ORDER — ACETAMINOPHEN 325 MG PO TABS: 325.0000 mg | ORAL_TABLET | Freq: Four times a day (QID) | ORAL | Status: DC | PRN

## 2022-02-14 MED ORDER — SODIUM CHLORIDE 0.9 % IV SOLN: INTRAVENOUS | Status: DC

## 2022-02-14 MED ORDER — METOCLOPRAMIDE HCL 5 MG PO TABS: 5.0000 mg | ORAL_TABLET | Freq: Three times a day (TID) | ORAL | Status: DC | PRN

## 2022-02-14 MED ORDER — POLYETHYLENE GLYCOL 3350 17 G PO PACK: 17.0000 g | PACK | Freq: Every day | ORAL | Status: DC | PRN

## 2022-02-14 MED ORDER — HYDROCODONE-ACETAMINOPHEN 5-325 MG PO TABS
1.0000 | ORAL_TABLET | ORAL | Status: DC | PRN
  Administered 2022-02-14: 1 via ORAL
  Filled 2022-02-14: qty 1

## 2022-02-14 MED ORDER — CHLORHEXIDINE GLUCONATE 0.12 % MT SOLN: 15.0000 mL | Freq: Once | OROMUCOSAL | Status: AC

## 2022-02-14 MED ORDER — MORPHINE SULFATE (PF) 2 MG/ML IV SOLN: 0.5000 mg | INTRAVENOUS | Status: DC | PRN

## 2022-02-14 MED ORDER — HYDROMORPHONE HCL 1 MG/ML IJ SOLN
0.2500 mg | INTRAMUSCULAR | Status: DC | PRN
  Administered 2022-02-14 (×2): 0.5 mg via INTRAVENOUS

## 2022-02-14 MED ORDER — 0.9 % SODIUM CHLORIDE (POUR BTL) OPTIME
TOPICAL | Status: DC | PRN
  Administered 2022-02-14: 1000 mL

## 2022-02-14 MED ORDER — SUGAMMADEX SODIUM 200 MG/2ML IV SOLN
INTRAVENOUS | Status: DC | PRN
  Administered 2022-02-14: 300 mg via INTRAVENOUS

## 2022-02-14 MED ORDER — PROMETHAZINE HCL 25 MG/ML IJ SOLN: 6.2500 mg | INTRAMUSCULAR | Status: DC | PRN

## 2022-02-14 MED ORDER — METHOCARBAMOL 500 MG PO TABS
500.0000 mg | ORAL_TABLET | Freq: Four times a day (QID) | ORAL | Status: DC | PRN
  Administered 2022-02-15 – 2022-02-19 (×10): 500 mg via ORAL
  Filled 2022-02-14 (×10): qty 1

## 2022-02-14 MED ORDER — VANCOMYCIN HCL 1000 MG IV SOLR
INTRAVENOUS | Status: AC
  Filled 2022-02-14: qty 20

## 2022-02-14 MED ORDER — HYDROCODONE-ACETAMINOPHEN 7.5-325 MG PO TABS
1.0000 | ORAL_TABLET | ORAL | Status: DC | PRN
  Administered 2022-02-14: 1 via ORAL
  Administered 2022-02-15 (×3): 2 via ORAL
  Filled 2022-02-14: qty 2
  Filled 2022-02-14: qty 1
  Filled 2022-02-14 (×2): qty 2

## 2022-02-14 MED ORDER — FENTANYL CITRATE (PF) 250 MCG/5ML IJ SOLN
INTRAMUSCULAR | Status: DC | PRN
  Administered 2022-02-14 (×4): 50 ug via INTRAVENOUS

## 2022-02-14 MED ORDER — METOCLOPRAMIDE HCL 5 MG/ML IJ SOLN: 5.0000 mg | Freq: Three times a day (TID) | INTRAMUSCULAR | Status: DC | PRN

## 2022-02-14 MED ORDER — CHLORHEXIDINE GLUCONATE 0.12 % MT SOLN
OROMUCOSAL | Status: AC
  Administered 2022-02-14: 15 mL via OROMUCOSAL
  Filled 2022-02-14: qty 15

## 2022-02-14 MED ORDER — INSULIN ASPART 100 UNIT/ML IJ SOLN: 0.0000 [IU] | INTRAMUSCULAR | Status: DC | PRN

## 2022-02-14 MED ORDER — MIDAZOLAM HCL 2 MG/2ML IJ SOLN
INTRAMUSCULAR | Status: DC | PRN
  Administered 2022-02-14: 2 mg via INTRAVENOUS

## 2022-02-14 MED ORDER — DEXAMETHASONE SODIUM PHOSPHATE 10 MG/ML IJ SOLN
INTRAMUSCULAR | Status: DC | PRN
  Administered 2022-02-14: 4 mg via INTRAVENOUS

## 2022-02-14 MED ORDER — DOCUSATE SODIUM 100 MG PO CAPS
100.0000 mg | ORAL_CAPSULE | Freq: Two times a day (BID) | ORAL | Status: DC
  Administered 2022-02-14 – 2022-02-20 (×8): 100 mg via ORAL
  Filled 2022-02-14 (×8): qty 1

## 2022-02-14 MED ORDER — PHENYLEPHRINE 80 MCG/ML (10ML) SYRINGE FOR IV PUSH (FOR BLOOD PRESSURE SUPPORT)
PREFILLED_SYRINGE | INTRAVENOUS | Status: DC | PRN
  Administered 2022-02-14 (×2): 80 ug via INTRAVENOUS

## 2022-02-14 MED ORDER — HYDROMORPHONE HCL 1 MG/ML IJ SOLN
INTRAMUSCULAR | Status: AC
  Filled 2022-02-14: qty 1

## 2022-02-14 MED ORDER — BISACODYL 10 MG RE SUPP: 10.0000 mg | Freq: Every day | RECTAL | Status: DC | PRN

## 2022-02-14 MED ORDER — PROPOFOL 10 MG/ML IV BOLUS
INTRAVENOUS | Status: AC
  Filled 2022-02-14: qty 20

## 2022-02-14 MED ORDER — LACTATED RINGERS IV SOLN: INTRAVENOUS | Status: DC

## 2022-02-14 MED ORDER — VANCOMYCIN HCL 1000 MG IV SOLR
INTRAVENOUS | Status: DC | PRN
  Administered 2022-02-14: 1000 mg via TOPICAL

## 2022-02-14 MED ORDER — FENTANYL CITRATE (PF) 250 MCG/5ML IJ SOLN
INTRAMUSCULAR | Status: AC
  Filled 2022-02-14: qty 5

## 2022-02-14 MED ORDER — METHOCARBAMOL 1000 MG/10ML IJ SOLN: 500.0000 mg | Freq: Four times a day (QID) | INTRAVENOUS | Status: DC | PRN

## 2022-02-14 MED ORDER — ONDANSETRON HCL 4 MG/2ML IJ SOLN
4.0000 mg | Freq: Four times a day (QID) | INTRAMUSCULAR | Status: DC | PRN
Start: 2022-02-14 — End: 2022-02-14

## 2022-02-14 MED ORDER — ROCURONIUM BROMIDE 10 MG/ML (PF) SYRINGE
PREFILLED_SYRINGE | INTRAVENOUS | Status: DC | PRN
  Administered 2022-02-14: 50 mg via INTRAVENOUS

## 2022-02-14 MED ORDER — MIDAZOLAM HCL 2 MG/2ML IJ SOLN
INTRAMUSCULAR | Status: AC
  Filled 2022-02-14: qty 2

## 2022-02-14 MED ORDER — ORAL CARE MOUTH RINSE: 15.0000 mL | Freq: Once | OROMUCOSAL | Status: AC

## 2022-02-14 MED ORDER — ONDANSETRON HCL 4 MG PO TABS: 4.0000 mg | ORAL_TABLET | Freq: Four times a day (QID) | ORAL | Status: DC | PRN

## 2022-02-14 MED ORDER — LIDOCAINE 2% (20 MG/ML) 5 ML SYRINGE
INTRAMUSCULAR | Status: DC | PRN
  Administered 2022-02-14: 80 mg via INTRAVENOUS

## 2022-02-14 SURGICAL SUPPLY — 37 items
BAG COUNTER SPONGE SURGICOUNT (BAG) IMPLANT
BLADE SURG 21 STRL SS (BLADE) ×2 IMPLANT
BNDG COHESIVE 6X5 TAN STRL LF (GAUZE/BANDAGES/DRESSINGS) IMPLANT
BNDG GAUZE ELAST 4 BULKY (GAUZE/BANDAGES/DRESSINGS) ×4 IMPLANT
CANISTER WOUND CARE 500ML ATS (WOUND CARE) ×1 IMPLANT
COVER SURGICAL LIGHT HANDLE (MISCELLANEOUS) ×4 IMPLANT
DRAIN PENROSE .5X12 LATEX STL (DRAIN) ×1 IMPLANT
DRAPE DERMATAC (DRAPES) ×2 IMPLANT
DRAPE U-SHAPE 47X51 STRL (DRAPES) ×2 IMPLANT
DRAPE U-SHAPE 48X52 POLY STRL (PACKS) ×1 IMPLANT
DRESSING PREVENA PLUS CUSTOM (GAUZE/BANDAGES/DRESSINGS) IMPLANT
DRSG ADAPTIC 3X8 NADH LF (GAUZE/BANDAGES/DRESSINGS) ×2 IMPLANT
DRSG PREVENA PLUS CUSTOM (GAUZE/BANDAGES/DRESSINGS) ×2
DURAPREP 26ML APPLICATOR (WOUND CARE) ×2 IMPLANT
ELECT REM PT RETURN 9FT ADLT (ELECTROSURGICAL)
ELECTRODE REM PT RTRN 9FT ADLT (ELECTROSURGICAL) IMPLANT
GAUZE SPONGE 4X4 12PLY STRL (GAUZE/BANDAGES/DRESSINGS) ×2 IMPLANT
GLOVE BIOGEL PI IND STRL 9 (GLOVE) ×1 IMPLANT
GLOVE BIOGEL PI INDICATOR 9 (GLOVE) ×1
GLOVE SURG ORTHO 9.0 STRL STRW (GLOVE) ×2 IMPLANT
GOWN STRL REUS W/ TWL XL LVL3 (GOWN DISPOSABLE) ×2 IMPLANT
GOWN STRL REUS W/TWL XL LVL3 (GOWN DISPOSABLE) ×2
HANDPIECE INTERPULSE COAX TIP (DISPOSABLE)
KIT BASIN OR (CUSTOM PROCEDURE TRAY) ×2 IMPLANT
KIT TURNOVER KIT B (KITS) ×2 IMPLANT
MANIFOLD NEPTUNE II (INSTRUMENTS) ×2 IMPLANT
NS IRRIG 1000ML POUR BTL (IV SOLUTION) ×2 IMPLANT
PACK ORTHO EXTREMITY (CUSTOM PROCEDURE TRAY) ×2 IMPLANT
PAD ARMBOARD 7.5X6 YLW CONV (MISCELLANEOUS) ×4 IMPLANT
SET HNDPC FAN SPRY TIP SCT (DISPOSABLE) IMPLANT
STOCKINETTE IMPERVIOUS 9X36 MD (GAUZE/BANDAGES/DRESSINGS) IMPLANT
SUT ETHILON 2 0 PSLX (SUTURE) ×3 IMPLANT
SWAB COLLECTION DEVICE MRSA (MISCELLANEOUS) ×2 IMPLANT
SWAB CULTURE ESWAB REG 1ML (MISCELLANEOUS) IMPLANT
TOWEL GREEN STERILE (TOWEL DISPOSABLE) ×2 IMPLANT
TUBE CONNECTING 12X1/4 (SUCTIONS) ×2 IMPLANT
YANKAUER SUCT BULB TIP NO VENT (SUCTIONS) ×2 IMPLANT

## 2022-02-14 NOTE — Progress Notes (Signed)
PROGRESS NOTE    Crystal Moses  ZOX:[id number] DOB: 1956-06-28 DOA: 02/10/2022 PCP: Charlynne Pander, MD   Brief Narrative: Crystal Moses is a 66 y.o. female with a history of congestive heart failure, diabetes mellitus, hypertension, lower extremity edema. Patient presented secondary to altered mental status, weakness and right shoulder pain and found to have a right shoulder abscess and associated sepsis. Empiric antibiotic initiated. I&D performed. Blood cultures significant for E. Coli. Urine culture/wound culture significant for gram negative rods. Orthopedic surgery and infectious disease consulted.   Assessment and Plan: * Severe sepsis (HCC) -Present on admission. Secondary to right shoulder abscess and bacteremia. -Blood cultures significant for E. Coli on BCID. Management as mentione. -acute kidney injury as organ dysfunction appreciated   Abscess of right shoulder -I&D performed bedside in the ED, significant for 200 mLs of purulent fluid (culture obtained and growing E. Coli).  -X-rays significant for soft tissue swelling and gas.  -Orthopedic surgery consulted; patient is status post I&D with removal of humeral head by Dr. Lajoyce Corners on 02/12/22. -Wound VAC in place; planning for second I&D later today 02/14/2022. -continue IV antibiotics and as needed analgesics..  -Blood cultures, urine cultures and wound culture suggesting E. coli infection pansensitive. -Continue IV ceftriaxone as recommended by ID; planning for 6 weeks total. -Echo has been ordered and pending currently.   Acute metabolic encephalopathy -Altered mental status likely secondary to severe sepsis.  -Head CT without acute abnormality.  -EEG demonstrating encephalopathy without acute or specific abnormalities or signs of epileptic waves.   -mentation improved/encephalopathy resolving.  AKI (acute kidney injury) (HCC) -Baseline creatinine of about 1 - 1.1.  -Creatinine 2.68 on admission.   -In setting of severe  sepsis.  -Continue holding diuretics and nephrotoxic agents -Continue IV fluids and maintain adequate hydration. -Follow renal function trend.  Creatinine currently down to 1.86 currently.  -Follow-up renal function trend.   E coli bacteremia -continue treatment with rocephin -follow ID recommendations.   UTI (urinary tract infection) -Urine culture significant for E. Coli -continue treatment with rocephin    HTN (hypertension) -Continue Labetalol PRN -BP stable  CHF (congestive heart failure) (HCC) -Diastolic. Stable and compensated. -continue holding Torsemide  -continue Daily weights and strict in/out -continue to Watch fluid status while on IV fluids and NPO  DM (diabetes mellitus) (HCC) -continue modified carb diet -follow CBG's fluctuation -resume diet control as an outpatient. -Hemoglobin A1C of 5.3% from 10/2020.   DVT prophylaxis: Heparin subq Code Status:   Code Status: Full Code Family Communication: Son on telephone Disposition Plan: Discharge back to SNF pending continued medical care and plan for outpatient antibiotic regimen.   Consultants:  Orthopedic surgery Infectious disease  Procedures:  I&D (6/26) 2D echo (ordered and pending).  Antimicrobials: Vancomycin, cefepime and Flagyl given initially suspected COVID and for sepsis at time of admission. Ceftriaxone    Subjective: No overnight events; appears to be more controlled.  No chest pain, no nausea, no vomiting, no shortness of breath.  Objective: BP 119/63   Pulse 88   Temp 98.2 F (36.8 C)   Resp 13   Ht  (1.549 m)   Wt 119 kg   SpO2 97%   BMI 49.57 kg/m   Examination: General exam: Following simple commands; more comfortable today; no overnight events. Respiratory system: Positive scattered rhonchi; no tachypnea, no wheezing, no crackles, good oxygen saturationOn 2-3 L supplementation. Cardiovascular system:RRR. No rubs, gallops or JVD. Gastrointestinal system: Abdomen  is obese, nondistended, soft  and nontender. No organomegaly or masses felt. Normal bowel sounds heard. Central nervous system: Alert and oriented. No focal neurological deficits. Extremities: No cyanosis or clubbing; right shoulder with wound VAC in place.  Trace edema bilaterally appreciated. Skin: No petechiae. Psychiatry: Flat affect.   Data Reviewed: I have personally reviewed following labs and imaging studies  CBC Lab Results  Component Value Date   WBC 11.5 (H) 02/14/2022   RBC 2.84 (L) 02/14/2022   HGB 8.3 (L) 02/14/2022   HCT 28.3 (L) 02/14/2022   MCV 99.6 02/14/2022   MCH 29.2 02/14/2022   PLT 245 02/14/2022   MCHC 29.3 (L) 02/14/2022   RDW 15.0 02/14/2022   LYMPHSABS 0.7 02/10/2022   MONOABS 1.0 02/10/2022   EOSABS 0.1 02/10/2022   BASOSABS 0.1 02/10/2022     Last metabolic panel Lab Results  Component Value Date   NA 139 02/14/2022   K 4.1 02/14/2022   CL 103 02/14/2022   CO2 26 02/14/2022   BUN 68 (H) 02/14/2022   CREATININE 1.86 (H) 02/14/2022   GLUCOSE 173 (H) 02/14/2022   GFRNONAA 30 (L) 02/14/2022   CALCIUM 8.5 (L) 02/14/2022   PHOS 4.7 (H) 11/03/2020   PROT 8.5 (H) 02/10/2022   ALBUMIN 3.0 (L) 02/10/2022   BILITOT 0.6 02/10/2022   ALKPHOS 76 02/10/2022   AST 31 02/10/2022   ALT 18 02/10/2022   ANIONGAP 10 02/14/2022    GFR: Estimated Creatinine Clearance: 35.8 mL/min (A) (by C-G formula based on SCr of 1.86 mg/dL (H)).  Recent Results (from the past 240 hour(s))  Blood Culture (routine x 2)     Status: Abnormal   Collection Time: 02/10/22  4:40 PM   Specimen: BLOOD LEFT FOREARM  Result Value Ref Range Status   Specimen Description   Final    BLOOD LEFT FOREARM Performed at [facility], [address], [place], [place] [address]    Special Requests   Final    BOTTLES DRAWN AEROBIC AND ANAEROBIC Blood Culture adequate volume Performed at [facility], [address] [place], [place] [address]    Culture  Setup Time   Final    GRAM  NEGATIVE RODS BOTTLES DRAWN AEROBIC AND ANAEROBIC Gram Stain Report Called to,Read Back By and Verified With: CHENEY,A@0521  BY MATTHEWS, B 6.27.2023 Performed at [facility], [address] [place], [place] [address]    Culture (A)  Final    ESCHERICHIA COLI SUSCEPTIBILITIES PERFORMED ON PREVIOUS CULTURE WITHIN THE LAST 5 DAYS. Performed at [facility], [address], [place], [place] [address]    Report Status 02/14/2022 FINAL  Final  Blood Culture (routine x 2)     Status: Abnormal   Collection Time: 02/10/22  4:45 PM   Specimen: BLOOD RIGHT HAND  Result Value Ref Range Status   Specimen Description   Final    BLOOD RIGHT HAND Performed at [facility], [address], [place], [place] [address]    Special Requests   Final    BOTTLES DRAWN AEROBIC AND ANAEROBIC Blood Culture adequate volume Performed at [facility], [address], [place], [place] [address]    Culture  Setup Time   Final    GRAM NEGATIVE RODS Gram Stain Report Called to,Read Back By and Verified With: CHENEY,A@0521  BY MATTHEWS, B 6.27.2023 IN BOTH AEROBIC AND ANAEROBIC BOTTLES CRITICAL RESULT CALLED TO, READ BACK BY AND VERIFIED WITH:  C/ PHARMD ELIZABETH MARTIN 02/11/22 1212 A. LAFRANCE Performed at [facility], [address], [place], [place] [address]    Culture ESCHERICHIA COLI (A)  Final   Report Status 02/14/2022 FINAL  Final   Organism ID, Bacteria ESCHERICHIA COLI  Final      Susceptibility   Escherichia coli - MIC*    AMPICILLIN <=2 SENSITIVE Sensitive     CEFAZOLIN <=4 SENSITIVE Sensitive     CEFEPIME <=0.12 SENSITIVE Sensitive     CEFTAZIDIME <=1 SENSITIVE Sensitive     CEFTRIAXONE <=0.25 SENSITIVE Sensitive     CIPROFLOXACIN <=0.25 SENSITIVE Sensitive     GENTAMICIN <=1 SENSITIVE Sensitive     IMIPENEM <=0.25 SENSITIVE Sensitive     TRIMETH/SULFA <=20 SENSITIVE Sensitive     AMPICILLIN/SULBACTAM <=2 SENSITIVE Sensitive     PIP/TAZO <=4 SENSITIVE Sensitive     * ESCHERICHIA  COLI  Blood Culture ID Panel (Reflexed)     Status: Abnormal   Collection Time: 02/10/22  4:45 PM  Result Value Ref Range Status   Enterococcus faecalis NOT DETECTED NOT DETECTED Final   Enterococcus Faecium NOT DETECTED NOT DETECTED Final   Listeria monocytogenes NOT DETECTED NOT DETECTED Final   Staphylococcus species NOT DETECTED NOT DETECTED Final   Staphylococcus aureus (BCID) NOT DETECTED NOT DETECTED Final   Staphylococcus epidermidis NOT DETECTED NOT DETECTED Final   Staphylococcus lugdunensis NOT DETECTED NOT DETECTED Final   Streptococcus species NOT DETECTED NOT DETECTED Final   Streptococcus agalactiae NOT DETECTED NOT DETECTED Final   Streptococcus pneumoniae NOT DETECTED NOT DETECTED Final   Streptococcus pyogenes NOT DETECTED NOT DETECTED Final   A.calcoaceticus-baumannii NOT DETECTED NOT DETECTED Final   Bacteroides fragilis NOT DETECTED NOT DETECTED Final   Enterobacterales DETECTED (A) NOT DETECTED Final    Comment: Enterobacterales represent a large order of gram negative bacteria, not a single organism. CRITICAL RESULT CALLED TO, READ BACK BY AND VERIFIED WITH:  C/ PHARMD ELIZABETH MARTIN 02/11/22 1212 A. LAFRANCE    Enterobacter cloacae complex NOT DETECTED NOT DETECTED Final   Escherichia coli DETECTED (A) NOT DETECTED Final    Comment: CRITICAL RESULT CALLED TO, READ BACK BY AND VERIFIED WITH:  C/ PHARMD ELIZABETH MARTIN 02/11/22 1212 A. LAFRANCE    Klebsiella aerogenes NOT DETECTED NOT DETECTED Final   Klebsiella oxytoca NOT DETECTED NOT DETECTED Final   Klebsiella pneumoniae NOT DETECTED NOT DETECTED Final   Proteus species NOT DETECTED NOT DETECTED Final   Salmonella species NOT DETECTED NOT DETECTED Final   Serratia marcescens NOT DETECTED NOT DETECTED Final   Haemophilus influenzae NOT DETECTED NOT DETECTED Final   Neisseria meningitidis NOT DETECTED NOT DETECTED Final   Pseudomonas aeruginosa NOT DETECTED NOT DETECTED Final   Stenotrophomonas  maltophilia NOT DETECTED NOT DETECTED Final   Candida albicans NOT DETECTED NOT DETECTED Final   Candida auris NOT DETECTED NOT DETECTED Final   Candida glabrata NOT DETECTED NOT DETECTED Final   Candida krusei NOT DETECTED NOT DETECTED Final   Candida parapsilosis NOT DETECTED NOT DETECTED Final   Candida tropicalis NOT DETECTED NOT DETECTED Final   Cryptococcus neoformans/gattii NOT DETECTED NOT DETECTED Final   CTX-M ESBL NOT DETECTED NOT DETECTED Final   Carbapenem resistance IMP NOT DETECTED NOT DETECTED Final   Carbapenem resistance KPC NOT DETECTED NOT DETECTED Final   Carbapenem resistance NDM NOT DETECTED NOT DETECTED Final   Carbapenem resist OXA 48 LIKE NOT DETECTED NOT DETECTED Final   Carbapenem resistance VIM NOT DETECTED NOT DETECTED Final    Comment: Performed at [facility], [address] [place], [place] [address]  Resp Panel by RT-PCR (Flu A&B, Covid) Anterior Nasal Swab     Status: None   Collection Time: 02/10/22  4:48 PM   Specimen: Anterior Nasal Swab  Result Value Ref Range Status   SARS Coronavirus 2 by RT PCR NEGATIVE NEGATIVE Final    Comment: (NOTE) SARS-CoV-2 target nucleic acids are NOT DETECTED.  The SARS-CoV-2 RNA is generally detectable in upper respiratory specimens during the acute phase of infection. The lowest concentration of SARS-CoV-2 viral copies this assay can detect is 138 copies/mL. A negative result does not preclude SARS-Cov-2 infection and should not be used as the sole basis for treatment or other patient management decisions. A negative result may occur with  improper specimen collection/handling, submission of specimen other than nasopharyngeal swab, presence of viral mutation(s) within the areas targeted by this assay, and inadequate number of viral copies(<138 copies/mL). A negative result must be combined with clinical observations, patient history, and epidemiological information. The expected result is Negative.  Fact  Sheet for Patients:  BloggerCourse.comhttps://www.fda.gov/media/152166/download  Fact Sheet for Healthcare Providers:  SeriousBroker.ithttps://www.fda.gov/media/152162/download  This test is no t yet approved or cleared by the [place] FDA and  has been authorized for detection and/or diagnosis of SARS-CoV-2 by FDA under an Emergency Use Authorization (EUA). This EUA will remain  in effect (meaning this test can be used) for the duration of the COVID-19 declaration under Section 564(b)(1) of the Act, 21 U.S.C.section 360bbb-3(b)(1), unless the authorization is terminated  or revoked sooner.       Influenza A by PCR NEGATIVE NEGATIVE Final   Influenza B by PCR NEGATIVE NEGATIVE Final    Comment: (NOTE) The Xpert Xpress SARS-CoV-2/FLU/RSV plus assay is intended as an aid in the diagnosis of influenza from Nasopharyngeal swab specimens and should not be used as a sole basis for treatment. Nasal washings and aspirates are unacceptable for Xpert Xpress SARS-CoV-2/FLU/RSV testing.  Fact Sheet for Patients: BloggerCourse.comhttps://www.fda.gov/media/152166/download  Fact Sheet for Healthcare Providers: SeriousBroker.ithttps://www.fda.gov/media/152162/download  This test is not yet approved or cleared by the [place] FDA and has been authorized for detection and/or diagnosis of SARS-CoV-2 by FDA under an Emergency Use Authorization (EUA). This EUA will remain in effect (meaning this test can be used) for the duration of the COVID-19 declaration under Section 564(b)(1) of the Act, 21 U.S.C. section 360bbb-3(b)(1), unless the authorization is terminated or revoked.  Performed at [facility], [address] [place], [place] [address]   Urine Culture     Status: Abnormal   Collection Time: 02/10/22  5:05 PM   Specimen: Urine, Clean Catch  Result Value Ref Range Status   Specimen Description   Final    URINE, CLEAN CATCH Performed at [facility], [address] [place], [place] [address]    Special Requests   Final     NONE Performed at [facility], [address] [place], [place] [address]    Culture (A)  Final    >=100,000 COLONIES/mL ESCHERICHIA COLI 30,000 COLONIES/mL VIRIDANS STREPTOCOCCUS Standardized susceptibility testing for this organism is not available. Performed at [facility], [address] [place], [place] [address]    Report Status 02/13/2022 FINAL  Final   Organism ID, Bacteria ESCHERICHIA COLI (A)  Final      Susceptibility   Escherichia coli - MIC*    AMPICILLIN <=2 SENSITIVE Sensitive     CEFAZOLIN <=4 SENSITIVE Sensitive     CEFEPIME <=0.12 SENSITIVE Sensitive     CEFTRIAXONE <=0.25 SENSITIVE Sensitive  CIPROFLOXACIN <=0.25 SENSITIVE Sensitive     GENTAMICIN >=16 RESISTANT Resistant     IMIPENEM <=0.25 SENSITIVE Sensitive     NITROFURANTOIN <=16 SENSITIVE Sensitive     TRIMETH/SULFA <=20 SENSITIVE Sensitive     AMPICILLIN/SULBACTAM <=2 SENSITIVE Sensitive     PIP/TAZO <=4 SENSITIVE Sensitive     * >=100,000 COLONIES/mL ESCHERICHIA COLI  Aerobic Culture w Gram Stain (superficial specimen)     Status: None   Collection Time: 02/10/22  7:20 PM   Specimen: Abscess  Result Value Ref Range Status   Specimen Description   Final    ABSCESS Performed at [facility], [address] [place], [place] [address]    Special Requests   Final    Normal Performed at [facility], [address] [place], [place] [address]    Gram Stain   Final    NO WBC SEEN RARE GRAM NEGATIVE RODS RARE GRAM POSITIVE RODS Performed at [facility], [address] [place], [place] [address]    Culture FEW ESCHERICHIA COLI  Final   Report Status 02/13/2022 FINAL  Final   Organism ID, Bacteria ESCHERICHIA COLI  Final      Susceptibility   Escherichia coli - MIC*    AMPICILLIN <=2 SENSITIVE Sensitive     CEFAZOLIN <=4 SENSITIVE Sensitive     CEFEPIME <=0.12 SENSITIVE Sensitive     CEFTAZIDIME <=1 SENSITIVE Sensitive     CEFTRIAXONE <=0.25 SENSITIVE Sensitive     CIPROFLOXACIN  <=0.25 SENSITIVE Sensitive     GENTAMICIN <=1 SENSITIVE Sensitive     IMIPENEM <=0.25 SENSITIVE Sensitive     TRIMETH/SULFA <=20 SENSITIVE Sensitive     AMPICILLIN/SULBACTAM <=2 SENSITIVE Sensitive     PIP/TAZO <=4 SENSITIVE Sensitive     * FEW ESCHERICHIA COLI  Surgical pcr screen     Status: None   Collection Time: 02/11/22  6:11 PM  Result Value Ref Range Status   MRSA, PCR NEGATIVE NEGATIVE Final   Staphylococcus aureus NEGATIVE NEGATIVE Final    Comment: (NOTE) The Xpert SA Assay (FDA approved for NASAL specimens in patients 92 years of age and older), is one component of a comprehensive surveillance program. It is not intended to diagnose infection nor to guide or monitor treatment. Performed at [facility], [address], [place], [place] [address]   Aerobic/Anaerobic Culture w Gram Stain (surgical/deep wound)     Status: None (Preliminary result)   Collection Time: 02/12/22 11:41 AM   Specimen: Abscess  Result Value Ref Range Status   Specimen Description ABSCESS  Final   Special Requests RIGHT SHOULDER  Final   Gram Stain   Final    FEW WBC PRESENT, PREDOMINANTLY PMN NO ORGANISMS SEEN Performed at [facility], [address], [place], [place] [address]    Culture   Final    RARE ESCHERICHIA COLI NO ANAEROBES ISOLATED; CULTURE IN PROGRESS FOR 5 DAYS    Report Status PENDING  Incomplete   Organism ID, Bacteria ESCHERICHIA COLI  Final      Susceptibility   Escherichia coli - MIC*    AMPICILLIN <=2 SENSITIVE Sensitive     CEFAZOLIN <=4 SENSITIVE Sensitive     CEFEPIME <=0.12 SENSITIVE Sensitive     CEFTAZIDIME <=1 SENSITIVE Sensitive     CEFTRIAXONE <=0.25 SENSITIVE Sensitive     CIPROFLOXACIN <=0.25 SENSITIVE Sensitive     GENTAMICIN <=1 SENSITIVE Sensitive     IMIPENEM <=0.25 SENSITIVE Sensitive     TRIMETH/SULFA <=  20 SENSITIVE Sensitive     AMPICILLIN/SULBACTAM <=2 SENSITIVE Sensitive     PIP/TAZO <=4 SENSITIVE Sensitive     * RARE ESCHERICHIA  COLI  Culture, blood (Routine X 2) w Reflex to ID Panel     Status: None (Preliminary result)   Collection Time: 02/12/22  4:41 PM   Specimen: BLOOD  Result Value Ref Range Status   Specimen Description BLOOD SITE NOT SPECIFIED  Final   Special Requests   Final    BOTTLES DRAWN AEROBIC AND ANAEROBIC Blood Culture adequate volume   Culture   Final    NO GROWTH 2 DAYS Performed at [facility], [address], [place], [place] [address]    Report Status PENDING  Incomplete  Culture, blood (Routine X 2) w Reflex to ID Panel     Status: None (Preliminary result)   Collection Time: 02/12/22  5:03 PM   Specimen: BLOOD  Result Value Ref Range Status   Specimen Description BLOOD BLOOD LEFT HAND  Final   Special Requests   Final    BOTTLES DRAWN AEROBIC AND ANAEROBIC Blood Culture adequate volume   Culture   Final    NO GROWTH 2 DAYS Performed at [facility], [address], [place], [place] [address]    Report Status PENDING  Incomplete     Radiology Studies: No results found.    LOS: 4 days    Vassie Loll MD [facility] 02/14/2022, 4:03 PM   If 7PM-7AM, please contact night-coverage [email or web address]

## 2022-02-14 NOTE — Anesthesia Preprocedure Evaluation (Addendum)
Anesthesia Evaluation  Patient identified by MRN, date of birth, ID band Patient confused    Reviewed: Allergy & Precautions, H&P , NPO status , Patient's Chart, lab work & pertinent test results  Airway Mallampati: II  TM Distance: >3 FB Neck ROM: Full    Dental  (+) Dental Advisory Given, Edentulous Upper, Edentulous Lower   Pulmonary former smoker,    Pulmonary exam normal breath sounds clear to auscultation       Cardiovascular hypertension, Pt. on home beta blockers + CAD and +CHF  Normal cardiovascular exam Rhythm:regular Rate:Normal  Echo 10/2021 1. Left ventricular ejection fraction, by estimation, is 60 to 65%. The left ventricle has normal function. The left ventricle has no regional wall motion abnormalities. There is mild concentric left ventricular hypertrophy. Left ventricular diastolic parameters are consistent with Grade II diastolic dysfunction (pseudonormalization). Elevated left ventricular end-diastolic pressure.  2. Right ventricular systolic function was not well visualized. The right ventricular size is normal. There is normal pulmonary artery systolic pressure. The estimated right ventricular systolic pressure is 22.0 mmHg.  3. Left atrial size was severely dilated.  4. The mitral valve is degenerative. Trivial mitral valve regurgitation. No evidence of mitral stenosis. Severe mitral annular calcification.  5. The aortic valve is tricuspid. Aortic valve regurgitation is not visualized. Aortic valve sclerosis/calcification is present, without any evidence of aortic stenosis.  6. The inferior vena cava is dilated in size with >50% respiratory variability, suggesting right atrial pressure of 8 mmHg.    Neuro/Psych    GI/Hepatic   Endo/Other  diabetesMorbid obesity  Renal/GU Renal InsufficiencyRenal disease     Musculoskeletal   Abdominal (+) + obese,   Peds  Hematology  (+) Blood dyscrasia, anemia  ,   Anesthesia Other Findings   Reproductive/Obstetrics                            Anesthesia Physical  Anesthesia Plan  ASA: 3  Anesthesia Plan: General   Post-op Pain Management: Tylenol PO (pre-op)*   Induction: Intravenous  PONV Risk Score and Plan: 4 or greater and Ondansetron, Dexamethasone, Treatment may vary due to age or medical condition and Midazolam  Airway Management Planned: Oral ETT  Additional Equipment:   Intra-op Plan:   Post-operative Plan: Extubation in OR  Informed Consent:   Plan Discussed with:   Anesthesia Plan Comments:         Anesthesia Quick Evaluation

## 2022-02-14 NOTE — Anesthesia Postprocedure Evaluation (Signed)
Anesthesia Post Note  Patient: Crystal Moses  Procedure(s) Performed: REPEAT DEBRIDEMENT RIGHT SHOULDER (Right: Shoulder)     Patient location during evaluation: PACU Anesthesia Type: General Level of consciousness: sedated and patient cooperative Pain management: pain level controlled Vital Signs Assessment: post-procedure vital signs reviewed and stable Respiratory status: spontaneous breathing Cardiovascular status: stable Anesthetic complications: no   No notable events documented.  Last Vitals:  Vitals:   02/14/22 1046 02/14/22 1101  BP: 116/63 (!) 105/43  Pulse: (!) 101 99  Resp: 10 11  Temp:  36.8 C  SpO2: 94% 91%    Last Pain:  Vitals:   02/14/22 1031  TempSrc:   PainSc: 6                  Keven Osborn [employer]

## 2022-02-14 NOTE — Progress Notes (Signed)
[facility] [facility] for Infectious Disease  Date of Admission:  02/10/2022      Abx: 6/27-c ceftriaxone  ASSESSMENT: Ecoli septicemia Right shoulder om, abscess/myonecrosis, septic glenohumeral joint  6/26 bcx ecoli 6/28 bcx ngtd  S/p I&D 6/28 and 6/30. Wound vac on currently. No tentative plan for repeat I&D at this time  Ecoli pan-sensitive  Echo pending  Will need 6 weeks abx treatment    PLAN: Await tte If tte no concerning endocarditis lesion could dc from id standpoint Opat 6 weeks ceftriaxone starting 6/30 - 8/11 Id clinic f/u below Discussed with primary team    OPAT Orders Discharge antibiotics to be given via PICC line   Duration: 6 weeks from 6/30 End Date: 03/28/22  [facility] Care Per Protocol:  Home health RN for IV administration and teaching; PICC line care and labs.    Labs weekly while on IV antibiotics: _x_ CBC with differential __ BMP _x_ CMP _x_ CRP __ ESR __ Vancomycin trough __ CK  _x_ Please pull PIC at completion of IV antibiotics __ Please leave PIC in place until doctor has seen patient or been notified  Fax weekly labs to [phone]  Clinic Follow Up Appt: 7/24 @ 1045  @  [facility] [place], [place], [place] [address] Phone: [phone]    I spent more than 35 minute reviewing data/chart, and coordinating care and >50% direct face to face time providing counseling/discussing diagnostics/treatment plan with patient    Principal Problem:   Severe sepsis ([place]) Active Problems:   AKI (acute kidney injury) ([place])   Abscess of right shoulder   DM (diabetes mellitus) ([place])   CHF (congestive heart failure) ([facility])   HTN (hypertension)   Acute metabolic encephalopathy   UTI (urinary tract infection)   E coli bacteremia   No Known Allergies  Scheduled Meds:  ALPRAZolam  0.5 mg Oral TID   docusate sodium  100 mg Oral BID   escitalopram  20 mg Oral Daily   folic acid  1 mg Oral Daily    heparin  5,000 Units Subcutaneous Q8H   HYDROmorphone       Continuous Infusions:  sodium chloride 10 mL/hr at 02/14/22 1501   cefTRIAXone (ROCEPHIN)  IV 2 g (02/13/22 1740)   lactated ringers 75 mL/hr at 02/14/22 0935   methocarbamol (ROBAXIN) IV     PRN Meds:.acetaminophen **OR** acetaminophen, bisacodyl, HYDROcodone-acetaminophen, HYDROcodone-acetaminophen, HYDROmorphone, HYDROmorphone (DILAUDID) injection, labetalol, lip balm, methocarbamol **OR** methocarbamol (ROBAXIN) IV, metoCLOPramide **OR** metoCLOPramide (REGLAN) injection, morphine injection, ondansetron **OR** ondansetron (ZOFRAN) IV, polyethylene glycol   SUBJECTIVE: S/p surgery Complains pain right shoulder  Repeat bcx ngtd Tte not yet done  Review of Systems: ROS All other ROS was negative, except mentioned above     OBJECTIVE: Vitals:   02/14/22 1455 02/14/22 1500 02/14/22 1501 02/14/22 1522  BP: 117/74 119/63 119/63   Pulse: 92 88 88 88  Resp: _0 Temp:      TempSrc:      SpO2: 98% 97% 97% 97%  Weight:      Height:       Body mass index is 49.57 kg/m.  Physical Exam  General/constitutional: drowsy HEENT: Normocephalic Neck supple CV: rrr no mrg Lungs: normal respiratory effort Abd: Soft, Nontender Ext: no edema Skin: No Rash Neuro: nonfocal MSK: right shoulder wound vac functioning   Lab Results Lab Results  Component Value Date   WBC 11.5 (H) 02/14/2022  HGB 8.3 (L) 02/14/2022   HCT 28.3 (L) 02/14/2022   MCV 99.6 02/14/2022   PLT 245 02/14/2022    Lab Results  Component Value Date   CREATININE 1.86 (H) 02/14/2022   BUN 68 (H) 02/14/2022   NA 139 02/14/2022   K 4.1 02/14/2022   CL 103 02/14/2022   CO2 26 02/14/2022    Lab Results  Component Value Date   ALT 18 02/10/2022   AST 31 02/10/2022   ALKPHOS 76 02/10/2022   BILITOT 0.6 02/10/2022      Microbiology: Recent Results (from the past 240 hour(s))  Blood Culture (routine x 2)     Status: Abnormal    Collection Time: 02/10/22  4:40 PM   Specimen: BLOOD LEFT FOREARM  Result Value Ref Range Status   Specimen Description   Final    BLOOD LEFT FOREARM Performed at [facility], [address] [place], [place] [address]    Special Requests   Final    BOTTLES DRAWN AEROBIC AND ANAEROBIC Blood Culture adequate volume Performed at [facility], [address], [place], [place] [address]    Culture  Setup Time   Final    GRAM NEGATIVE RODS BOTTLES DRAWN AEROBIC AND ANAEROBIC Gram Stain Report Called to,Read Back By and Verified With: CHENEY,A_0  BY MATTHEWS, B 6.27.2023 Performed at [facility], [address] [place], [place] [address]    Culture (A)  Final    ESCHERICHIA COLI SUSCEPTIBILITIES PERFORMED ON PREVIOUS CULTURE WITHIN THE LAST 5 DAYS. Performed at [facility], [place] [address] [place], [place] [address]    Report Status 02/14/2022 FINAL  Final  Blood Culture (routine x 2)     Status: Abnormal   Collection Time: 02/10/22  4:45 PM   Specimen: BLOOD RIGHT HAND  Result Value Ref Range Status   Specimen Description   Final    BLOOD RIGHT HAND Performed at [facility] [address], [place], [place] [address]    Special Requests   Final    BOTTLES DRAWN AEROBIC AND ANAEROBIC Blood Culture adequate volume Performed at [place] [place], [place] [address]    Culture  Setup Time   Final    GRAM NEGATIVE RODS Gram Stain Report Called to,Read Back By and Verified With: CHENEY,A_1  BY MATTHEWS, B 6.27.2023 IN BOTH AEROBIC AND ANAEROBIC BOTTLES CRITICAL RESULT CALLED TO, READ BACK BY AND VERIFIED WITH:  C/ PHARMD ELIZABETH MARTIN 02/11/22 1212 A. LAFRANCE Performed at [facility], [place] [address] [place], [place] [address]    Culture ESCHERICHIA COLI (A)  Final   Report Status 02/14/2022 FINAL  Final   Organism ID, Bacteria ESCHERICHIA COLI  Final      Susceptibility   Escherichia coli - MIC*    AMPICILLIN <=2 SENSITIVE Sensitive      CEFAZOLIN <=4 SENSITIVE Sensitive     CEFEPIME <=0.12 SENSITIVE Sensitive     CEFTAZIDIME <=1 SENSITIVE Sensitive     CEFTRIAXONE <=0.25 SENSITIVE Sensitive     CIPROFLOXACIN <=0.25 SENSITIVE Sensitive     GENTAMICIN <=1 SENSITIVE Sensitive     IMIPENEM <=0.25 SENSITIVE Sensitive     TRIMETH/SULFA <=20 SENSITIVE Sensitive     AMPICILLIN/SULBACTAM <=2 SENSITIVE Sensitive     PIP/TAZO <=4 SENSITIVE Sensitive     * ESCHERICHIA COLI  Blood Culture ID Panel (Reflexed)     Status: Abnormal   Collection Time: 02/10/22  4:45 PM  Result Value Ref Range Status   Enterococcus  faecalis NOT DETECTED NOT DETECTED Final   Enterococcus Faecium NOT DETECTED NOT DETECTED Final   Listeria monocytogenes NOT DETECTED NOT DETECTED Final   Staphylococcus species NOT DETECTED NOT DETECTED Final   Staphylococcus aureus (BCID) NOT DETECTED NOT DETECTED Final   Staphylococcus epidermidis NOT DETECTED NOT DETECTED Final   Staphylococcus lugdunensis NOT DETECTED NOT DETECTED Final   Streptococcus species NOT DETECTED NOT DETECTED Final   Streptococcus agalactiae NOT DETECTED NOT DETECTED Final   Streptococcus pneumoniae NOT DETECTED NOT DETECTED Final   Streptococcus pyogenes NOT DETECTED NOT DETECTED Final   A.calcoaceticus-baumannii NOT DETECTED NOT DETECTED Final   Bacteroides fragilis NOT DETECTED NOT DETECTED Final   Enterobacterales DETECTED (A) NOT DETECTED Final    Comment: Enterobacterales represent a large order of gram negative bacteria, not a single organism. CRITICAL RESULT CALLED TO, READ BACK BY AND VERIFIED WITH:  C/ PHARMD ELIZABETH MARTIN 02/11/22 1212 A. LAFRANCE    Enterobacter cloacae complex NOT DETECTED NOT DETECTED Final   Escherichia coli DETECTED (A) NOT DETECTED Final    Comment: CRITICAL RESULT CALLED TO, READ BACK BY AND VERIFIED WITH:  C/ PHARMD ELIZABETH MARTIN 02/11/22 1212 A. LAFRANCE    Klebsiella aerogenes NOT DETECTED NOT DETECTED Final   Klebsiella oxytoca NOT DETECTED  NOT DETECTED Final   Klebsiella pneumoniae NOT DETECTED NOT DETECTED Final   Proteus species NOT DETECTED NOT DETECTED Final   Salmonella species NOT DETECTED NOT DETECTED Final   Serratia marcescens NOT DETECTED NOT DETECTED Final   Haemophilus influenzae NOT DETECTED NOT DETECTED Final   Neisseria meningitidis NOT DETECTED NOT DETECTED Final   Pseudomonas aeruginosa NOT DETECTED NOT DETECTED Final   Stenotrophomonas maltophilia NOT DETECTED NOT DETECTED Final   Candida albicans NOT DETECTED NOT DETECTED Final   Candida auris NOT DETECTED NOT DETECTED Final   Candida glabrata NOT DETECTED NOT DETECTED Final   Candida krusei NOT DETECTED NOT DETECTED Final   Candida parapsilosis NOT DETECTED NOT DETECTED Final   Candida tropicalis NOT DETECTED NOT DETECTED Final   Cryptococcus neoformans/gattii NOT DETECTED NOT DETECTED Final   CTX-M ESBL NOT DETECTED NOT DETECTED Final   Carbapenem resistance IMP NOT DETECTED NOT DETECTED Final   Carbapenem resistance KPC NOT DETECTED NOT DETECTED Final   Carbapenem resistance NDM NOT DETECTED NOT DETECTED Final   Carbapenem resist OXA 48 LIKE NOT DETECTED NOT DETECTED Final   Carbapenem resistance VIM NOT DETECTED NOT DETECTED Final    Comment: Performed at [facility], [address], [place], [place] [address]  Resp Panel by RT-PCR (Flu A&B, Covid) Anterior Nasal Swab     Status: None   Collection Time: 02/10/22  4:48 PM   Specimen: Anterior Nasal Swab  Result Value Ref Range Status   SARS Coronavirus 2 by RT PCR NEGATIVE NEGATIVE Final    Comment: (NOTE) SARS-CoV-2 target nucleic acids are NOT DETECTED.  The SARS-CoV-2 RNA is generally detectable in upper respiratory specimens during the acute phase of infection. The lowest concentration of SARS-CoV-2 viral copies this assay can detect is 138 copies/mL. A negative result does not preclude SARS-Cov-2 infection and should not be used as the sole basis for treatment or other patient  management decisions. A negative result may occur with  improper specimen collection/handling, submission of specimen other than nasopharyngeal swab, presence of viral mutation(s) within the areas targeted by this assay, and inadequate number of viral copies(<138 copies/mL). A negative result must be combined with clinical observations, patient history, and epidemiological information. The  expected result is Negative.  Fact Sheet for Patients:  EntrepreneurPulse.com.au  Fact Sheet for Healthcare Providers:  IncredibleEmployment.be  This test is no t yet approved or cleared by the [place] FDA and  has been authorized for detection and/or diagnosis of SARS-CoV-2 by FDA under an Emergency Use Authorization (EUA). This EUA will remain  in effect (meaning this test can be used) for the duration of the COVID-19 declaration under Section 564(b)(1) of the Act, 21 U.S.C.section 360bbb-3(b)(1), unless the authorization is terminated  or revoked sooner.       Influenza A by PCR NEGATIVE NEGATIVE Final   Influenza B by PCR NEGATIVE NEGATIVE Final    Comment: (NOTE) The Xpert Xpress SARS-CoV-2/FLU/RSV plus assay is intended as an aid in the diagnosis of influenza from Nasopharyngeal swab specimens and should not be used as a sole basis for treatment. Nasal washings and aspirates are unacceptable for Xpert Xpress SARS-CoV-2/FLU/RSV testing.  Fact Sheet for Patients: EntrepreneurPulse.com.au  Fact Sheet for Healthcare Providers: IncredibleEmployment.be  This test is not yet approved or cleared by the [place] FDA and has been authorized for detection and/or diagnosis of SARS-CoV-2 by FDA under an Emergency Use Authorization (EUA). This EUA will remain in effect (meaning this test can be used) for the duration of the COVID-19 declaration under Section 564(b)(1) of the Act, 21 U.S.C. section 360bbb-3(b)(1),  unless the authorization is terminated or revoked.  Performed at [facility], [address], [place], [place] [address]   Urine Culture     Status: Abnormal   Collection Time: 02/10/22  5:05 PM   Specimen: Urine, Clean Catch  Result Value Ref Range Status   Specimen Description   Final    URINE, CLEAN CATCH Performed at [facility], [address] [place], [place] [address]    Special Requests   Final    NONE Performed at [facility], [address], [place], [place] [address]    Culture (A)  Final    >=100,000 COLONIES/mL ESCHERICHIA COLI 30,000 COLONIES/mL VIRIDANS STREPTOCOCCUS Standardized susceptibility testing for this organism is not available. Performed at [facility], [place] [address] [place], [place] [address]    Report Status 02/13/2022 FINAL  Final   Organism ID, Bacteria ESCHERICHIA COLI (A)  Final      Susceptibility   Escherichia coli - MIC*    AMPICILLIN <=2 SENSITIVE Sensitive     CEFAZOLIN <=4 SENSITIVE Sensitive     CEFEPIME <=0.12 SENSITIVE Sensitive     CEFTRIAXONE <=0.25 SENSITIVE Sensitive     CIPROFLOXACIN <=0.25 SENSITIVE Sensitive     GENTAMICIN >=16 RESISTANT Resistant     IMIPENEM <=0.25 SENSITIVE Sensitive     NITROFURANTOIN <=16 SENSITIVE Sensitive     TRIMETH/SULFA <=20 SENSITIVE Sensitive     AMPICILLIN/SULBACTAM <=2 SENSITIVE Sensitive     PIP/TAZO <=4 SENSITIVE Sensitive     * >=100,000 COLONIES/mL ESCHERICHIA COLI  Aerobic Culture w Gram Stain (superficial specimen)     Status: None   Collection Time: 02/10/22  7:20 PM   Specimen: Abscess  Result Value Ref Range Status   Specimen Description   Final    ABSCESS Performed at [facility], [address] [place], [place] [address]    Special Requests   Final    Normal Performed at [facility], [address], [place],  [address]    Gram Stain   Final    NO WBC SEEN RARE GRAM NEGATIVE RODS RARE GRAM POSITIVE RODS Performed at [facility], [place] [address]  [place], [place] [address]    Culture FEW ESCHERICHIA COLI  Final   Report Status 02/13/2022 FINAL  Final   Organism ID, Bacteria ESCHERICHIA COLI  Final      Susceptibility   Escherichia coli - MIC*    AMPICILLIN <=2 SENSITIVE Sensitive     CEFAZOLIN <=4 SENSITIVE Sensitive     CEFEPIME <=0.12 SENSITIVE Sensitive     CEFTAZIDIME <=1 SENSITIVE Sensitive     CEFTRIAXONE <=0.25 SENSITIVE Sensitive     CIPROFLOXACIN <=0.25 SENSITIVE Sensitive     GENTAMICIN <=1 SENSITIVE Sensitive     IMIPENEM <=0.25 SENSITIVE Sensitive     TRIMETH/SULFA <=20 SENSITIVE Sensitive     AMPICILLIN/SULBACTAM <=2 SENSITIVE Sensitive     PIP/TAZO <=4 SENSITIVE Sensitive     * FEW ESCHERICHIA COLI  Surgical pcr screen     Status: None   Collection Time: 02/11/22  6:11 PM  Result Value Ref Range Status   MRSA, PCR NEGATIVE NEGATIVE Final   Staphylococcus aureus NEGATIVE NEGATIVE Final    Comment: (NOTE) The Xpert SA Assay (FDA approved for NASAL specimens in patients 55 years of age and older), is one component of a comprehensive surveillance program. It is not intended to diagnose infection nor to guide or monitor treatment. Performed at [facility], [place] [address] [place], [place] [address]   Aerobic/Anaerobic Culture w Gram Stain (surgical/deep wound)     Status: None (Preliminary result)   Collection Time: 02/12/22 11:41 AM   Specimen: Abscess  Result Value Ref Range Status   Specimen Description ABSCESS  Final   Special Requests RIGHT SHOULDER  Final   Gram Stain   Final    FEW WBC PRESENT, PREDOMINANTLY PMN NO ORGANISMS SEEN Performed at [facility], [place] [address], [place], [place] [address]    Culture   Final    RARE ESCHERICHIA COLI NO ANAEROBES ISOLATED; CULTURE IN PROGRESS FOR 5 DAYS    Report Status PENDING  Incomplete   Organism ID, Bacteria ESCHERICHIA COLI  Final      Susceptibility   Escherichia coli - MIC*    AMPICILLIN <=2 SENSITIVE Sensitive     CEFAZOLIN <=4  SENSITIVE Sensitive     CEFEPIME <=0.12 SENSITIVE Sensitive     CEFTAZIDIME <=1 SENSITIVE Sensitive     CEFTRIAXONE <=0.25 SENSITIVE Sensitive     CIPROFLOXACIN <=0.25 SENSITIVE Sensitive     GENTAMICIN <=1 SENSITIVE Sensitive     IMIPENEM <=0.25 SENSITIVE Sensitive     TRIMETH/SULFA <=20 SENSITIVE Sensitive     AMPICILLIN/SULBACTAM <=2 SENSITIVE Sensitive     PIP/TAZO <=4 SENSITIVE Sensitive     * RARE ESCHERICHIA COLI  Culture, blood (Routine X 2) w Reflex to ID Panel     Status: None (Preliminary result)   Collection Time: 02/12/22  4:41 PM   Specimen: BLOOD  Result Value Ref Range Status   Specimen Description BLOOD SITE NOT SPECIFIED  Final   Special Requests   Final    BOTTLES DRAWN AEROBIC AND ANAEROBIC Blood Culture adequate volume   Culture   Final    NO GROWTH 2 DAYS Performed at [facility], [address] [place], [place] [address]    Report Status PENDING  Incomplete  Culture, blood (Routine X 2) w Reflex to ID Panel     Status: None (Preliminary result)   Collection Time: 02/12/22  5:03 PM   Specimen: BLOOD  Result Value Ref  Range Status   Specimen Description BLOOD BLOOD LEFT HAND  Final   Special Requests   Final    BOTTLES DRAWN AEROBIC AND ANAEROBIC Blood Culture adequate volume   Culture   Final    NO GROWTH 2 DAYS Performed at [facility], [address] [place], [place] [address]    Report Status PENDING  Incomplete     Serology:   Imaging: If present, new imagings (plain films, ct scans, and mri) have been personally visualized and interpreted; radiology reports have been reviewed. Decision making incorporated into the Impression / Recommendations.  6/27 mri right shoulder noncontrast 1. Subcutaneous soft tissue edema about the lateral aspect of the shoulder with likely a fluid collection, concerning for infectious/inflammatory process. Evaluation is significantly limited due to motion. Further evaluation with CT examination or  ultrasound would be helpful.   2. High-riding humeral head suggesting chronic rotator cuff arthropathy with possible supraspinatus full-thickness tear.   3.  Glenohumeral and acromioclavicular osteoarthritis.   2.  Subcutaneous soft tissue   Jabier Mutton, [place] for Infectious [employer] [phone] pager    02/14/2022, 3:53 PM

## 2022-02-14 NOTE — TOC Progression Note (Addendum)
Transition of Care [facility]) - Progression Note    Patient Details  Name: Crystal Moses MRN: [id number] Date of Birth: 1955/09/20  [facility] CM/SW Contact  Erin Sons, [place] Phone Number: 02/14/2022, 4:47 PM  Clinical Narrative:     CSW updated [facility] SNF liaison on pt progress. SNF contact through the holiday will be Alesia Banda [phone]  CSW called pt son and provided him with the 2 facilities that may be options for LTC that he can look into post-discharge. CSW explained  that hospital would not be able to make arrangement of new LTC facilities with current anticipated DC date. CSW provided explained son would need to follow up with these facilities post discharge.   CSW messaged Alesia Banda to update regarding possible DC this weekend  Expected Discharge Plan: Skilled Nursing Facility Barriers to Discharge: Continued Medical Work up  Expected Discharge Plan and Services Expected Discharge Plan: Skilled Nursing Facility       Living arrangements for the past 2 months: Skilled Nursing Facility                                       Social Determinants of Health (SDOH) Interventions    Readmission Risk Interventions     No data to display

## 2022-02-14 NOTE — Progress Notes (Signed)
PHARMACY CONSULT NOTE FOR:  OUTPATIENT  PARENTERAL ANTIBIOTIC THERAPY (OPAT)  Indication: E.coli R-shoulder abscess/osteo Regimen: Rocephin 2g IV every 24 hours End date: 03/28/22  IV antibiotic discharge orders are pended. To discharging provider:  please sign these orders via discharge navigator,  Select New Orders & click on the button choice - Manage This Unsigned Work.     Thank you for allowing pharmacy to be a part of this patient's care.  Georgina Pillion, PharmD, BCPS Infectious Diseases Clinical Pharmacist 02/14/2022 4:03 PM   **Pharmacist phone directory can now be found on [email or web address] ([email or web address]).  Listed under [facility].

## 2022-02-14 NOTE — Op Note (Signed)
02/14/2022  1:01 PM  PATIENT:  Crystal Moses    PRE-OPERATIVE DIAGNOSIS:  Abscess Right Shoulder  POST-OPERATIVE DIAGNOSIS:  Same  PROCEDURE:  REPEAT DEBRIDEMENT RIGHT SHOULDER Application of customizable Prevena wound VAC. Installation of 1 g vancomycin powder. Penrose drain placed  SURGEON:  Nadara Mustard, MD  PHYSICIAN ASSISTANT:None ANESTHESIA:   General  PREOPERATIVE INDICATIONS:  [name] is a  66 y.o. female with a diagnosis of Abscess Right Shoulder who failed conservative measures and elected for surgical management.    The risks benefits and alternatives were discussed with the patient preoperatively including but not limited to the risks of infection, bleeding, nerve injury, cardiopulmonary complications, the need for revision surgery, among others, and the patient was willing to proceed.  OPERATIVE IMPLANTS: 1 g vancomycin powder and Penrose drain.  @ENCIMAGES @  OPERATIVE FINDINGS: Tissue margins were clear healthy and viable.  OPERATIVE PROCEDURE: Patient was brought the operating room and underwent a general anesthetic.  After adequate levels anesthesia were obtained patient's right upper extremity was prepped using DuraPrep draped into a sterile field a timeout was called.  The cleanse choice wound VAC sponge was removed.  The wound margins were healthy and viable.  A rondure and a 21 blade knife was used to further debride the wound with excision of the bone and soft tissue muscle and fascia.  Electrocautery was used for hemostasis.  There is no signs of recurrent purulence.  The wound was irrigated with normal saline.  Electrocautery was used hemostasis.  The glenoid area was filled with 1 g vancomycin powder.  A Penrose drain was placed deep through the wound the incision was closed using 2-0 nylon and a customizable Prevena wound VAC was applied this had a good suction fit patient was extubated taken the PACU in stable condition  Debridement type: Excisional  Debridement  Side: right  Body Location: shoulder   Tools used for debridement: scalpel and rongeur  Pre-debridement Wound size (cm):   Length: 20        Width: 5     Depth: 5   Post-debridement Wound size (cm):   Length: 20        Width: 5     Depth: 5   Debridement depth beyond dead/damaged tissue down to healthy viable tissue: yes  Tissue layer involved: skin, subcutaneous tissue, muscle / fascia, bone  Nature of tissue removed: Devitalized Tissue  Irrigation volume: 3 liters     Irrigation fluid type: Normal Saline   DISCHARGE PLANNING:  Antibiotic duration: Continue antibiotics as per culture sensitivities.  Patient will need prolonged antibiotic coverage.  Weightbearing: Not applicable sling for the right arm  Pain medication: Opioid pathway  Dressing care/ Wound VAC: Continue wound VAC for 1 week  Ambulatory devices: No use of right arm for ambulation  Discharge to: Anticipate discharge to skilled nursing facility.  No further debridement surgery planned at this time.

## 2022-02-14 NOTE — Anesthesia Procedure Notes (Signed)
Procedure Name: Intubation Date/Time: 02/14/2022 9:48 AM  Performed by: Inda Coke, CRNAPre-anesthesia Checklist: Patient identified, Emergency Drugs available, Suction available, Timeout performed and Patient being monitored Patient Re-evaluated:Patient Re-evaluated prior to induction Oxygen Delivery Method: Circle system utilized Preoxygenation: Pre-oxygenation with 100% oxygen Induction Type: IV induction Ventilation: Mask ventilation without difficulty Laryngoscope Size: Mac and 3 Grade View: Grade I Tube type: Oral Tube size: 7.5 mm Airway Equipment and Method: Stylet Placement Confirmation: ETT inserted through vocal cords under direct vision, positive ETCO2, CO2 detector and breath sounds checked- equal and bilateral Secured at: 22 cm Tube secured with: Tape Dental Injury: Teeth and Oropharynx as per pre-operative assessment

## 2022-02-14 NOTE — Progress Notes (Signed)
Orthopedic Tech Progress Note Patient Details:  SHREENA BAINES 09/08/1955 [id number]  Ortho Devices Type of Ortho Device: Shoulder immobilizer Ortho Device/Splint Location: RUE Ortho Device/Splint Interventions: Ordered, Application, Adjustment   Post Interventions Patient Tolerated: Poor Sling applied to my best ability, unable to achieve desired level of elevation due to patients pain.   Darleen Crocker 02/14/2022, 4:22 PM

## 2022-02-14 NOTE — Interval H&P Note (Signed)
History and Physical Interval Note:  02/14/2022 6:43 AM  Crystal Moses  has presented today for surgery, with the diagnosis of Abscess Right Shoulder.  The various methods of treatment have been discussed with the patient and family. After consideration of risks, benefits and other options for treatment, the patient has consented to  Procedure(s): REPEAT DEBRIDEMENT RIGHT SHOULDER (Right) as a surgical intervention.  The patient's history has been reviewed, patient examined, no change in status, stable for surgery.  I have reviewed the patient's chart and labs.  Questions were answered to the patient's satisfaction.     Nadara Mustard

## 2022-02-14 NOTE — Transfer of Care (Signed)
Immediate Anesthesia Transfer of Care Note  Patient: Crystal Moses  Procedure(s) Performed: REPEAT DEBRIDEMENT RIGHT SHOULDER (Right: Shoulder)  Patient Location: PACU  Anesthesia Type:General  Level of Consciousness: awake and drowsy  Airway & Oxygen Therapy: Patient Spontanous Breathing and Patient connected to face mask oxygen  Post-op Assessment: Report given to RN and Post -op Vital signs reviewed and stable  Post vital signs: Reviewed and stable  Last Vitals:  Vitals Value Taken Time  BP 143/72 02/14/22 1032  Temp 37 C 02/14/22 1031  Pulse 110 02/14/22 1036  Resp 14 02/14/22 1036  SpO2 96 % 02/14/22 1036  Vitals shown include unvalidated device data.  Last Pain:  Vitals:   02/14/22 1031  TempSrc:   PainSc: 6       Patients Stated Pain Goal: 0 (02/14/22 0240)  Complications: No notable events documented.

## 2022-02-14 NOTE — Progress Notes (Addendum)
Pt off unit, en route to pre-op for Right shoulder abscess. BG 129, VS stable, 3L [place], upper denture removed.

## 2022-02-15 ENCOUNTER — Encounter (HOSPITAL_COMMUNITY): Payer: Self-pay | Admitting: Orthopedic Surgery

## 2022-02-15 DIAGNOSIS — L02413 Cutaneous abscess of right upper limb: Secondary | ICD-10-CM | POA: Diagnosis not present

## 2022-02-15 DIAGNOSIS — I5032 Chronic diastolic (congestive) heart failure: Secondary | ICD-10-CM | POA: Diagnosis not present

## 2022-02-15 DIAGNOSIS — N179 Acute kidney failure, unspecified: Secondary | ICD-10-CM | POA: Diagnosis not present

## 2022-02-15 DIAGNOSIS — G9341 Metabolic encephalopathy: Secondary | ICD-10-CM | POA: Diagnosis not present

## 2022-02-15 LAB — BASIC METABOLIC PANEL
Anion gap: 11 (ref 5–15)
BUN: 55 mg/dL — ABNORMAL HIGH (ref 8–23)
CO2: 27 mmol/L (ref 22–32)
Calcium: 8.2 mg/dL — ABNORMAL LOW (ref 8.9–10.3)
Chloride: 103 mmol/L (ref 98–111)
Creatinine, Ser: 1.59 mg/dL — ABNORMAL HIGH (ref 0.44–1.00)
GFR, Estimated: 36 mL/min — ABNORMAL LOW (ref >60)
Glucose, Bld: 141 mg/dL — ABNORMAL HIGH (ref 70–99)
Potassium: 4.3 mmol/L (ref 3.5–5.1)
Sodium: 141 mmol/L (ref 135–145)

## 2022-02-15 LAB — GLUCOSE, CAPILLARY
Glucose-Capillary: 157 mg/dL — ABNORMAL HIGH (ref 70–99)
Glucose-Capillary: 144 mg/dL — ABNORMAL HIGH (ref 70–99)

## 2022-02-15 MED ORDER — ACETAMINOPHEN 325 MG PO TABS
650.0000 mg | ORAL_TABLET | Freq: Four times a day (QID) | ORAL | Status: DC
  Administered 2022-02-15 – 2022-02-21 (×22): 650 mg via ORAL
  Filled 2022-02-15 (×24): qty 2

## 2022-02-15 MED ORDER — TORSEMIDE 20 MG PO TABS
40.0000 mg | ORAL_TABLET | Freq: Every day | ORAL | Status: DC
  Administered 2022-02-15 – 2022-02-17 (×3): 40 mg via ORAL
  Filled 2022-02-15 (×3): qty 2

## 2022-02-15 MED ORDER — HYDROMORPHONE HCL 1 MG/ML IJ SOLN
1.0000 mg | INTRAMUSCULAR | Status: DC | PRN
  Administered 2022-02-15 – 2022-02-16 (×3): 1 mg via INTRAVENOUS
  Filled 2022-02-15 (×3): qty 1

## 2022-02-15 MED ORDER — OXYCODONE HCL 5 MG PO TABS
10.0000 mg | ORAL_TABLET | ORAL | Status: DC | PRN
  Administered 2022-02-15 – 2022-02-16 (×2): 10 mg via ORAL
  Filled 2022-02-15 (×2): qty 2

## 2022-02-15 MED ORDER — SODIUM CHLORIDE 0.9% FLUSH
10.0000 mL | Freq: Two times a day (BID) | INTRAVENOUS | Status: DC
  Administered 2022-02-15 – 2022-02-20 (×10): 10 mL
  Administered 2022-02-21: 20 mL
  Administered 2022-02-21: 10 mL

## 2022-02-15 MED ORDER — CHLORHEXIDINE GLUCONATE CLOTH 2 % EX PADS
6.0000 | MEDICATED_PAD | Freq: Every day | CUTANEOUS | Status: DC
  Administered 2022-02-15 – 2022-02-21 (×7): 6 via TOPICAL

## 2022-02-15 MED ORDER — SODIUM CHLORIDE 0.9% FLUSH: 10.0000 mL | INTRAVENOUS | Status: DC | PRN

## 2022-02-15 NOTE — Evaluation (Signed)
Physical Therapy Evaluation Patient Details Name: Crystal Moses MRN: [id number] DOB: 1955/11/16 Today's Date: 02/15/2022  History of Present Illness  66 y.o. female presents to [facility] hospital on 02/10/2022 with AMS, weakness and R shoulder pain. Pt admitted for management of sepsis with abscess of R shoulder. Pt underwent R shoulder debridement with resection of humeral head on 02/12/2022, repeat debridement on 6/30. PMH includes CAD, CHF, DM, HLD, HTN.  Clinical Impression  Pt presents to PT with deficits in functional mobility, balance, strength, power, cognition, and with significant pain. Pt requires totalA to perform functional mobility and requires max encouragement to utilize unaffected limbs during session, often reporting she is unable to use them. Pt is generally weak and will benefit from frequent mobilization in an effort to reduce falls risk and caregiver burden. PT will need to clarify PLOF as the pt is a poor historian, however PT recommends a return to SNF to aide in improving functional mobility quality.       Recommendations for follow up therapy are one component of a multi-disciplinary discharge planning process, led by the attending physician.  Recommendations may be updated based on patient status, additional functional criteria and insurance authorization.  Follow Up Recommendations Skilled nursing-short term rehab (<3 hours/day) Can patient physically be transported by private vehicle: No    Assistance Recommended at Discharge Intermittent Supervision/Assistance  Patient can return home with the following  Two people to help with walking and/or transfers;Two people to help with bathing/dressing/bathroom;Assistance with cooking/housework;Assistance with feeding;Direct supervision/assist for medications management;Direct supervision/assist for financial management;Assist for transportation;Help with stairs or ramp for entrance    Equipment Recommendations  (defer to post-acute  setting)  Recommendations for Other Services       Functional Status Assessment Patient has had a recent decline in their functional status and demonstrates the ability to make significant improvements in function in a reasonable and predictable amount of time.     Precautions / Restrictions Precautions Precautions: Fall Required Braces or Orthoses: Sling Restrictions Weight Bearing Restrictions: Yes RUE Weight Bearing: Non weight bearing Other Position/Activity Restrictions: no ROM restrictions to RUE per Dr. Lajoyce Corners, pt shoulder mobility will be limited due to humeral head resection      Mobility  Bed Mobility Overal bed mobility: Needs Assistance Bed Mobility: Supine to Sit, Sit to Supine     Supine to sit: Total assist, +2 for physical assistance Sit to supine: Total assist, +2 for physical assistance        Transfers                        Ambulation/Gait                  Stairs            Wheelchair Mobility    Modified Rankin (Stroke Patients Only)       Balance Overall balance assessment: Needs assistance Sitting-balance support: No upper extremity supported, Feet supported Sitting balance-Leahy Scale: Fair (modA initially progressing to fair balance with supervision)                                       Pertinent Vitals/Pain Pain Assessment Pain Assessment: 0-10 Pain Score: 9  Pain Location: ears, neck, back, RUE Pain Descriptors / Indicators: Aching Pain Intervention(s): Monitored during session    Home Living Family/patient expects to be discharged to:: Skilled  nursing facility [facility])                        Prior Function Prior Level of Function : Needs assist             Mobility Comments: pt is a poor historian, reports she was walking when she entered the [place] center ~1 year ago, today she reports she has not moved out of bed at all recently prior to this admission. PT will need  to further clarify PLOF       Hand Dominance   Dominant Hand: Right    Extremity/Trunk Assessment   Upper Extremity Assessment Upper Extremity Assessment: RUE deficits/detail;LUE deficits/detail RUE Deficits / Details: RUE in sling, pt demonstrates functional AROM of digits along with antigravity movement of elbow flexion, further assessment deferred due to pain LUE Deficits / Details: ROM WFL, grossly 3/5 with persistent encouragment from PT for use    Lower Extremity Assessment Lower Extremity Assessment: Generalized weakness    Cervical / Trunk Assessment Cervical / Trunk Assessment: Other exceptions Cervical / Trunk Exceptions: excess body habitus  Communication   Communication: No difficulties  Cognition Arousal/Alertness: Awake/alert Behavior During Therapy: Anxious (labile) Overall Cognitive Status: Impaired/Different from baseline Area of Impairment: Orientation, Attention, Memory, Following commands, Safety/judgement, Awareness, Problem solving                 Orientation Level: Disoriented to, Situation, Time Current Attention Level: Focused Memory: Decreased recall of precautions, Decreased short-term memory Following Commands: Follows one step commands with increased time Safety/Judgement: Decreased awareness of safety, Decreased awareness of deficits Awareness: Intellectual Problem Solving: Slow processing, Requires verbal cues General Comments: pt often reporting inability to utilize L side, requires max encouragement to participate in movement with LUE        General Comments General comments (skin integrity, edema, etc.): pt on 5L [place] upon PT arrival, weaned to room air with desaturation into high 80s. PT replaces pt on 3L [place] with imrpovement in sats to low 90s    Exercises     Assessment/Plan    PT Assessment Patient needs continued PT services  PT Problem List Decreased strength;Decreased activity tolerance;Decreased balance;Decreased range of  motion;Decreased mobility;Decreased cognition;Decreased knowledge of use of DME;Decreased safety awareness;Decreased knowledge of precautions;Cardiopulmonary status limiting activity;Pain       PT Treatment Interventions DME instruction;Functional mobility training;Therapeutic activities;Therapeutic exercise;Balance training;Neuromuscular re-education;Cognitive remediation;Patient/family education;Wheelchair mobility training    PT Goals (Current goals can be found in the Care Plan section)  Acute Rehab PT Goals Patient Stated Goal: to reduce pain PT Goal Formulation: With patient Time For Goal Achievement: 03/01/22 Potential to Achieve Goals: Fair    Frequency Min 2X/week     Co-evaluation               AM-PAC PT "6 Clicks" Mobility  Outcome Measure Help needed turning from your back to your side while in a flat bed without using bedrails?: Total Help needed moving from lying on your back to sitting on the side of a flat bed without using bedrails?: Total Help needed moving to and from a bed to a chair (including a wheelchair)?: Total Help needed standing up from a chair using your arms (e.g., wheelchair or bedside chair)?: Total Help needed to walk in hospital room?: Total Help needed climbing 3-5 steps with a railing? : Total 6 Click Score: 6    End of Session Equipment Utilized During Treatment: Oxygen Activity Tolerance: Patient  limited by pain (limited by anxiety) Patient left: in bed;with call bell/phone within reach;with bed alarm set Nurse Communication: Mobility status;Need for lift equipment PT Visit Diagnosis: Other abnormalities of gait and mobility (R26.89);Muscle weakness (generalized) (M62.81);Pain Pain - Right/Left: Right Pain - part of body: Shoulder    Time: 1026-1110 PT Time Calculation (min) (ACUTE ONLY): 44 min   Charges:   PT Evaluation $PT Eval Low Complexity: 1 Low PT Treatments $Therapeutic Activity: 8-22 mins        Arlyss Gandy, PT,  DPT Acute Rehabilitation Office [phone]   Arlyss Gandy 02/15/2022, 1:59 PM

## 2022-02-15 NOTE — Hospital Course (Signed)
Crystal Moses was admitted to the hospital with the working diagnosis of severe sepsis, in the setting of right shoulder abscess.   66 yo female with the past medical history of heart failure, hypertension, and lower extremity edema how presented with altered mental status, weakness and right shoulder pain. Patient not able to give detail history due to encephalopathy, positive right shoulder pain. Apparent local trauma about 1 month before admission. On her initial physical examination her blood pressure was 111 to 160 mmHg, temp 100.6, HR 99 to 100 and RR 17 to 27. Somnolent, Lungs with no wheezing or rales, heart with S1 and S2 present and rhythmic, with no gallops, abdomen not distended, positive lower extremity edema. Right shoulder with edema and erythema, tender to touch. Sp I&D in the ED.   Na 134, K 4,4 Cl 93, bicarbonate 29, glucose 150 bun 74 cr 2,68 Wbc 10,1 hgb 11,4 plt 142  Sars COVID 19 negative   Blood cultures positive for E coli 4/4   Chest radiograph with cardiomegaly with no infiltrates.   EKG 99 bpm, normal axis, right bundle branch block, sinus rhythm with no significant ST segment or T wave changes.   Wound culture positive for E coli.   06/28 and 06/30 I&D per orthopedics to right shoulder.   Echocardiogram with no signs of valvular vegetations.  Plan to continue antibiotic therapy per PICC line until 03/28/22.  ID has requested TEE before patient is discharged, cardiology has been informed possible TEE on 07/06 or  07/07

## 2022-02-15 NOTE — Progress Notes (Signed)
Patient ID: Crystal Moses, female   DOB: 1956/06/20, 66 y.o.   MRN: [id number] Patient is postop day 1 repeat debridement right shoulder.  Tissue margins were clear and the wound was closed.  There is 100 cc in the wound VAC canister.  Patient may use the right upper extremity as she feels comfortable.  Anticipate she will not be able to use it for a walker.  Patient continues to complain of ear pain and leg pain.

## 2022-02-15 NOTE — Progress Notes (Signed)
Peripherally Inserted Central Catheter Placement  The IV Nurse has discussed with the patient and/or persons authorized to consent for the patient, the purpose of this procedure and the potential benefits and risks involved with this procedure.  The benefits include less needle sticks, lab draws from the catheter, and the patient may be discharged home with the catheter. Risks include, but not limited to, infection, bleeding, blood clot (thrombus formation), and puncture of an artery; nerve damage and irregular heartbeat and possibility to perform a PICC exchange if needed/ordered by physician.  Alternatives to this procedure were also discussed.  Bard Power PICC patient education guide, fact sheet on infection prevention and patient information card has been provided to patient /or left at bedside.    PICC Placement Documentation  PICC Single Lumen 02/15/22 Left Basilic 49 cm 0 cm (Active)  Indication for Insertion or Continuance of Line Prolonged intravenous therapies 02/15/22 0927  Exposed Catheter (cm) 0 cm 02/15/22 2993  Site Assessment Clean, Dry, Intact 02/15/22 7169  Line Status Flushed;Blood return noted;Saline locked 02/15/22 0927  Dressing Type Transparent 02/15/22 0927  Dressing Status Antimicrobial disc in place 02/15/22 0927  Dressing Change Due 02/22/22 02/15/22 6789       Audrie Gallus 02/15/2022, 9:29 AM

## 2022-02-15 NOTE — Assessment & Plan Note (Addendum)
Septic arthritis, complicated with sepsis present on admission.  E Coli with bacteremia and bacteriuria (UTI)  Patient is sp I&D x2  Wbc was 11,5 yesterday, she has been afebrile. Follow up cultures from 06/28 continue with no growth.   Plan to continue antibiotic therapy with ceftriaxone for a total of 6 weeks,.  Pain is not well controlled today, will increase hydromorphone to 1 mg for sever pain and add oxycodone 10 mg po as needed for moderate pain.  Dc hydrocodone and morphine Change acetaminophen to schedule q 6 hrs.

## 2022-02-15 NOTE — Assessment & Plan Note (Signed)
Renal function with serum cr at 1,59 K is 4,3 and serum bicarbonate at 27 Continue close monitoring of renal function and electrolytes. Resume torsemide for now.

## 2022-02-15 NOTE — Progress Notes (Signed)
Progress Note   Patient: Crystal Moses TDV:[id number] DOB: [birth year] DOA: 02/10/2022     5 DOS: the patient was seen and examined on 02/15/2022   Brief hospital course: Mrs. Kilgour was admitted to the hospital with the working diagnosis of severe sepsis, in the setting of right shoulder abscess.   66 yo female with the past medical history of heart failure, hypertension, and lower extremity edema how presented with altered mental status, weakness and right shoulder pain. Patient not able to give detail history due to encephalopathy, positive right shoulder pain. Apparent local trauma about 1 month before admission. On her initial physical examination her blood pressure was 111 to 160 mmHg, temp 100.6, HR 99 to 100 and RR 17 to 27. Somnolent, Lungs with no wheezing or rales, heart with S1 and S2 present and rhythmic, with no gallops, abdomen not distended, positive lower extremity edema. Right shoulder with edema and erythema, tender to touch. Sp I&D in the ED.   Na 134, K 4,4 Cl 93, bicarbonate 29, glucose 150 bun 74 cr 2,68 Wbc 10,1 hgb 11,4 plt 142  Sars COVID 19 negative   Blood cultures positive for E coli 4/4   Chest radiograph with cardiomegaly with no infiltrates.   EKG 99 bpm, normal axis, right bundle branch block, sinus rhythm with no significant ST segment or T wave changes.   Wound culture positive for E coli.   06/28 and 06/30 I&D per orthopedics to right shoulder.   Assessment and Plan: Abscess of right shoulder Septic arthritis, complicated with sepsis present on admission.  E Coli with bacteremia and bacteriuria (UTI)  Patient is sp I&D x2  Wbc was 11,5 yesterday, she has been afebrile. Follow up cultures from 06/28 continue with no growth.   Plan to continue antibiotic therapy with ceftriaxone for a total of 6 weeks,.  Pain is not well controlled today, will increase hydromorphone to 1 mg for sever pain and add oxycodone 10 mg po as needed for moderate pain.  Dc  hydrocodone and morphine Change acetaminophen to schedule q 6 hrs.     Acute metabolic encephalopathy -Altered mental status likely secondary to severe sepsis.  -Head CT without acute abnormality.  -EEG demonstrating encephalopathy without acute or specific abnormalities or signs of epileptic waves.    Her mentation continue to improve.  Today with significant pain.,   AKI (acute kidney injury) (HCC) Renal function with serum cr at 1,59 K is 4,3 and serum bicarbonate at 27 Continue close monitoring of renal function and electrolytes. Resume torsemide for now.   CHF (congestive heart failure) (HCC) Patient with positive lower extremity edema. Hold on IV fluids, and will resume oral diuretic therapy with torsemide 40 mg daily.   DM (diabetes mellitus) (HCC) -continue modified carb diet -follow CBG's fluctuation -resume diet control as an outpatient. -Hemoglobin A1C of 5.3% from 10/2020.   HTN (hypertension) -Continue Labetalol PRN -BP stable        Subjective: Patient with significant pain at her right shoulder today, no nausea or vomiting,   Physical Exam: Vitals:   02/15/22 0000 02/15/22 0400 02/15/22 0619 02/15/22 0833  BP: 134/67 126/83  116/83  Pulse: 84 86  84  Resp: 20 14    Temp: 98.3 F (36.8 C) 98.4 F (36.9 C)  98.3 F (36.8 C)  TempSrc: Oral Oral  Oral  SpO2: 97% 95%  94%  Weight:   118.9 kg   Height:       Neurology awake and  alert ENT with no pallor Cardiovascular with S1 and S2 present and rhythmic Respiratory with no rales or wheezing, on anterior auscultation  Abdomen not distended Positive lower extremity edema +++ pitting  Right shoulder with wound vac in place,.  Data Reviewed:    Family Communication: I spoke over the phone with the patient's son about patient's  condition, plan of care, prognosis and all questions were addressed.   Disposition: Status is: Inpatient Remains inpatient appropriate because: IV antibiotic therapy    Planned Discharge Destination: Skilled nursing facility      Author: Coralie Keens, MD 02/15/2022 3:50 PM  For on call review [email or web address].

## 2022-02-16 ENCOUNTER — Inpatient Hospital Stay (HOSPITAL_COMMUNITY): Payer: Medicare HMO

## 2022-02-16 DIAGNOSIS — G9341 Metabolic encephalopathy: Secondary | ICD-10-CM | POA: Diagnosis not present

## 2022-02-16 DIAGNOSIS — N179 Acute kidney failure, unspecified: Secondary | ICD-10-CM | POA: Diagnosis not present

## 2022-02-16 DIAGNOSIS — R7881 Bacteremia: Secondary | ICD-10-CM | POA: Diagnosis not present

## 2022-02-16 DIAGNOSIS — I5032 Chronic diastolic (congestive) heart failure: Secondary | ICD-10-CM | POA: Diagnosis not present

## 2022-02-16 DIAGNOSIS — L02413 Cutaneous abscess of right upper limb: Secondary | ICD-10-CM | POA: Diagnosis not present

## 2022-02-16 LAB — BASIC METABOLIC PANEL
Anion gap: 15 (ref 5–15)
BUN: 45 mg/dL — ABNORMAL HIGH (ref 8–23)
CO2: 29 mmol/L (ref 22–32)
Calcium: 8.3 mg/dL — ABNORMAL LOW (ref 8.9–10.3)
Chloride: 97 mmol/L — ABNORMAL LOW (ref 98–111)
Creatinine, Ser: 1.65 mg/dL — ABNORMAL HIGH (ref 0.44–1.00)
GFR, Estimated: 34 mL/min — ABNORMAL LOW (ref >60)
Glucose, Bld: 123 mg/dL — ABNORMAL HIGH (ref 70–99)
Potassium: 3.9 mmol/L (ref 3.5–5.1)
Sodium: 141 mmol/L (ref 135–145)

## 2022-02-16 LAB — ECHOCARDIOGRAM COMPLETE
AR max vel: 1.49 cm2
AV Area VTI: 1.73 cm2
AV Area mean vel: 1.47 cm2
AV Mean grad: 14 mmHg
AV Peak grad: 28.8 mmHg
Ao pk vel: 2.69 m/s
Area-P 1/2: 5.38 cm2
Height: 61 in
S' Lateral: 2.9 cm
Weight: 4105.85 oz

## 2022-02-16 LAB — GLUCOSE, CAPILLARY
Glucose-Capillary: 120 mg/dL — ABNORMAL HIGH (ref 70–99)
Glucose-Capillary: 122 mg/dL — ABNORMAL HIGH (ref 70–99)
Glucose-Capillary: 135 mg/dL — ABNORMAL HIGH (ref 70–99)
Glucose-Capillary: 138 mg/dL — ABNORMAL HIGH (ref 70–99)

## 2022-02-16 LAB — CBC
HCT: 27.6 % — ABNORMAL LOW (ref 36.0–46.0)
Hemoglobin: 8.2 g/dL — ABNORMAL LOW (ref 12.0–15.0)
MCH: 29.7 pg (ref 26.0–34.0)
MCHC: 29.7 g/dL — ABNORMAL LOW (ref 30.0–36.0)
MCV: 100 fL (ref 80.0–100.0)
Platelets: 300 10*3/uL (ref 150–400)
RBC: 2.76 MIL/uL — ABNORMAL LOW (ref 3.87–5.11)
RDW: 14.9 % (ref 11.5–15.5)
WBC: 15.2 10*3/uL — ABNORMAL HIGH (ref 4.0–10.5)
nRBC: 0.3 % — ABNORMAL HIGH (ref 0.0–0.2)

## 2022-02-16 MED ORDER — CELECOXIB 200 MG PO CAPS
200.0000 mg | ORAL_CAPSULE | Freq: Two times a day (BID) | ORAL | Status: DC
Start: 2022-02-16 — End: 2022-02-18
  Administered 2022-02-16 – 2022-02-18 (×5): 200 mg via ORAL
  Filled 2022-02-16 (×6): qty 1

## 2022-02-16 MED ORDER — OXYCODONE HCL 5 MG PO TABS
5.0000 mg | ORAL_TABLET | ORAL | Status: DC | PRN
  Administered 2022-02-16 – 2022-02-21 (×14): 5 mg via ORAL
  Filled 2022-02-16 (×14): qty 1

## 2022-02-16 MED ORDER — HYDROMORPHONE HCL 1 MG/ML IJ SOLN
0.5000 mg | INTRAMUSCULAR | Status: DC | PRN
  Administered 2022-02-16 – 2022-02-21 (×12): 0.5 mg via INTRAVENOUS
  Filled 2022-02-16 (×12): qty 0.5

## 2022-02-16 NOTE — Progress Notes (Signed)
Progress Note   Patient: Crystal Moses MWU:[id number] DOB: 1955-09-14 DOA: 02/10/2022     6 DOS: the patient was seen and examined on 02/16/2022   Brief hospital course: Crystal Moses was admitted to the hospital with the working diagnosis of severe sepsis, in the setting of right shoulder abscess.   66 yo female with the past medical history of heart failure, hypertension, and lower extremity edema how presented with altered mental status, weakness and right shoulder pain. Patient not able to give detail history due to encephalopathy, positive right shoulder pain. Apparent local trauma about 1 month before admission. On her initial physical examination her blood pressure was 111 to 160 mmHg, temp 100.6, HR 99 to 100 and RR 17 to 27. Somnolent, Lungs with no wheezing or rales, heart with S1 and S2 present and rhythmic, with no gallops, abdomen not distended, positive lower extremity edema. Right shoulder with edema and erythema, tender to touch. Sp I&D in the ED.   Na 134, K 4,4 Cl 93, bicarbonate 29, glucose 150 bun 74 cr 2,68 Wbc 10,1 hgb 11,4 plt 142  Sars COVID 19 negative   Blood cultures positive for E coli 4/4   Chest radiograph with cardiomegaly with no infiltrates.   EKG 99 bpm, normal axis, right bundle branch block, sinus rhythm with no significant ST segment or T wave changes.   Wound culture positive for E coli.   06/28 and 06/30 I&D per orthopedics to right shoulder.   Assessment and Plan: Abscess of right shoulder Septic arthritis, complicated with sepsis present on admission.  E Coli with bacteremia and bacteriuria (UTI)  Patient is sp I&D x2  Wbc is 15,2  Blood cultures 06/28 with no growth.    Plan to continue antibiotic therapy with ceftriaxone for a total of 6 weeks,.  Today her pain is better controlled, plan to continue with scheduled acetaminophen and celebrex. Continue with as needed oxycodone and hydromorphone.  Plan to reduce dose of opiates to prevent  encephalopathy.     Acute metabolic encephalopathy Multifactorial encephalopathy.  EEG negative for seizures, clinically improved.  Today her shoulder pain is improved, but she looks somnolent at the time of my examination.   Plan to continue pain control with acetaminophen and will celebrex Will reduce dose of hydromorphone and oxycodone.  Continue to support nutrition.   Depression and anxiety. Continue with escitalopram and as needed alprazolam.  AKI (acute kidney injury) (HCC) Torsemide has been resumed with improvement in volume status,. Renal function with serum cr at 1,65 with K at 3,9 and serum bicarbonate at 29.   Plan to hold on torsemide for now and follow up on renal function and electrolytes in am.   CHF (congestive heart failure) (HCC) Edema has improved significantly, plan to hold on torsemide for today and continue close monitoring of volume status.   Hold on IV fluids.   DM (diabetes mellitus) (HCC) Glucose has been controlled, with fasting glucose this am at 123.  Continue with insulin sliding scalae for glucose cover and monitoring.    HTN (hypertension) Blood pressure has been stable, plan to continue close monitoring.         Subjective: Patient with improved pain at her right shoulder. She has mild somnolence but able to open her eyes and respond to questions.   Physical Exam: Vitals:   02/15/22 0833 02/15/22 1900 02/16/22 0031 02/16/22 0500  BP: 116/83 112/69 107/68   Pulse: 84 84 92   Resp:  20 20  Temp: 98.3 F (36.8 C) (!) 97.5 F (36.4 C) 98.6 F (37 C)   TempSrc: Oral Oral Oral   SpO2: 94% 98% 93%   Weight:   115.9 kg 116.4 kg  Height:       Neurology somnolent but easy to arouse, responds to questions and follows commands ENT With no pallor Cardiovascular with S1 and S2 present and rhythmic with no gallops or murmurs Respiratory with no wheezing Abdomen not distended No lower extremity edema Right shoulder with wound vac in  place  Data Reviewed:    Family Communication: no family at the bedside   Disposition: Status is: Inpatient Remains inpatient appropriate because: septic arthritis   Planned Discharge Destination: SNF   Author: Coralie Keens, MD 02/16/2022 1:56 PM  For on call review [email or web address].

## 2022-02-16 NOTE — Progress Notes (Signed)
  Echocardiogram 2D Echocardiogram has been performed.  Delcie Roch 02/16/2022, 5:32 PM

## 2022-02-17 DIAGNOSIS — G9341 Metabolic encephalopathy: Secondary | ICD-10-CM | POA: Diagnosis not present

## 2022-02-17 DIAGNOSIS — N179 Acute kidney failure, unspecified: Secondary | ICD-10-CM | POA: Diagnosis not present

## 2022-02-17 DIAGNOSIS — R7881 Bacteremia: Secondary | ICD-10-CM | POA: Diagnosis not present

## 2022-02-17 DIAGNOSIS — I5032 Chronic diastolic (congestive) heart failure: Secondary | ICD-10-CM | POA: Diagnosis not present

## 2022-02-17 DIAGNOSIS — D72829 Elevated white blood cell count, unspecified: Secondary | ICD-10-CM

## 2022-02-17 DIAGNOSIS — M86111 Other acute osteomyelitis, right shoulder: Secondary | ICD-10-CM | POA: Diagnosis not present

## 2022-02-17 DIAGNOSIS — L02413 Cutaneous abscess of right upper limb: Secondary | ICD-10-CM | POA: Diagnosis not present

## 2022-02-17 DIAGNOSIS — B9620 Unspecified Escherichia coli [E. coli] as the cause of diseases classified elsewhere: Secondary | ICD-10-CM | POA: Diagnosis not present

## 2022-02-17 LAB — CBC
HCT: 27 % — ABNORMAL LOW (ref 36.0–46.0)
Hemoglobin: 8.5 g/dL — ABNORMAL LOW (ref 12.0–15.0)
MCH: 30.6 pg (ref 26.0–34.0)
MCHC: 31.5 g/dL (ref 30.0–36.0)
MCV: 97.1 fL (ref 80.0–100.0)
Platelets: 334 10*3/uL (ref 150–400)
RBC: 2.78 MIL/uL — ABNORMAL LOW (ref 3.87–5.11)
RDW: 15 % (ref 11.5–15.5)
WBC: 17.4 10*3/uL — ABNORMAL HIGH (ref 4.0–10.5)
nRBC: 0.1 % (ref 0.0–0.2)

## 2022-02-17 LAB — CBC WITH DIFFERENTIAL/PLATELET
Abs Immature Granulocytes: 0.66 10*3/uL — ABNORMAL HIGH (ref 0.00–0.07)
Basophils Absolute: 0.1 10*3/uL (ref 0.0–0.1)
Basophils Relative: 0 %
Eosinophils Absolute: 0.2 10*3/uL (ref 0.0–0.5)
Eosinophils Relative: 1 %
HCT: 27 % — ABNORMAL LOW (ref 36.0–46.0)
Hemoglobin: 8.4 g/dL — ABNORMAL LOW (ref 12.0–15.0)
Immature Granulocytes: 3 %
Lymphocytes Relative: 11 %
Lymphs Abs: 2.2 10*3/uL (ref 0.7–4.0)
MCH: 30.1 pg (ref 26.0–34.0)
MCHC: 31.1 g/dL (ref 30.0–36.0)
MCV: 96.8 fL (ref 80.0–100.0)
Monocytes Absolute: 0.8 10*3/uL (ref 0.1–1.0)
Monocytes Relative: 4 %
Neutro Abs: 15.3 10*3/uL — ABNORMAL HIGH (ref 1.7–7.7)
Neutrophils Relative %: 81 %
Platelets: 354 10*3/uL (ref 150–400)
RBC: 2.79 MIL/uL — ABNORMAL LOW (ref 3.87–5.11)
RDW: 14.8 % (ref 11.5–15.5)
WBC: 19.2 10*3/uL — ABNORMAL HIGH (ref 4.0–10.5)
nRBC: 0 % (ref 0.0–0.2)

## 2022-02-17 LAB — AEROBIC/ANAEROBIC CULTURE W GRAM STAIN (SURGICAL/DEEP WOUND)

## 2022-02-17 LAB — CULTURE, BLOOD (ROUTINE X 2)
Culture: NO GROWTH
Culture: NO GROWTH
Special Requests: ADEQUATE
Special Requests: ADEQUATE

## 2022-02-17 LAB — BASIC METABOLIC PANEL
Anion gap: 14 (ref 5–15)
BUN: 38 mg/dL — ABNORMAL HIGH (ref 8–23)
CO2: 30 mmol/L (ref 22–32)
Calcium: 8 mg/dL — ABNORMAL LOW (ref 8.9–10.3)
Chloride: 95 mmol/L — ABNORMAL LOW (ref 98–111)
Creatinine, Ser: 1.56 mg/dL — ABNORMAL HIGH (ref 0.44–1.00)
GFR, Estimated: 36 mL/min — ABNORMAL LOW (ref >60)
Glucose, Bld: 128 mg/dL — ABNORMAL HIGH (ref 70–99)
Potassium: 3.3 mmol/L — ABNORMAL LOW (ref 3.5–5.1)
Sodium: 139 mmol/L (ref 135–145)

## 2022-02-17 LAB — GLUCOSE, CAPILLARY
Glucose-Capillary: 120 mg/dL — ABNORMAL HIGH (ref 70–99)
Glucose-Capillary: 126 mg/dL — ABNORMAL HIGH (ref 70–99)
Glucose-Capillary: 118 mg/dL — ABNORMAL HIGH (ref 70–99)

## 2022-02-17 MED ORDER — ORAL CARE MOUTH RINSE: 15.0000 mL | OROMUCOSAL | Status: DC | PRN

## 2022-02-17 MED ORDER — POTASSIUM CHLORIDE CRYS ER 20 MEQ PO TBCR
40.0000 meq | EXTENDED_RELEASE_TABLET | ORAL | Status: AC
  Administered 2022-02-17 (×2): 40 meq via ORAL
  Filled 2022-02-17 (×2): qty 2

## 2022-02-17 NOTE — Progress Notes (Signed)
[facility]  Date of Admission:  02/10/2022   Total days of inpatient antibiotics 6/27-p  Active Problems:   AKI (acute kidney injury) (HCC)   Abscess of right shoulder   DM (diabetes mellitus) (HCC)   CHF (congestive heart failure) (HCC)   HTN (hypertension)   Acute metabolic encephalopathy   Class 3 obesity (HCC)          Assessment: 67 YF with moderate dementia, nursing home resident admitted with right should infection.  #Ecoli bacteremia with Right shoulder OM, abscess/myonecrosis, septic glenohumeral joint SP I&D with Cx+ Ecoli #Leukocytosis #Hx of moderate dementia -TTE showed aortic valve thickening. Given pt has had symptoms for about a month would get TEE. -Her wbc is trending up. She has had I&D on 6/28 and 6/30 . -She is having trouble holding a conversation this morning. Discussed TEE. She stated she would like to go home.   Recommendations: -TEE scheduled on 7/7 pending conversation with son.  -Continue ceftriaxone -Monitor clinically  Microbiology:   Antibiotics: Cefepime 6/26 Ceftriaxone 6/27-p Cefazolin 6/28 Metronidazole 6/26 Vancomycin 6/26, 6/28, 6/30 Cultures: Blood 2/2 Ecoli 6/26 6/28 NG  Urine 6/29 >100000 Ecoli Other 6/26  and 6/28 eight hsoulder abscess Cx+ Ecoli  SUBJECTIVE: Resting in bed. Reports she would like to go home.   Review of Systems: Review of Systems  All other systems reviewed and are negative.    Scheduled Meds:  acetaminophen  650 mg Oral Q6H   ALPRAZolam  0.5 mg Oral TID   celecoxib  200 mg Oral BID   Chlorhexidine Gluconate Cloth  6 each Topical Daily   docusate sodium  100 mg Oral BID   escitalopram  20 mg Oral Daily   folic acid  1 mg Oral Daily   heparin  5,000 Units Subcutaneous Q8H   potassium chloride  40 mEq Oral Q4H   sodium chloride flush  10-40 mL Intracatheter Q12H   Continuous Infusions:  sodium chloride 10 mL/hr at 02/14/22 1800   cefTRIAXone (ROCEPHIN)   IV 2 g (02/16/22 1617)   methocarbamol (ROBAXIN) IV     PRN Meds:.bisacodyl, HYDROmorphone (DILAUDID) injection, labetalol, lip balm, methocarbamol **OR** methocarbamol (ROBAXIN) IV, metoCLOPramide **OR** metoCLOPramide (REGLAN) injection, ondansetron **OR** ondansetron (ZOFRAN) IV, mouth rinse, oxyCODONE, polyethylene glycol, sodium chloride flush No Known Allergies  OBJECTIVE: Vitals:   02/16/22 1933 02/17/22 0000 02/17/22 0400 02/17/22 0407  BP: (!) 110/57 107/65 115/86   Pulse: 94 87 94   Resp: 13 15 13    Temp: 98.6 F (37 C) 98.4 F (36.9 C) 98.5 F (36.9 C)   TempSrc: Oral Oral Oral   SpO2: 98% 96% 98%   Weight:    116.1 kg  Height:       Body mass index is 48.36 kg/m.  Physical Exam Constitutional:      Appearance: Normal appearance.  HENT:     Head: Normocephalic and atraumatic.     Right Ear: Tympanic membrane normal.     Left Ear: Tympanic membrane normal.     Nose: Nose normal.     Mouth/Throat:     Mouth: Mucous membranes are moist.  Eyes:     Extraocular Movements: Extraocular movements intact.     Conjunctiva/sclera: Conjunctivae normal.     Pupils: Pupils are equal, round, and reactive to light.  Cardiovascular:     Rate and Rhythm: Normal rate and regular rhythm.     Heart sounds: No murmur heard.  No friction rub. No gallop.  Pulmonary:     Effort: Pulmonary effort is normal.     Breath sounds: Normal breath sounds.  Abdominal:     General: Abdomen is flat.     Palpations: Abdomen is soft.  Musculoskeletal:     Comments: Right shoulder pain  wound vac  Skin:    General: Skin is warm and dry.  Neurological:     General: No focal deficit present.     Mental Status: She is alert and oriented to person, place, and time.  Psychiatric:        Mood and Affect: Mood normal.       Lab Results Lab Results  Component Value Date   WBC 17.4 (H) 02/17/2022   HGB 8.5 (L) 02/17/2022   HCT 27.0 (L) 02/17/2022   MCV 97.1 02/17/2022   PLT 334  02/17/2022    Lab Results  Component Value Date   CREATININE 1.56 (H) 02/17/2022   BUN 38 (H) 02/17/2022   NA 139 02/17/2022   K 3.3 (L) 02/17/2022   CL 95 (L) 02/17/2022   CO2 30 02/17/2022    Lab Results  Component Value Date   ALT 18 02/10/2022   AST 31 02/10/2022   ALKPHOS 76 02/10/2022   BILITOT 0.6 02/10/2022        Danelle Earthly, MD [facility] 02/17/2022, 2:07 PM

## 2022-02-17 NOTE — Care Management Important Message (Signed)
Important Message  Patient Details  Name: Crystal Moses MRN: [id number] Date of Birth: 04-18-1956   Medicare Important Message Given:  Yes     Renie Ora 02/17/2022, 9:30 AM

## 2022-02-17 NOTE — Progress Notes (Addendum)
Progress Note   Patient: Crystal Moses [id number] DOB: 1956/01/11 DOA: 02/10/2022     7 DOS: the patient was seen and examined on 02/17/2022   Brief hospital course: Mrs. Olveda was admitted to the hospital with the working diagnosis of severe sepsis, in the setting of right shoulder abscess.   66 yo female with the past medical history of heart failure, hypertension, and lower extremity edema how presented with altered mental status, weakness and right shoulder pain. Patient not able to give detail history due to encephalopathy, positive right shoulder pain. Apparent local trauma about 1 month before admission. On her initial physical examination her blood pressure was 111 to 160 mmHg, temp 100.6, HR 99 to 100 and RR 17 to 27. Somnolent, Lungs with no wheezing or rales, heart with S1 and S2 present and rhythmic, with no gallops, abdomen not distended, positive lower extremity edema. Right shoulder with edema and erythema, tender to touch. Sp I&D in the ED.   Na 134, K 4,4 Cl 93, bicarbonate 29, glucose 150 bun 74 cr 2,68 Wbc 10,1 hgb 11,4 plt 142  Sars COVID 19 negative   Blood cultures positive for E coli 4/4   Chest radiograph with cardiomegaly with no infiltrates.   EKG 99 bpm, normal axis, right bundle branch block, sinus rhythm with no significant ST segment or T wave changes.   Wound culture positive for E coli.   06/28 and 06/30 I&D per orthopedics to right shoulder.   Echocardiogram with no signs of valvular vegetations.  Plan to continue antibiotic therapy per PICC line until 03/28/22 and follow up with ID as outpatient.  Patient is medically stable to be transfer to SNF.   Assessment and Plan: Abscess of right shoulder Septic arthritis, complicated with sepsis present on admission.  E Coli with bacteremia and bacteriuria (UTI)  Patient is sp I&D x2  Wbc is 17.4 (reactive leukocytosis)  Blood cultures 06/28 continue with no growth.     Plan to continue antibiotic  therapy with ceftriaxone for a total of 6 weeks, complete on 03/28/22.  I spoke with Dr Thedore Mins from ID and recommendations for patient to have TEE. Contacted Cardiology for transesophageal echo (tentative for Friday)  Continue pain control with acetaminophen, celebrex, oxycodone and for severe pain hydromorphone.   Today she complains more of leg pain that shoulder pain.  She has a wound VAC, per ortho will need to be continued for 7 days after last surgery, that will be until 02/21/22.     Acute metabolic encephalopathy Multifactorial encephalopathy.  EEG negative for seizures, clinically improved.  Today her shoulder pain is improved, but she looks somnolent at the time of my examination.   Continue pain control and nutritional support  Depression and anxiety. Continue with escitalopram and as needed alprazolam.  AKI (acute kidney injury) (HCC) Hypokalemia.   Volume status has improved after one dose of torsemide.  Improved renal function with serum cr at 1,56, K is 3,3 and serum bicarbonate at 30.   Plan to add Kcl 40 meq x2 for K correction and plan to continue diuretic therapy only as needed.   CHF (congestive heart failure) (HCC) Clinically euvolemic. Continue with diuretic therapy only as needed.   DM (diabetes mellitus) (HCC) Glucose has been controlled, with fasting glucose this am at 128.  Continue with insulin sliding scalae for glucose cover and monitoring.    HTN (hypertension) Blood pressure has been stable, plan to continue close monitoring.   Class 3 obesity (  HCC) Calculated BMI 48.3         Subjective: Patient with pain at her lower extremities, no nausea or vomiting, right shoulder pain is better controlled.   Physical Exam: Vitals:   02/16/22 1933 02/17/22 0000 02/17/22 0400 02/17/22 0407  BP: (!) 110/57 107/65 115/86   Pulse: 94 87 94   Resp: 13 15 13    Temp: 98.6 F (37 C) 98.4 F (36.9 C) 98.5 F (36.9 C)   TempSrc: Oral Oral Oral    SpO2: 98% 96% 98%   Weight:    116.1 kg  Height:       Neurology awake and alert ENT with no pallor or icterus Cardiovascular with S1 and S2 present and rhythmic with no gallops, rubs or murmurs Respiratory with no rales Abdomen protuberant but not distended No lower extremity edema Right shoulder with wound vac in place.  Data Reviewed:    Family Communication: no family at the bedside   Disposition: Status is: Inpatient Remains inpatient appropriate because: IV antibiotics pending transfer to SNF   Planned Discharge Destination: Skilled nursing facility     Author: , MD 02/17/2022 1:47 PM  For on call review [email or web address].

## 2022-02-17 NOTE — Assessment & Plan Note (Signed)
Calculated BMI 48.3

## 2022-02-18 DIAGNOSIS — I5032 Chronic diastolic (congestive) heart failure: Secondary | ICD-10-CM | POA: Diagnosis not present

## 2022-02-18 DIAGNOSIS — G9341 Metabolic encephalopathy: Secondary | ICD-10-CM | POA: Diagnosis not present

## 2022-02-18 DIAGNOSIS — L02413 Cutaneous abscess of right upper limb: Secondary | ICD-10-CM | POA: Diagnosis not present

## 2022-02-18 DIAGNOSIS — N179 Acute kidney failure, unspecified: Secondary | ICD-10-CM | POA: Diagnosis not present

## 2022-02-18 LAB — BASIC METABOLIC PANEL
Anion gap: 17 — ABNORMAL HIGH (ref 5–15)
BUN: 35 mg/dL — ABNORMAL HIGH (ref 8–23)
CO2: 28 mmol/L (ref 22–32)
Calcium: 8.2 mg/dL — ABNORMAL LOW (ref 8.9–10.3)
Chloride: 92 mmol/L — ABNORMAL LOW (ref 98–111)
Creatinine, Ser: 1.62 mg/dL — ABNORMAL HIGH (ref 0.44–1.00)
GFR, Estimated: 35 mL/min — ABNORMAL LOW (ref >60)
Glucose, Bld: 121 mg/dL — ABNORMAL HIGH (ref 70–99)
Potassium: 3.7 mmol/L (ref 3.5–5.1)
Sodium: 137 mmol/L (ref 135–145)

## 2022-02-18 LAB — GLUCOSE, CAPILLARY
Glucose-Capillary: 107 mg/dL — ABNORMAL HIGH (ref 70–99)
Glucose-Capillary: 115 mg/dL — ABNORMAL HIGH (ref 70–99)
Glucose-Capillary: 130 mg/dL — ABNORMAL HIGH (ref 70–99)

## 2022-02-18 MED ORDER — LIDOCAINE 5 % EX PTCH
1.0000 | MEDICATED_PATCH | CUTANEOUS | Status: DC
  Administered 2022-02-18 – 2022-02-20 (×3): 1 via TRANSDERMAL
  Filled 2022-02-18 (×3): qty 1

## 2022-02-18 NOTE — Progress Notes (Addendum)
Progress Note   Patient: Crystal Moses [id number] DOB: [birth year] DOA: 02/10/2022     8 DOS: the patient was seen and examined on 02/18/2022   Brief hospital course: Crystal Moses was admitted to the hospital with the working diagnosis of severe sepsis, in the setting of right shoulder abscess.   66 yo female with the past medical history of heart failure, hypertension, and lower extremity edema how presented with altered mental status, weakness and right shoulder pain. Patient not able to give detail history due to encephalopathy, positive right shoulder pain. Apparent local trauma about 1 month before admission. On her initial physical examination her blood pressure was 111 to 160 mmHg, temp 100.6, HR 99 to 100 and RR 17 to 27. Somnolent, Lungs with no wheezing or rales, heart with S1 and S2 present and rhythmic, with no gallops, abdomen not distended, positive lower extremity edema. Right shoulder with edema and erythema, tender to touch. Sp I&D in the ED.   Na 134, K 4,4 Cl 93, bicarbonate 29, glucose 150 bun 74 cr 2,68 Wbc 10,1 hgb 11,4 plt 142  Sars COVID 19 negative   Blood cultures positive for E coli 4/4   Chest radiograph with cardiomegaly with no infiltrates.   EKG 99 bpm, normal axis, right bundle branch block, sinus rhythm with no significant ST segment or T wave changes.   Wound culture positive for E coli.   06/28 and 06/30 I&D per orthopedics to right shoulder.   Echocardiogram with no signs of valvular vegetations.  Plan to continue antibiotic therapy per PICC line until 03/28/22.  ID has requested TEE before patient is discharged, cardiology has been informed possible TEE on 07/06 or  07/07    Assessment and Plan: Abscess of right shoulder Septic arthritis, complicated with sepsis present on admission.  E Coli with bacteremia and bacteriuria (UTI)  Patient is sp I&D x2  Wbc is 19.2 (possible reactive leukocytosis)  Blood cultures 06/28 continue with no growth.      Plan to continue antibiotic therapy with ceftriaxone for a total of 6 weeks, complete on 03/28/22.  I spoke with Dr Thedore Mins from ID and recommendations for patient to have TEE. Contacted Cardiology for transesophageal echo (tentative for Friday)  Continue pain control with acetaminophen, oxycodone and for severe pain hydromorphone.   She has a wound VAC, per ortho will need to be continued for 7 days after last surgery, that will be until 02/21/22.     Acute metabolic encephalopathy Multifactorial encephalopathy.  EEG negative for seizures, clinically improved.   Continue pain control and nutritional support  Depression and anxiety. Continue with escitalopram and as needed alprazolam.  AKI (acute kidney injury) (HCC) Hypokalemia.   Renal function with serum cr at 1,62 with K at 3,7 and serum bicarbonate at 28.   Continue to hold on diuretic therapy, patient with poor oral intake. Follow up renal function in am, avoid hypotension and nephrotoxic medications.  Discontinue celebrex  CHF (congestive heart failure) (HCC) Clinically euvolemic. Continue with diuretic therapy only as needed.   DM (diabetes mellitus) (HCC) Glucose has been controlled, with fasting glucose this am at 121 mg/dl.  Continue with insulin sliding scalae for glucose cover and monitoring.    HTN (hypertension) Blood pressure has been stable, plan to continue close monitoring.   Class 3 obesity (HCC) Calculated BMI 48.3         Subjective: Patient today with back pain, shoulder and ear pain have improved, no chest pain or  dyspnea, today with no leg pain, She wants to go  home but is in agreement in getting TEE before her discharge   Physical Exam: Vitals:   02/18/22 0400 02/18/22 0500 02/18/22 0600 02/18/22 0845  BP:    (!) 116/56  Pulse: 90 89 91 96  Resp: 15 17 (!) 21 17  Temp:    98.3 F (36.8 C)  TempSrc:    Oral  SpO2: 95% 98% 96% 97%  Weight: 116 kg     Height:       Neurology  awake and alert ENT With mild pallor Cardiovascular with S1 and S2 present and rhythmic Respiratory with no rales or wheezing Abdomen not distended No lower extremity edema Right shoulder with wound vac in place  Data Reviewed:    Family Communication: no family at the bedside   Disposition: Status is: Inpatient Remains inpatient appropriate because: pending TEE   Planned Discharge Destination: Skilled nursing facility   Author: Coralie Keens, MD 02/18/2022 1:12 PM  For on call review [email or web address].

## 2022-02-19 DIAGNOSIS — L02413 Cutaneous abscess of right upper limb: Secondary | ICD-10-CM | POA: Diagnosis not present

## 2022-02-19 LAB — GLUCOSE, CAPILLARY
Glucose-Capillary: 127 mg/dL — ABNORMAL HIGH (ref 70–99)
Glucose-Capillary: 132 mg/dL — ABNORMAL HIGH (ref 70–99)
Glucose-Capillary: 169 mg/dL — ABNORMAL HIGH (ref 70–99)

## 2022-02-19 LAB — CBC WITH DIFFERENTIAL/PLATELET
Abs Immature Granulocytes: 0.38 10*3/uL — ABNORMAL HIGH (ref 0.00–0.07)
Basophils Absolute: 0.1 10*3/uL (ref 0.0–0.1)
Basophils Relative: 0 %
Eosinophils Absolute: 0.5 10*3/uL (ref 0.0–0.5)
Eosinophils Relative: 3 %
HCT: 27.2 % — ABNORMAL LOW (ref 36.0–46.0)
Hemoglobin: 8.3 g/dL — ABNORMAL LOW (ref 12.0–15.0)
Immature Granulocytes: 2 %
Lymphocytes Relative: 13 %
Lymphs Abs: 2.6 10*3/uL (ref 0.7–4.0)
MCH: 29.4 pg (ref 26.0–34.0)
MCHC: 30.5 g/dL (ref 30.0–36.0)
MCV: 96.5 fL (ref 80.0–100.0)
Monocytes Absolute: 1.1 10*3/uL — ABNORMAL HIGH (ref 0.1–1.0)
Monocytes Relative: 6 %
Neutro Abs: 15.2 10*3/uL — ABNORMAL HIGH (ref 1.7–7.7)
Neutrophils Relative %: 76 %
Platelets: 383 10*3/uL (ref 150–400)
RBC: 2.82 MIL/uL — ABNORMAL LOW (ref 3.87–5.11)
RDW: 15 % (ref 11.5–15.5)
WBC: 19.9 10*3/uL — ABNORMAL HIGH (ref 4.0–10.5)
nRBC: 0 % (ref 0.0–0.2)

## 2022-02-19 LAB — BASIC METABOLIC PANEL
Anion gap: 14 (ref 5–15)
BUN: 27 mg/dL — ABNORMAL HIGH (ref 8–23)
CO2: 28 mmol/L (ref 22–32)
Calcium: 8.3 mg/dL — ABNORMAL LOW (ref 8.9–10.3)
Chloride: 95 mmol/L — ABNORMAL LOW (ref 98–111)
Creatinine, Ser: 1.42 mg/dL — ABNORMAL HIGH (ref 0.44–1.00)
GFR, Estimated: 41 mL/min — ABNORMAL LOW (ref >60)
Glucose, Bld: 130 mg/dL — ABNORMAL HIGH (ref 70–99)
Potassium: 3.5 mmol/L (ref 3.5–5.1)
Sodium: 137 mmol/L (ref 135–145)

## 2022-02-19 NOTE — Progress Notes (Signed)
    Shared Decision Making/Informed Consent  The risks [esophageal damage, perforation (1:10,000 risk), bleeding, pharyngeal hematoma as well as other potential complications associated with conscious sedation including aspiration, arrhythmia, respiratory failure and death], benefits (treatment guidance and diagnostic support) and alternatives of a transesophageal echocardiogram were discussed in detail with Crystal Moses at bedside, she is not willing to proceed and states she just want to go home.  She is alert and oriented to person/place, not to time, mostely appropriate with conversation. She is mild cognitive impairment, documented dementia, not at full capacity for making decision. Called patient's son/health care proxy Crystal Moses at [phone], discussed above risk versus benefit of TEE, he states patient is currently not in the right mind for making the best medical decision at this time, he wishes TEE to be performed as requested by medical team, he understands the risk and benefits regarding the procedure. Encouraged Crystal Moses to communicate with her mother regarding this decision making and facilitate the process. Phone consent is verified by Ms Robet Leu PA.    TEE scheduled Friday 02/21/22, NPO after midnight. Noted improving AKI. Will repeat labs Thursday. Orders placed.

## 2022-02-19 NOTE — Progress Notes (Signed)
Patient ID: Crystal Moses, female   DOB: [birth year], 66 y.o.   MRN: [id number] Patient is seen in follow-up for debridement abscess and osteomyelitis right shoulder.  The wound VAC is functioning well approximately 275 cc of drainage.  Patient denies any shoulder pain.  She is much more alert this morning.  Her white cell count has slowly increased to 19.9.  Patient states that she just wants to go home.

## 2022-02-19 NOTE — Progress Notes (Signed)
PROGRESS NOTE  Crystal Moses  MHD:[id number] DOB: 18-Nov-1955 DOA: 02/10/2022 PCP: Charlynne Pander, MD   Brief Narrative: Patient is a 66 year old female with history of consider heart failure, hypertension who presented from SNF with altered mental status, weakness, right shoulder pain.  History of local trauma about a month ago on right shoulder.  Patient was found to have septic arthritis of right shoulder.  Orthopedics consulted.  Blood cultures showed E. coli.  ID also consulted. 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  On wound VAC.  Plan for TEE on 7/7.  Plan is to continue ceftriaxone for total of 6 weeks, last day on 03/28/2022.  Assessment & Plan:  Active Problems:   Abscess of right shoulder   Acute metabolic encephalopathy   AKI (acute kidney injury) (HCC)   CHF (congestive heart failure) (HCC)   DM (diabetes mellitus) (HCC)   HTN (hypertension)   Class 3 obesity (HCC)   Right shoulder septic arthritis: Orthopedics were following.  Status post debridement.  Currently on wound VAC which will be continued for total of 7 days.  She will follow-up with orthopedics as an outpatient.  E. coli bacteremia: Found to have bacteremia and UTI secondary to E. coli.  Still has leukocytosis.  Currently afebrile.  Last blood cultures no growth till date.  ID was following and recommended to continue ceftriaxone for total of 6 weeks, last dose on 8/11.  She needs to have TEE before discharge.  TEE planned for Friday.  Acute metabolic encephalopathy Multifactorial.  EEG negative for seizures.  Mental status has improved.  Continue delirium precautions.  AKI on CKD stage 2: Resolved.  Diuretic therapy on hold.  Baseline creatinine around 1.1.  Currently kidney function close to baseline.  Diastolic congestive heart failure: Currently euvolemic.  Echo done on this admission showed EF of 55%, grade 1 diastolic dysfunction.  Diabetes type 2: Continue sliding-scale  insulin, monitor blood sugars  Hypertension: Currently blood pressure stable.  Continue current medications  Morbid obesity: BMI 48.          DVT prophylaxis:SCDs Start: 02/14/22 1216 heparin injection 5,000 Units Start: 02/11/22 0600     Code Status: Full Code  Family Communication: None at bedside  Patient status:Inpatient  Patient is from :sNF  Anticipated discharge to:SNF  Estimated DC date:in 2-3 days   Consultants: ID, orthopedics  Procedures: Debridement of right shoulder  Antimicrobials:  Anti-infectives (From admission, onward)    Start     Dose/Rate Route Frequency Ordered Stop   02/14/22 1004  vancomycin (VANCOCIN) powder  Status:  Discontinued          As needed 02/14/22 1017 02/14/22 1035   02/14/22 0600  ceFAZolin (ANCEF) IVPB 2g/100 mL premix  Status:  Discontinued        2 g 200 mL/hr over 30 Minutes Intravenous On call to O.R. 02/13/22 2151 02/14/22 1121   02/12/22 2000  vancomycin (VANCOCIN) IVPB 1000 mg/200 mL premix  Status:  Discontinued        1,000 mg 200 mL/hr over 60 Minutes Intravenous Every 48 hours 02/10/22 1806 02/11/22 1329   02/12/22 1148  vancomycin (VANCOCIN) powder  Status:  Discontinued          As needed 02/12/22 1150 02/12/22 1224   02/12/22 0600  ceFAZolin (ANCEF) IVPB 3g/100 mL premix        3 g 200 mL/hr over 30 Minutes Intravenous On call to O.R. 02/11/22 1724 02/12/22 1135   02/11/22  1700  ceFEPIme (MAXIPIME) 2 g in sodium chloride 0.9 % 100 mL IVPB  Status:  Discontinued        2 g 200 mL/hr over 30 Minutes Intravenous Every 24 hours 02/10/22 1806 02/10/22 2256   02/11/22 1700  cefTRIAXone (ROCEPHIN) 2 g in sodium chloride 0.9 % 100 mL IVPB        2 g 200 mL/hr over 30 Minutes Intravenous Every 24 hours 02/10/22 2240     02/11/22 0600  metroNIDAZOLE (FLAGYL) IVPB 500 mg  Status:  Discontinued        500 mg 100 mL/hr over 60 Minutes Intravenous Every 12 hours 02/10/22 2242 02/11/22 1329   02/10/22 1700  vancomycin  (VANCOREADY) IVPB 2000 mg/400 mL        2,000 mg 200 mL/hr over 120 Minutes Intravenous  Once 02/10/22 1656 02/10/22 2037   02/10/22 1645  ceFEPIme (MAXIPIME) 2 g in sodium chloride 0.9 % 100 mL IVPB        2 g 200 mL/hr over 30 Minutes Intravenous  Once 02/10/22 1640 02/10/22 1733   02/10/22 1645  metroNIDAZOLE (FLAGYL) IVPB 500 mg        500 mg 100 mL/hr over 60 Minutes Intravenous  Once 02/10/22 1640 02/10/22 1837   02/10/22 1645  vancomycin (VANCOCIN) IVPB 1000 mg/200 mL premix  Status:  Discontinued        1,000 mg 200 mL/hr over 60 Minutes Intravenous  Once 02/10/22 1640 02/10/22 1656       Subjective:  Patient seen and examined the bedside this morning.  She was hemodynamically stable.  She was repeatedly saying I want to go home and was crying.  Not in apparent distress though.  Objective: Vitals:   02/18/22 1604 02/18/22 1925 02/19/22 0510 02/19/22 0924  BP: (!) 149/60 136/68 (!) 165/89   Pulse: 91 93 96   Resp: 20 17 18    Temp: 98.1 F (36.7 C) 98.1 F (36.7 C) 98.6 F (37 C) 98.2 F (36.8 C)  TempSrc: Oral Oral Oral Tympanic  SpO2: 97% 96% 98%   Weight:      Height:        Intake/Output Summary (Last 24 hours) at 02/19/2022 1140 Last data filed at 02/19/2022 0915 Gross per 24 hour  Intake 240 ml  Output 1325 ml  Net -1085 ml   Filed Weights   02/16/22 0500 02/17/22 0407 02/18/22 0400  Weight: 116.4 kg 116.1 kg 116 kg    Examination:  General exam: Overall comfortable, not in distress, obese HEENT: PERRL Respiratory system:  no wheezes or crackles  Cardiovascular system: S1 & S2 heard, RRR.  Gastrointestinal system: Abdomen is nondistended, soft and nontender. Central nervous system: Alert and awake, crying Extremities: Wound VAC on the right shoulder, no lower extremity edema, no clubbing ,no cyanosis Skin: No rashes, no ulcers,no icterus     Data Reviewed: I have personally reviewed following labs and imaging studies  CBC: Recent Labs  Lab  02/14/22 0240 02/16/22 0436 02/17/22 0850 02/17/22 1435 02/19/22 0313  WBC 11.5* 15.2* 17.4* 19.2* 19.9*  NEUTROABS  --   --   --  15.3* 15.2*  HGB 8.3* 8.2* 8.5* 8.4* 8.3*  HCT 28.3* 27.6* 27.0* 27.0* 27.2*  MCV 99.6 100.0 97.1 96.8 96.5  PLT 245 300 334 354 [id number]   Basic Metabolic Panel: Recent Labs  Lab 02/15/22 0328 02/16/22 0436 02/17/22 0850 02/18/22 0350 02/19/22 0313  NA 141 141 139 137 137  K 4.3 3.9 3.3*  3.7 3.5  CL 103 97* 95* 92* 95*  CO2 27 29 30 28 28   GLUCOSE 141* 123* 128* 121* 130*  BUN 55* 45* 38* 35* 27*  CREATININE 1.59* 1.65* 1.56* 1.62* 1.42*  CALCIUM 8.2* 8.3* 8.0* 8.2* 8.3*     Recent Results (from the past 240 hour(s))  Blood Culture (routine x 2)     Status: Abnormal   Collection Time: 02/10/22  4:40 PM   Specimen: BLOOD LEFT FOREARM  Result Value Ref Range Status   Specimen Description   Final    BLOOD LEFT FOREARM Performed at [facility], [address], [place], [place] [address]    Special Requests   Final    BOTTLES DRAWN AEROBIC AND ANAEROBIC Blood Culture adequate volume Performed at [facility], [address], [place], [place] [address]    Culture  Setup Time   Final    GRAM NEGATIVE RODS BOTTLES DRAWN AEROBIC AND ANAEROBIC Gram Stain Report Called to,Read Back By and Verified With: CHENEY,A@0521  BY MATTHEWS, B 6.27.2023 Performed at [facility], [address] [place], [place] [address]    Culture (A)  Final    ESCHERICHIA COLI SUSCEPTIBILITIES PERFORMED ON PREVIOUS CULTURE WITHIN THE LAST 5 DAYS. Performed at [facility], [place] [address] [place], [place] [address]    Report Status 02/14/2022 FINAL  Final  Blood Culture (routine x 2)     Status: Abnormal   Collection Time: 02/10/22  4:45 PM   Specimen: BLOOD RIGHT HAND  Result Value Ref Range Status   Specimen Description   Final    BLOOD RIGHT HAND Performed at [facility], [address] [place], [place] [address]    Special Requests   Final    BOTTLES  DRAWN AEROBIC AND ANAEROBIC Blood Culture adequate volume Performed at [address] [place], [place] [address]    Culture  Setup Time   Final    GRAM NEGATIVE RODS Gram Stain Report Called to,Read Back By and Verified With: CHENEY,A@0521  BY MATTHEWS, B 6.27.2023 IN BOTH AEROBIC AND ANAEROBIC BOTTLES CRITICAL RESULT CALLED TO, READ BACK BY AND VERIFIED WITH:  C/ PHARMD ELIZABETH MARTIN 02/11/22 1212 A. LAFRANCE Performed at [facility], [place] [address], [place],  [address]    Culture ESCHERICHIA COLI (A)  Final   Report Status 02/14/2022 FINAL  Final   Organism ID, Bacteria ESCHERICHIA COLI  Final      Susceptibility   Escherichia coli - MIC*    AMPICILLIN <=2 SENSITIVE Sensitive     CEFAZOLIN <=4 SENSITIVE Sensitive     CEFEPIME <=0.12 SENSITIVE Sensitive     CEFTAZIDIME <=1 SENSITIVE Sensitive     CEFTRIAXONE <=0.25 SENSITIVE Sensitive     CIPROFLOXACIN <=0.25 SENSITIVE Sensitive     GENTAMICIN <=1 SENSITIVE Sensitive     IMIPENEM <=0.25 SENSITIVE Sensitive     TRIMETH/SULFA <=20 SENSITIVE Sensitive     AMPICILLIN/SULBACTAM <=2 SENSITIVE Sensitive     PIP/TAZO <=4 SENSITIVE Sensitive     * ESCHERICHIA COLI  Blood Culture ID Panel (Reflexed)     Status: Abnormal   Collection Time: 02/10/22  4:45 PM  Result Value Ref Range Status   Enterococcus faecalis NOT DETECTED NOT DETECTED Final   Enterococcus Faecium NOT DETECTED NOT DETECTED Final   Listeria monocytogenes NOT DETECTED NOT DETECTED Final   Staphylococcus species NOT DETECTED NOT DETECTED Final   Staphylococcus aureus (BCID) NOT DETECTED NOT DETECTED Final   Staphylococcus epidermidis NOT DETECTED  NOT DETECTED Final   Staphylococcus lugdunensis NOT DETECTED NOT DETECTED Final   Streptococcus species NOT DETECTED NOT DETECTED Final   Streptococcus agalactiae NOT DETECTED NOT DETECTED Final   Streptococcus pneumoniae NOT DETECTED NOT DETECTED Final   Streptococcus pyogenes NOT DETECTED NOT  DETECTED Final   A.calcoaceticus-baumannii NOT DETECTED NOT DETECTED Final   Bacteroides fragilis NOT DETECTED NOT DETECTED Final   Enterobacterales DETECTED (A) NOT DETECTED Final    Comment: Enterobacterales represent a large order of gram negative bacteria, not a single organism. CRITICAL RESULT CALLED TO, READ BACK BY AND VERIFIED WITH:  C/ PHARMD ELIZABETH MARTIN 02/11/22 1212 A. LAFRANCE    Enterobacter cloacae complex NOT DETECTED NOT DETECTED Final   Escherichia coli DETECTED (A) NOT DETECTED Final    Comment: CRITICAL RESULT CALLED TO, READ BACK BY AND VERIFIED WITH:  C/ PHARMD ELIZABETH MARTIN 02/11/22 1212 A. LAFRANCE    Klebsiella aerogenes NOT DETECTED NOT DETECTED Final   Klebsiella oxytoca NOT DETECTED NOT DETECTED Final   Klebsiella pneumoniae NOT DETECTED NOT DETECTED Final   Proteus species NOT DETECTED NOT DETECTED Final   Salmonella species NOT DETECTED NOT DETECTED Final   Serratia marcescens NOT DETECTED NOT DETECTED Final   Haemophilus influenzae NOT DETECTED NOT DETECTED Final   Neisseria meningitidis NOT DETECTED NOT DETECTED Final   Pseudomonas aeruginosa NOT DETECTED NOT DETECTED Final   Stenotrophomonas maltophilia NOT DETECTED NOT DETECTED Final   Candida albicans NOT DETECTED NOT DETECTED Final   Candida auris NOT DETECTED NOT DETECTED Final   Candida glabrata NOT DETECTED NOT DETECTED Final   Candida krusei NOT DETECTED NOT DETECTED Final   Candida parapsilosis NOT DETECTED NOT DETECTED Final   Candida tropicalis NOT DETECTED NOT DETECTED Final   Cryptococcus neoformans/gattii NOT DETECTED NOT DETECTED Final   CTX-M ESBL NOT DETECTED NOT DETECTED Final   Carbapenem resistance IMP NOT DETECTED NOT DETECTED Final   Carbapenem resistance KPC NOT DETECTED NOT DETECTED Final   Carbapenem resistance NDM NOT DETECTED NOT DETECTED Final   Carbapenem resist OXA 48 LIKE NOT DETECTED NOT DETECTED Final   Carbapenem resistance VIM NOT DETECTED NOT DETECTED Final     Comment: Performed at [facility], [address] [place], [place] [address]  Resp Panel by RT-PCR (Flu A&B, Covid) Anterior Nasal Swab     Status: None   Collection Time: 02/10/22  4:48 PM   Specimen: Anterior Nasal Swab  Result Value Ref Range Status   SARS Coronavirus 2 by RT PCR NEGATIVE NEGATIVE Final    Comment: (NOTE) SARS-CoV-2 target nucleic acids are NOT DETECTED.  The SARS-CoV-2 RNA is generally detectable in upper respiratory specimens during the acute phase of infection. The lowest concentration of SARS-CoV-2 viral copies this assay can detect is 138 copies/mL. A negative result does not preclude SARS-Cov-2 infection and should not be used as the sole basis for treatment or other patient management decisions. A negative result may occur with  improper specimen collection/handling, submission of specimen other than nasopharyngeal swab, presence of viral mutation(s) within the areas targeted by this assay, and inadequate number of viral copies(<138 copies/mL). A negative result must be combined with clinical observations, patient history, and epidemiological information. The expected result is Negative.  Fact Sheet for Patients:  EntrepreneurPulse.com.au  Fact Sheet for Healthcare Providers:  IncredibleEmployment.be  This test is no t yet approved or cleared by the [place] FDA and  has been authorized for detection and/or diagnosis of SARS-CoV-2 by FDA under an  Emergency Use Authorization (EUA). This EUA will remain  in effect (meaning this test can be used) for the duration of the COVID-19 declaration under Section 564(b)(1) of the Act, 21 U.S.C.section 360bbb-3(b)(1), unless the authorization is terminated  or revoked sooner.       Influenza A by PCR NEGATIVE NEGATIVE Final   Influenza B by PCR NEGATIVE NEGATIVE Final    Comment: (NOTE) The Xpert Xpress SARS-CoV-2/FLU/RSV plus assay is intended as an aid in  the diagnosis of influenza from Nasopharyngeal swab specimens and should not be used as a sole basis for treatment. Nasal washings and aspirates are unacceptable for Xpert Xpress SARS-CoV-2/FLU/RSV testing.  Fact Sheet for Patients: EntrepreneurPulse.com.au  Fact Sheet for Healthcare Providers: IncredibleEmployment.be  This test is not yet approved or cleared by the [place] FDA and has been authorized for detection and/or diagnosis of SARS-CoV-2 by FDA under an Emergency Use Authorization (EUA). This EUA will remain in effect (meaning this test can be used) for the duration of the COVID-19 declaration under Section 564(b)(1) of the Act, 21 U.S.C. section 360bbb-3(b)(1), unless the authorization is terminated or revoked.  Performed at [facility], [address] [place], [place] [address]   Urine Culture     Status: Abnormal   Collection Time: 02/10/22  5:05 PM   Specimen: Urine, Clean Catch  Result Value Ref Range Status   Specimen Description   Final    URINE, CLEAN CATCH Performed at [facility], [address], [place], [place] [address]    Special Requests   Final    NONE Performed at [facility], [address] [place], [place] [address]    Culture (A)  Final    >=100,000 COLONIES/mL ESCHERICHIA COLI 30,000 COLONIES/mL VIRIDANS STREPTOCOCCUS Standardized susceptibility testing for this organism is not available. Performed at [facility], [place] [address], [place], [place] [address]    Report Status 02/13/2022 FINAL  Final   Organism ID, Bacteria ESCHERICHIA COLI (A)  Final      Susceptibility   Escherichia coli - MIC*    AMPICILLIN <=2 SENSITIVE Sensitive     CEFAZOLIN <=4 SENSITIVE Sensitive     CEFEPIME <=0.12 SENSITIVE Sensitive     CEFTRIAXONE <=0.25 SENSITIVE Sensitive     CIPROFLOXACIN <=0.25 SENSITIVE Sensitive     GENTAMICIN >=16 RESISTANT Resistant     IMIPENEM <=0.25 SENSITIVE Sensitive     NITROFURANTOIN  <=16 SENSITIVE Sensitive     TRIMETH/SULFA <=20 SENSITIVE Sensitive     AMPICILLIN/SULBACTAM <=2 SENSITIVE Sensitive     PIP/TAZO <=4 SENSITIVE Sensitive     * >=100,000 COLONIES/mL ESCHERICHIA COLI  Aerobic Culture w Gram Stain (superficial specimen)     Status: None   Collection Time: 02/10/22  7:20 PM   Specimen: Abscess  Result Value Ref Range Status   Specimen Description   Final    ABSCESS Performed at [facility], [address] [place], [place] [address]    Special Requests   Final    Normal Performed at [facility], [address] [place], [place] [address]    Gram Stain   Final    NO WBC SEEN RARE GRAM NEGATIVE RODS RARE GRAM POSITIVE RODS Performed at [facility], [place] [address], [place], [place] [address]    Culture FEW ESCHERICHIA COLI  Final   Report Status 02/13/2022 FINAL  Final   Organism ID, Bacteria ESCHERICHIA COLI  Final      Susceptibility   Escherichia coli - MIC*  AMPICILLIN <=2 SENSITIVE Sensitive     CEFAZOLIN <=4 SENSITIVE Sensitive     CEFEPIME <=0.12 SENSITIVE Sensitive     CEFTAZIDIME <=1 SENSITIVE Sensitive     CEFTRIAXONE <=0.25 SENSITIVE Sensitive     CIPROFLOXACIN <=0.25 SENSITIVE Sensitive     GENTAMICIN <=1 SENSITIVE Sensitive     IMIPENEM <=0.25 SENSITIVE Sensitive     TRIMETH/SULFA <=20 SENSITIVE Sensitive     AMPICILLIN/SULBACTAM <=2 SENSITIVE Sensitive     PIP/TAZO <=4 SENSITIVE Sensitive     * FEW ESCHERICHIA COLI  Surgical pcr screen     Status: None   Collection Time: 02/11/22  6:11 PM  Result Value Ref Range Status   MRSA, PCR NEGATIVE NEGATIVE Final   Staphylococcus aureus NEGATIVE NEGATIVE Final    Comment: (NOTE) The Xpert SA Assay (FDA approved for NASAL specimens in patients 41 years of age and older), is one component of a comprehensive surveillance program. It is not intended to diagnose infection nor to guide or monitor treatment. Performed at [facility], [address], [place],  [place] [address]   Aerobic/Anaerobic Culture w Gram Stain (surgical/deep wound)     Status: None   Collection Time: 02/12/22 11:41 AM   Specimen: Abscess  Result Value Ref Range Status   Specimen Description ABSCESS  Final   Special Requests RIGHT SHOULDER  Final   Gram Stain   Final    FEW WBC PRESENT, PREDOMINANTLY PMN NO ORGANISMS SEEN    Culture   Final    RARE ESCHERICHIA COLI NO ANAEROBES ISOLATED Performed at [facility], [address] [place], [place] [address]    Report Status 02/17/2022 FINAL  Final   Organism ID, Bacteria ESCHERICHIA COLI  Final      Susceptibility   Escherichia coli - MIC*    AMPICILLIN <=2 SENSITIVE Sensitive     CEFAZOLIN <=4 SENSITIVE Sensitive     CEFEPIME <=0.12 SENSITIVE Sensitive     CEFTAZIDIME <=1 SENSITIVE Sensitive     CEFTRIAXONE <=0.25 SENSITIVE Sensitive     CIPROFLOXACIN <=0.25 SENSITIVE Sensitive     GENTAMICIN <=1 SENSITIVE Sensitive     IMIPENEM <=0.25 SENSITIVE Sensitive     TRIMETH/SULFA <=20 SENSITIVE Sensitive     AMPICILLIN/SULBACTAM <=2 SENSITIVE Sensitive     PIP/TAZO <=4 SENSITIVE Sensitive     * RARE ESCHERICHIA COLI  Culture, blood (Routine X 2) w Reflex to ID Panel     Status: None   Collection Time: 02/12/22  4:41 PM   Specimen: BLOOD  Result Value Ref Range Status   Specimen Description BLOOD SITE NOT SPECIFIED  Final   Special Requests   Final    BOTTLES DRAWN AEROBIC AND ANAEROBIC Blood Culture adequate volume   Culture   Final    NO GROWTH 5 DAYS Performed at [facility], [address], [place], [place] [address]    Report Status 02/17/2022 FINAL  Final  Culture, blood (Routine X 2) w Reflex to ID Panel     Status: None   Collection Time: 02/12/22  5:03 PM   Specimen: BLOOD  Result Value Ref Range Status   Specimen Description BLOOD BLOOD LEFT HAND  Final   Special Requests   Final    BOTTLES DRAWN AEROBIC AND ANAEROBIC Blood Culture adequate volume   Culture   Final    NO GROWTH 5  DAYS Performed at [facility], [address], [place], [place] [address]    Report  Status 02/17/2022 FINAL  Final     Radiology Studies: No results found.  Scheduled Meds:  acetaminophen  650 mg Oral Q6H   ALPRAZolam  0.5 mg Oral TID   Chlorhexidine Gluconate Cloth  6 each Topical Daily   docusate sodium  100 mg Oral BID   escitalopram  20 mg Oral Daily   folic acid  1 mg Oral Daily   heparin  5,000 Units Subcutaneous Q8H   lidocaine  1 patch Transdermal Q24H   sodium chloride flush  10-40 mL Intracatheter Q12H   Continuous Infusions:  sodium chloride 10 mL/hr at 02/14/22 1800   cefTRIAXone (ROCEPHIN)  IV 2 g (02/18/22 1721)   methocarbamol (ROBAXIN) IV       LOS: 9 days   Burnadette Pop, MD [facility] P7/12/2021, 11:40 AM

## 2022-02-19 NOTE — Progress Notes (Signed)
Physical Therapy Treatment Patient Details Name: Crystal Moses MRN: [id number] DOB: December 14, 1955 Today's Date: 02/19/2022   History of Present Illness 66 y.o. female presents to [facility] hospital on 02/10/2022 with AMS, weakness and R shoulder pain. Pt admitted for management of sepsis with abscess of R shoulder. Pt underwent R shoulder debridement with resection of humeral head on 02/12/2022, repeat debridement on 6/30. PMH includes CAD, CHF, DM, HLD, HTN.    PT Comments    Pt received supine and agreeable to session with slow but steady progress towards acute goals. Pt able to come to sitting EOB with +1 mod assist to elevate trunk and step by step cues throughout. Pt with better initiation and use of LUE to assist. Pt able to maintain sitting balance without assist for majority of session, however limited by sacrum/bottom pain. Pt with fair tolerance for LE and UE therex for increased ROM and pt with fait compliance with WB precautions needing some cues to not WB through elbow during bed mobility. Pt continues to benefit from skilled PT services to progress toward functional mobility goals.    Recommendations for follow up therapy are one component of a multi-disciplinary discharge planning process, led by the attending physician.  Recommendations may be updated based on patient status, additional functional criteria and insurance authorization.  Follow Up Recommendations  Skilled nursing-short term rehab (<3 hours/day) Can patient physically be transported by private vehicle: No   Assistance Recommended at Discharge Intermittent Supervision/Assistance  Patient can return home with the following Two people to help with walking and/or transfers;Two people to help with bathing/dressing/bathroom;Assistance with cooking/housework;Assistance with feeding;Direct supervision/assist for medications management;Direct supervision/assist for financial management;Assist for transportation;Help with stairs or ramp for  entrance   Equipment Recommendations   (defer to post-acute setting)    Recommendations for Other Services       Precautions / Restrictions Precautions Precautions: Fall Required Braces or Orthoses: Sling Restrictions Weight Bearing Restrictions: Yes RUE Weight Bearing: Non weight bearing Other Position/Activity Restrictions: no ROM restrictions to RUE per Dr. Lajoyce Corners, pt shoulder mobility will be limited due to humeral head resection     Mobility  Bed Mobility Overal bed mobility: Needs Assistance Bed Mobility: Supine to Sit, Sit to Supine     Supine to sit: Mod assist, HOB elevated Sit to supine: Max assist   General bed mobility comments: mod assist to elevate trunk with step by step cues needed to bring LEs to and off EOB, max assist to bring LEs back into bed and to reposition    Transfers Overall transfer level: Needs assistance Equipment used: None Transfers: Bed to chair/wheelchair/BSC            Lateral/Scoot Transfers: Max assist General transfer comment: max asssit to scoot along EOB toward HOB x3 with pt able to generate minimal power throughout LEs, LUE to assist    Ambulation/Gait               General Gait Details: unable   Stairs             Wheelchair Mobility    Modified Rankin (Stroke Patients Only)       Balance Overall balance assessment: Needs assistance Sitting-balance support: No upper extremity supported, Feet supported Sitting balance-Leahy Scale: Fair Sitting balance - Comments: anble to maintain for majority of session without assist, limited by pain on bottom  Cognition Arousal/Alertness: Awake/alert Behavior During Therapy: Anxious                                 Problem Solving: Slow processing, Requires verbal cues General Comments: agreeable and willing to participate within tolerance        Exercises General Exercises - Upper  Extremity Shoulder Flexion: PROM, Right, 10 reps Shoulder Extension: PROM, Right, 10 reps Shoulder ABduction: PROM, Right, 5 reps Elbow Flexion: AAROM, Right, 10 reps Elbow Extension: AROM, Right, 10 reps Wrist Flexion: AROM, Right, 10 reps Wrist Extension: AROM, Right, 10 reps Digit Composite Flexion: AROM, Right, 10 reps Composite Extension: AROM, Right, 10 reps General Exercises - Lower Extremity Long Arc Quad: AROM, AAROM, Right, Left, 10 reps, Seated Hip ABduction/ADduction: AROM, Right, Left, 20 reps, Seated Heel Raises: AROM, Right, Left, 10 reps, Seated    General Comments General comments (skin integrity, edema, etc.): VSS on RA      Pertinent Vitals/Pain Pain Assessment Pain Assessment: Faces Faces Pain Scale: Hurts even more Pain Location: bottom Pain Descriptors / Indicators: Grimacing, Moaning, Sore Pain Intervention(s): Monitored during session, Limited activity within patient's tolerance    Home Living                          Prior Function            PT Goals (current goals can now be found in the care plan section) Acute Rehab PT Goals Patient Stated Goal: to reduce pain PT Goal Formulation: With patient Time For Goal Achievement: 03/01/22    Frequency    Min 2X/week      PT Plan      Co-evaluation              AM-PAC PT "6 Clicks" Mobility   Outcome Measure  Help needed turning from your back to your side while in a flat bed without using bedrails?: Total Help needed moving from lying on your back to sitting on the side of a flat bed without using bedrails?: Total Help needed moving to and from a bed to a chair (including a wheelchair)?: Total Help needed standing up from a chair using your arms (e.g., wheelchair or bedside chair)?: Total Help needed to walk in hospital room?: Total Help needed climbing 3-5 steps with a railing? : Total 6 Click Score: 6    End of Session Equipment Utilized During Treatment: Other  (comment) (sling) Activity Tolerance: Patient limited by pain Patient left: in bed;with call bell/phone within reach;with bed alarm set Nurse Communication: Mobility status;Need for lift equipment PT Visit Diagnosis: Other abnormalities of gait and mobility (R26.89);Muscle weakness (generalized) (M62.81);Pain Pain - Right/Left: Right Pain - part of body: Shoulder     Time: 1130-1153 PT Time Calculation (min) (ACUTE ONLY): 23 min  Charges:  $Therapeutic Exercise: 8-22 mins $Therapeutic Activity: 8-22 mins                     Kayah Hecker R. PTA Acute Rehabilitation Services Office: [phone]    Catalina Antigua 02/19/2022, 12:05 PM

## 2022-02-19 NOTE — Progress Notes (Signed)
Pt unwilling to turn to remove Lidocaine patch. Will remove patch when pt willing to turn.

## 2022-02-20 DIAGNOSIS — L02413 Cutaneous abscess of right upper limb: Secondary | ICD-10-CM | POA: Diagnosis not present

## 2022-02-20 LAB — BASIC METABOLIC PANEL
Anion gap: 13 (ref 5–15)
BUN: 23 mg/dL (ref 8–23)
CO2: 28 mmol/L (ref 22–32)
Calcium: 8.1 mg/dL — ABNORMAL LOW (ref 8.9–10.3)
Chloride: 96 mmol/L — ABNORMAL LOW (ref 98–111)
Creatinine, Ser: 1.26 mg/dL — ABNORMAL HIGH (ref 0.44–1.00)
GFR, Estimated: 47 mL/min — ABNORMAL LOW (ref >60)
Glucose, Bld: 117 mg/dL — ABNORMAL HIGH (ref 70–99)
Potassium: 3.5 mmol/L (ref 3.5–5.1)
Sodium: 137 mmol/L (ref 135–145)

## 2022-02-20 LAB — GLUCOSE, CAPILLARY
Glucose-Capillary: 109 mg/dL — ABNORMAL HIGH (ref 70–99)
Glucose-Capillary: 128 mg/dL — ABNORMAL HIGH (ref 70–99)
Glucose-Capillary: 131 mg/dL — ABNORMAL HIGH (ref 70–99)

## 2022-02-20 LAB — CBC
HCT: 27.6 % — ABNORMAL LOW (ref 36.0–46.0)
Hemoglobin: 8.4 g/dL — ABNORMAL LOW (ref 12.0–15.0)
MCH: 29.7 pg (ref 26.0–34.0)
MCHC: 30.4 g/dL (ref 30.0–36.0)
MCV: 97.5 fL (ref 80.0–100.0)
Platelets: 363 10*3/uL (ref 150–400)
RBC: 2.83 MIL/uL — ABNORMAL LOW (ref 3.87–5.11)
RDW: 15.3 % (ref 11.5–15.5)
WBC: 16.4 10*3/uL — ABNORMAL HIGH (ref 4.0–10.5)
nRBC: 0 % (ref 0.0–0.2)

## 2022-02-20 NOTE — Progress Notes (Signed)
PROGRESS NOTE  Crystal Moses  [id number] DOB: May 01, 1956 DOA: 02/10/2022 PCP: Caprice Renshaw, MD   Brief Narrative: Patient is a 66 year old female with history of consider heart failure, hypertension who presented from SNF with altered mental status, weakness, right shoulder pain.  History of local trauma about a month ago on right shoulder.  Patient was found to have septic arthritis of right shoulder.  Orthopedics consulted.  Blood cultures showed E. coli.  ID also consulted. 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  On wound VAC.  Plan for TEE on 7/7.  Plan is to continue ceftriaxone for total of 6 weeks, last day on 03/28/2022.  Possibility of discharge to skilled nursing facility tomorrow after TEE  Assessment & Plan:  Active Problems:   Abscess of right shoulder   Acute metabolic encephalopathy   AKI (acute kidney injury) ([place])   CHF (congestive heart failure) (HCC)   DM (diabetes mellitus) ([place])   HTN (hypertension)   Class 3 obesity (HCC)   Right shoulder septic arthritis: Orthopedics were following.  Status post debridement.  Currently on wound VAC which will be continued for total of 7 days.  She will follow-up with orthopedics as an outpatient.  E. coli bacteremia: Found to have bacteremia and UTI secondary to E. coli.  Still has leukocytosis.  Currently afebrile.  Last blood cultures no growth till date.  ID was following and recommended to continue ceftriaxone for total of 6 weeks, last dose on 8/11.  She needs to have TEE before discharge.  TEE planned for Friday.  Acute metabolic encephalopathy Multifactorial.  EEG negative for seizures.  Mental status has improved.  Continue delirium precautions.  Continues to cry intermittently and says he wants to go home.  AKI on CKD stage 2: Resolved.  Diuretic therapy on hold.  Baseline creatinine around 1.1.  Currently kidney function close to baseline.  Diastolic congestive heart failure: Currently  euvolemic.  Echo done on this admission showed EF of [id number], grade 1 diastolic dysfunction.  Diabetes type 2: Continue sliding-scale insulin, monitor blood sugars  Hypertension: Currently blood pressure stable.  Continue current medications  Morbid obesity: BMI 48.          DVT prophylaxis:SCDs Start: 02/14/22 1216 heparin injection 5,000 Units Start: 02/11/22 0600     Code Status: Full Code  Family Communication: Called son Vonna Kotyk on phone today, call not received.  Patient status:Inpatient  Patient is from :sNF  Anticipated discharge to:SNF  Estimated DC date tomorrow   Consultants: ID, orthopedics  Procedures: Debridement of right shoulder  Antimicrobials:  Anti-infectives (From admission, onward)    Start     Dose/Rate Route Frequency Ordered Stop   02/14/22 1004  vancomycin (VANCOCIN) powder  Status:  Discontinued          As needed 02/14/22 1017 02/14/22 1035   02/14/22 0600  ceFAZolin (ANCEF) IVPB 2g/100 mL premix  Status:  Discontinued        2 g 200 mL/hr over 30 Minutes Intravenous On call to O.R. 02/13/22 2151 02/14/22 1121   02/12/22 2000  vancomycin (VANCOCIN) IVPB 1000 mg/200 mL premix  Status:  Discontinued        1,000 mg 200 mL/hr over 60 Minutes Intravenous Every 48 hours 02/10/22 1806 02/11/22 1329   02/12/22 1148  vancomycin (VANCOCIN) powder  Status:  Discontinued          As needed 02/12/22 1150 02/12/22 1224   02/12/22 0600  ceFAZolin (ANCEF) IVPB 3g/100  mL premix        3 g 200 mL/hr over 30 Minutes Intravenous On call to O.R. 02/11/22 1724 02/12/22 1135   02/11/22 1700  ceFEPIme (MAXIPIME) 2 g in sodium chloride 0.9 % 100 mL IVPB  Status:  Discontinued        2 g 200 mL/hr over 30 Minutes Intravenous Every 24 hours 02/10/22 1806 02/10/22 2256   02/11/22 1700  cefTRIAXone (ROCEPHIN) 2 g in sodium chloride 0.9 % 100 mL IVPB        2 g 200 mL/hr over 30 Minutes Intravenous Every 24 hours 02/10/22 2240     02/11/22 0600  metroNIDAZOLE  (FLAGYL) IVPB 500 mg  Status:  Discontinued        500 mg 100 mL/hr over 60 Minutes Intravenous Every 12 hours 02/10/22 2242 02/11/22 1329   02/10/22 1700  vancomycin (VANCOREADY) IVPB 2000 mg/400 mL        2,000 mg 200 mL/hr over 120 Minutes Intravenous  Once 02/10/22 1656 02/10/22 2037   02/10/22 1645  ceFEPIme (MAXIPIME) 2 g in sodium chloride 0.9 % 100 mL IVPB        2 g 200 mL/hr over 30 Minutes Intravenous  Once 02/10/22 1640 02/10/22 1733   02/10/22 1645  metroNIDAZOLE (FLAGYL) IVPB 500 mg        500 mg 100 mL/hr over 60 Minutes Intravenous  Once 02/10/22 1640 02/10/22 1837   02/10/22 1645  vancomycin (VANCOCIN) IVPB 1000 mg/200 mL premix  Status:  Discontinued        1,000 mg 200 mL/hr over 60 Minutes Intravenous  Once 02/10/22 1640 02/10/22 1656       Subjective:  Patient seen and examined the bedside this morning.  When I entered the room, she was comfortably lying in bed.  Hemodynamically stable.  Soon as I started to talk, she started crying and said she wants to go home.  She states she has been treated like a dirt/dog.  I assured her that we are waiting for TEE, there is a possibility of being discharged tomorrow.  Objective: Vitals:   02/19/22 2005 02/19/22 2335 02/20/22 0600 02/20/22 0708  BP: (!) 151/73 (!) 159/85 (!) 148/92 127/80  Pulse: 91  92 91  Resp: 20 20 20 20   Temp: 98.7 F (37.1 C) 98.4 F (36.9 C) 98 F (36.7 C) 98 F (36.7 C)  TempSrc: Oral Oral Oral Oral  SpO2: 100% 97% 96% 93%  Weight:   111 kg   Height:        Intake/Output Summary (Last 24 hours) at 02/20/2022 1136 Last data filed at 02/20/2022 0715 Gross per 24 hour  Intake --  Output 600 ml  Net -600 ml   Filed Weights   02/17/22 0407 02/18/22 0400 02/20/22 0600  Weight: 116.1 kg 116 kg 111 kg    Examination:    General exam: Crying, obese, overall comfortable HEENT: PERRL Respiratory system:  no wheezes or crackles, diminished air sounds bilateral bases Cardiovascular system:  S1 & S2 heard, RRR.  Gastrointestinal system: Abdomen is nondistended, soft and nontender. Central nervous system: Alert and awake Extremities: Wound VAC on the right shoulder, no edema, no clubbing ,no cyanosis Skin: No rashes, no ulcers,no icterus     Data Reviewed: I have personally reviewed following labs and imaging studies  CBC: Recent Labs  Lab 02/16/22 0436 02/17/22 0850 02/17/22 1435 02/19/22 0313 02/20/22 0555  WBC 15.2* 17.4* 19.2* 19.9* 16.4*  NEUTROABS  --   --  15.3* 15.2*  --   HGB 8.2* 8.5* 8.4* 8.3* 8.4*  HCT 27.6* 27.0* 27.0* 27.2* 27.6*  MCV 100.0 97.1 96.8 96.5 97.5  PLT 300 334 354 383 [id number]   Basic Metabolic Panel: Recent Labs  Lab 02/16/22 0436 02/17/22 0850 02/18/22 0350 02/19/22 0313 02/20/22 0555  NA 141 139 137 137 137  K 3.9 3.3* 3.7 3.5 3.5  CL 97* 95* 92* 95* 96*  CO2 29 30 28 28 28   GLUCOSE 123* 128* 121* 130* 117*  BUN 45* 38* 35* 27* 23  CREATININE 1.65* 1.56* 1.62* 1.42* 1.26*  CALCIUM 8.3* 8.0* 8.2* 8.3* 8.1*     Recent Results (from the past 240 hour(s))  Blood Culture (routine x 2)     Status: Abnormal   Collection Time: 02/10/22  4:40 PM   Specimen: BLOOD LEFT FOREARM  Result Value Ref Range Status   Specimen Description   Final    BLOOD LEFT FOREARM Performed at [facility], [address], [place], [place] [address]    Special Requests   Final    BOTTLES DRAWN AEROBIC AND ANAEROBIC Blood Culture adequate volume Performed at [facility], [address] [place], [place] [address]    Culture  Setup Time   Final    GRAM NEGATIVE RODS BOTTLES DRAWN AEROBIC AND ANAEROBIC Gram Stain Report Called to,Read Back By and Verified With: CHENEY,A@0521  BY MATTHEWS, B 6.27.2023 Performed at [facility], [address], [place], [place] [address]    Culture (A)  Final    ESCHERICHIA COLI SUSCEPTIBILITIES PERFORMED ON PREVIOUS CULTURE WITHIN THE LAST 5 DAYS. Performed at [facility], [place] [address], [place], [place]  [address]    Report Status 02/14/2022 FINAL  Final  Blood Culture (routine x 2)     Status: Abnormal   Collection Time: 02/10/22  4:45 PM   Specimen: BLOOD RIGHT HAND  Result Value Ref Range Status   Specimen Description   Final    BLOOD RIGHT HAND Performed at [facility], [address] [place], [place] [address]    Special Requests   Final    BOTTLES DRAWN AEROBIC AND ANAEROBIC Blood Culture adequate volume Performed at [place] [place], [place] [address]    Culture  Setup Time   Final    GRAM NEGATIVE RODS Gram Stain Report Called to,Read Back By and Verified With: CHENEY,A@0521  BY MATTHEWS, B 6.27.2023 IN BOTH AEROBIC AND ANAEROBIC BOTTLES CRITICAL RESULT CALLED TO, READ BACK BY AND VERIFIED WITH:  C/ PHARMD ELIZABETH MARTIN 02/11/22 1212 A. LAFRANCE Performed at [facility], [place] [address] [place], [place] [address]    Culture ESCHERICHIA COLI (A)  Final   Report Status 02/14/2022 FINAL  Final   Organism ID, Bacteria ESCHERICHIA COLI  Final      Susceptibility   Escherichia coli - MIC*    AMPICILLIN <=2 SENSITIVE Sensitive     CEFAZOLIN <=4 SENSITIVE Sensitive     CEFEPIME <=0.12 SENSITIVE Sensitive     CEFTAZIDIME <=1 SENSITIVE Sensitive     CEFTRIAXONE <=0.25 SENSITIVE Sensitive     CIPROFLOXACIN <=0.25 SENSITIVE Sensitive     GENTAMICIN <=1 SENSITIVE Sensitive     IMIPENEM <=0.25 SENSITIVE Sensitive     TRIMETH/SULFA <=20 SENSITIVE Sensitive     AMPICILLIN/SULBACTAM <=2 SENSITIVE Sensitive     PIP/TAZO <=4 SENSITIVE Sensitive     * ESCHERICHIA COLI  Blood Culture ID Panel (Reflexed)     Status: Abnormal  Collection Time: 02/10/22  4:45 PM  Result Value Ref Range Status   Enterococcus faecalis NOT DETECTED NOT DETECTED Final   Enterococcus Faecium NOT DETECTED NOT DETECTED Final   Listeria monocytogenes NOT DETECTED NOT DETECTED Final   Staphylococcus species NOT DETECTED NOT DETECTED Final   Staphylococcus aureus (BCID) NOT DETECTED NOT  DETECTED Final   Staphylococcus epidermidis NOT DETECTED NOT DETECTED Final   Staphylococcus lugdunensis NOT DETECTED NOT DETECTED Final   Streptococcus species NOT DETECTED NOT DETECTED Final   Streptococcus agalactiae NOT DETECTED NOT DETECTED Final   Streptococcus pneumoniae NOT DETECTED NOT DETECTED Final   Streptococcus pyogenes NOT DETECTED NOT DETECTED Final   A.calcoaceticus-baumannii NOT DETECTED NOT DETECTED Final   Bacteroides fragilis NOT DETECTED NOT DETECTED Final   Enterobacterales DETECTED (A) NOT DETECTED Final    Comment: Enterobacterales represent a large order of gram negative bacteria, not a single organism. CRITICAL RESULT CALLED TO, READ BACK BY AND VERIFIED WITH:  C/ PHARMD ELIZABETH MARTIN 02/11/22 1212 A. LAFRANCE    Enterobacter cloacae complex NOT DETECTED NOT DETECTED Final   Escherichia coli DETECTED (A) NOT DETECTED Final    Comment: CRITICAL RESULT CALLED TO, READ BACK BY AND VERIFIED WITH:  C/ PHARMD ELIZABETH MARTIN 02/11/22 1212 A. LAFRANCE    Klebsiella aerogenes NOT DETECTED NOT DETECTED Final   Klebsiella oxytoca NOT DETECTED NOT DETECTED Final   Klebsiella pneumoniae NOT DETECTED NOT DETECTED Final   Proteus species NOT DETECTED NOT DETECTED Final   Salmonella species NOT DETECTED NOT DETECTED Final   Serratia marcescens NOT DETECTED NOT DETECTED Final   Haemophilus influenzae NOT DETECTED NOT DETECTED Final   Neisseria meningitidis NOT DETECTED NOT DETECTED Final   Pseudomonas aeruginosa NOT DETECTED NOT DETECTED Final   Stenotrophomonas maltophilia NOT DETECTED NOT DETECTED Final   Candida albicans NOT DETECTED NOT DETECTED Final   Candida auris NOT DETECTED NOT DETECTED Final   Candida glabrata NOT DETECTED NOT DETECTED Final   Candida krusei NOT DETECTED NOT DETECTED Final   Candida parapsilosis NOT DETECTED NOT DETECTED Final   Candida tropicalis NOT DETECTED NOT DETECTED Final   Cryptococcus neoformans/gattii NOT DETECTED NOT DETECTED  Final   CTX-M ESBL NOT DETECTED NOT DETECTED Final   Carbapenem resistance IMP NOT DETECTED NOT DETECTED Final   Carbapenem resistance KPC NOT DETECTED NOT DETECTED Final   Carbapenem resistance NDM NOT DETECTED NOT DETECTED Final   Carbapenem resist OXA 48 LIKE NOT DETECTED NOT DETECTED Final   Carbapenem resistance VIM NOT DETECTED NOT DETECTED Final    Comment: Performed at [facility], [address], [place], [place] [address]  Resp Panel by RT-PCR (Flu A&B, Covid) Anterior Nasal Swab     Status: None   Collection Time: 02/10/22  4:48 PM   Specimen: Anterior Nasal Swab  Result Value Ref Range Status   SARS Coronavirus 2 by RT PCR NEGATIVE NEGATIVE Final    Comment: (NOTE) SARS-CoV-2 target nucleic acids are NOT DETECTED.  The SARS-CoV-2 RNA is generally detectable in upper respiratory specimens during the acute phase of infection. The lowest concentration of SARS-CoV-2 viral copies this assay can detect is 138 copies/mL. A negative result does not preclude SARS-Cov-2 infection and should not be used as the sole basis for treatment or other patient management decisions. A negative result may occur with  improper specimen collection/handling, submission of specimen other than nasopharyngeal swab, presence of viral mutation(s) within the areas targeted by this assay, and inadequate number of viral copies(<138 copies/mL).  A negative result must be combined with clinical observations, patient history, and epidemiological information. The expected result is Negative.  Fact Sheet for Patients:  EntrepreneurPulse.com.au  Fact Sheet for Healthcare Providers:  IncredibleEmployment.be  This test is no t yet approved or cleared by the [place] FDA and  has been authorized for detection and/or diagnosis of SARS-CoV-2 by FDA under an Emergency Use Authorization (EUA). This EUA will remain  in effect (meaning this test can be used) for the  duration of the COVID-19 declaration under Section 564(b)(1) of the Act, 21 U.S.C.section 360bbb-3(b)(1), unless the authorization is terminated  or revoked sooner.       Influenza A by PCR NEGATIVE NEGATIVE Final   Influenza B by PCR NEGATIVE NEGATIVE Final    Comment: (NOTE) The Xpert Xpress SARS-CoV-2/FLU/RSV plus assay is intended as an aid in the diagnosis of influenza from Nasopharyngeal swab specimens and should not be used as a sole basis for treatment. Nasal washings and aspirates are unacceptable for Xpert Xpress SARS-CoV-2/FLU/RSV testing.  Fact Sheet for Patients: EntrepreneurPulse.com.au  Fact Sheet for Healthcare Providers: IncredibleEmployment.be  This test is not yet approved or cleared by the [place] FDA and has been authorized for detection and/or diagnosis of SARS-CoV-2 by FDA under an Emergency Use Authorization (EUA). This EUA will remain in effect (meaning this test can be used) for the duration of the COVID-19 declaration under Section 564(b)(1) of the Act, 21 U.S.C. section 360bbb-3(b)(1), unless the authorization is terminated or revoked.  Performed at [facility], [address] [place], [place] [address]   Urine Culture     Status: Abnormal   Collection Time: 02/10/22  5:05 PM   Specimen: Urine, Clean Catch  Result Value Ref Range Status   Specimen Description   Final    URINE, CLEAN CATCH Performed at [facility], [address], [place], [place] [address]    Special Requests   Final    NONE Performed at [facility], [address], [place], [place] [address]    Culture (A)  Final    >=100,000 COLONIES/mL ESCHERICHIA COLI 30,000 COLONIES/mL VIRIDANS STREPTOCOCCUS Standardized susceptibility testing for this organism is not available. Performed at [facility], [place] [address], [place], [place] [address]    Report Status 02/13/2022 FINAL  Final   Organism ID, Bacteria ESCHERICHIA COLI (A)   Final      Susceptibility   Escherichia coli - MIC*    AMPICILLIN <=2 SENSITIVE Sensitive     CEFAZOLIN <=4 SENSITIVE Sensitive     CEFEPIME <=0.12 SENSITIVE Sensitive     CEFTRIAXONE <=0.25 SENSITIVE Sensitive     CIPROFLOXACIN <=0.25 SENSITIVE Sensitive     GENTAMICIN >=16 RESISTANT Resistant     IMIPENEM <=0.25 SENSITIVE Sensitive     NITROFURANTOIN <=16 SENSITIVE Sensitive     TRIMETH/SULFA <=20 SENSITIVE Sensitive     AMPICILLIN/SULBACTAM <=2 SENSITIVE Sensitive     PIP/TAZO <=4 SENSITIVE Sensitive     * >=100,000 COLONIES/mL ESCHERICHIA COLI  Aerobic Culture w Gram Stain (superficial specimen)     Status: None   Collection Time: 02/10/22  7:20 PM   Specimen: Abscess  Result Value Ref Range Status   Specimen Description   Final    ABSCESS Performed at [facility], [address], [place], [place] [address]    Special Requests   Final    Normal Performed at [facility], [address], [place], [place] [address]    Gram Stain   Final  NO WBC SEEN RARE GRAM NEGATIVE RODS RARE GRAM POSITIVE RODS Performed at [facility], [address], [place], [place] [address]    Culture FEW ESCHERICHIA COLI  Final   Report Status 02/13/2022 FINAL  Final   Organism ID, Bacteria ESCHERICHIA COLI  Final      Susceptibility   Escherichia coli - MIC*    AMPICILLIN <=2 SENSITIVE Sensitive     CEFAZOLIN <=4 SENSITIVE Sensitive     CEFEPIME <=0.12 SENSITIVE Sensitive     CEFTAZIDIME <=1 SENSITIVE Sensitive     CEFTRIAXONE <=0.25 SENSITIVE Sensitive     CIPROFLOXACIN <=0.25 SENSITIVE Sensitive     GENTAMICIN <=1 SENSITIVE Sensitive     IMIPENEM <=0.25 SENSITIVE Sensitive     TRIMETH/SULFA <=20 SENSITIVE Sensitive     AMPICILLIN/SULBACTAM <=2 SENSITIVE Sensitive     PIP/TAZO <=4 SENSITIVE Sensitive     * FEW ESCHERICHIA COLI  Surgical pcr screen     Status: None   Collection Time: 02/11/22  6:11 PM  Result Value Ref Range Status   MRSA, PCR NEGATIVE NEGATIVE Final    Staphylococcus aureus NEGATIVE NEGATIVE Final    Comment: (NOTE) The Xpert SA Assay (FDA approved for NASAL specimens in patients 22 years of age and older), is one component of a comprehensive surveillance program. It is not intended to diagnose infection nor to guide or monitor treatment. Performed at [facility], [address], [place], [place] [address]   Aerobic/Anaerobic Culture w Gram Stain (surgical/deep wound)     Status: None   Collection Time: 02/12/22 11:41 AM   Specimen: Abscess  Result Value Ref Range Status   Specimen Description ABSCESS  Final   Special Requests RIGHT SHOULDER  Final   Gram Stain   Final    FEW WBC PRESENT, PREDOMINANTLY PMN NO ORGANISMS SEEN    Culture   Final    RARE ESCHERICHIA COLI NO ANAEROBES ISOLATED Performed at [facility], [address] [place], [place] [address]    Report Status 02/17/2022 FINAL  Final   Organism ID, Bacteria ESCHERICHIA COLI  Final      Susceptibility   Escherichia coli - MIC*    AMPICILLIN <=2 SENSITIVE Sensitive     CEFAZOLIN <=4 SENSITIVE Sensitive     CEFEPIME <=0.12 SENSITIVE Sensitive     CEFTAZIDIME <=1 SENSITIVE Sensitive     CEFTRIAXONE <=0.25 SENSITIVE Sensitive     CIPROFLOXACIN <=0.25 SENSITIVE Sensitive     GENTAMICIN <=1 SENSITIVE Sensitive     IMIPENEM <=0.25 SENSITIVE Sensitive     TRIMETH/SULFA <=20 SENSITIVE Sensitive     AMPICILLIN/SULBACTAM <=2 SENSITIVE Sensitive     PIP/TAZO <=4 SENSITIVE Sensitive     * RARE ESCHERICHIA COLI  Culture, blood (Routine X 2) w Reflex to ID Panel     Status: None   Collection Time: 02/12/22  4:41 PM   Specimen: BLOOD  Result Value Ref Range Status   Specimen Description BLOOD SITE NOT SPECIFIED  Final   Special Requests   Final    BOTTLES DRAWN AEROBIC AND ANAEROBIC Blood Culture adequate volume   Culture   Final    NO GROWTH 5 DAYS Performed at [facility], [address], [place], [place] [address]    Report Status 02/17/2022  FINAL  Final  Culture, blood (Routine X 2) w Reflex to ID Panel     Status: None   Collection Time: 02/12/22  5:03 PM   Specimen: BLOOD  Result Value Ref Range Status   Specimen Description BLOOD BLOOD LEFT HAND  Final   Special Requests   Final    BOTTLES DRAWN AEROBIC AND ANAEROBIC Blood Culture adequate volume   Culture   Final    NO GROWTH 5 DAYS Performed at [facility], [address] [place],  [address]    Report Status 02/17/2022 FINAL  Final     Radiology Studies: No results found.  Scheduled Meds:  acetaminophen  650 mg Oral Q6H   ALPRAZolam  0.5 mg Oral TID   Chlorhexidine Gluconate Cloth  6 each Topical Daily   docusate sodium  100 mg Oral BID   escitalopram  20 mg Oral Daily   folic acid  1 mg Oral Daily   heparin  5,000 Units Subcutaneous Q8H   lidocaine  1 patch Transdermal Q24H   sodium chloride flush  10-40 mL Intracatheter Q12H   Continuous Infusions:  sodium chloride 10 mL/hr at 02/14/22 1800   cefTRIAXone (ROCEPHIN)  IV 2 g (02/19/22 1712)   methocarbamol (ROBAXIN) IV       LOS: 10 days   Shelly Coss, MD [facility] P7/01/2022, 11:36 AM

## 2022-02-20 NOTE — Anesthesia Preprocedure Evaluation (Addendum)
Anesthesia Evaluation  Patient identified by MRN, date of birth, ID band Patient awake    Reviewed: Allergy & Precautions, NPO status , Patient's Chart, lab work & pertinent test results  Airway Mallampati: II  TM Distance: >3 FB Neck ROM: Full    Dental no notable dental hx. (+) Edentulous Upper,    Pulmonary sleep apnea , former smoker,    Pulmonary exam normal breath sounds clear to auscultation       Cardiovascular hypertension, + CAD and + Orthopnea  Normal cardiovascular exam Rhythm:Regular Rate:Normal     Neuro/Psych    GI/Hepatic (+)     substance abuse  ,   Endo/Other  diabetesMorbid obesity (BMI 46.24)  Renal/GU Renal InsufficiencyRenal disease     Musculoskeletal   Abdominal   Peds  Hematology   Anesthesia Other Findings   Reproductive/Obstetrics                            Anesthesia Physical Anesthesia Plan  ASA: 3  Anesthesia Plan: MAC   Post-op Pain Management:    Induction: Intravenous  PONV Risk Score and Plan: 2 and Treatment may vary due to age or medical condition and Ondansetron  Airway Management Planned: Natural Airway and Nasal Cannula  Additional Equipment:   Intra-op Plan:   Post-operative Plan:   Informed Consent: I have reviewed the patients History and Physical, chart, labs and discussed the procedure including the risks, benefits and alternatives for the proposed anesthesia with the patient or authorized representative who has indicated his/her understanding and acceptance.     Dental advisory given  Plan Discussed with:   Anesthesia Plan Comments:        Anesthesia Quick Evaluation

## 2022-02-21 ENCOUNTER — Encounter (HOSPITAL_COMMUNITY): Payer: Self-pay | Admitting: Internal Medicine

## 2022-02-21 ENCOUNTER — Inpatient Hospital Stay (HOSPITAL_COMMUNITY): Payer: Medicare HMO | Admitting: Anesthesiology

## 2022-02-21 ENCOUNTER — Inpatient Hospital Stay (HOSPITAL_COMMUNITY): Payer: Medicare HMO

## 2022-02-21 ENCOUNTER — Encounter (HOSPITAL_COMMUNITY)
Admission: EM | Disposition: A | Discharge status: Home Health | Payer: Self-pay | Source: Other Acute Inpatient Hospital | Attending: Internal Medicine

## 2022-02-21 DIAGNOSIS — R7881 Bacteremia: Secondary | ICD-10-CM

## 2022-02-21 DIAGNOSIS — L02413 Cutaneous abscess of right upper limb: Secondary | ICD-10-CM | POA: Diagnosis not present

## 2022-02-21 DIAGNOSIS — Z87891 Personal history of nicotine dependence: Secondary | ICD-10-CM

## 2022-02-21 DIAGNOSIS — I340 Nonrheumatic mitral (valve) insufficiency: Secondary | ICD-10-CM

## 2022-02-21 DIAGNOSIS — I350 Nonrheumatic aortic (valve) stenosis: Secondary | ICD-10-CM

## 2022-02-21 DIAGNOSIS — I081 Rheumatic disorders of both mitral and tricuspid valves: Secondary | ICD-10-CM

## 2022-02-21 DIAGNOSIS — I2510 Atherosclerotic heart disease of native coronary artery without angina pectoris: Secondary | ICD-10-CM

## 2022-02-21 DIAGNOSIS — I10 Essential (primary) hypertension: Secondary | ICD-10-CM

## 2022-02-21 HISTORY — PX: TEE WITHOUT CARDIOVERSION: SHX5443

## 2022-02-21 LAB — GLUCOSE, CAPILLARY
Glucose-Capillary: 116 mg/dL — ABNORMAL HIGH (ref 70–99)
Glucose-Capillary: 117 mg/dL — ABNORMAL HIGH (ref 70–99)
Glucose-Capillary: 120 mg/dL — ABNORMAL HIGH (ref 70–99)
Glucose-Capillary: 141 mg/dL — ABNORMAL HIGH (ref 70–99)

## 2022-02-21 LAB — CBC
HCT: 26.3 % — ABNORMAL LOW (ref 36.0–46.0)
Hemoglobin: 8.1 g/dL — ABNORMAL LOW (ref 12.0–15.0)
MCH: 29.7 pg (ref 26.0–34.0)
MCHC: 30.8 g/dL (ref 30.0–36.0)
MCV: 96.3 fL (ref 80.0–100.0)
Platelets: 334 10*3/uL (ref 150–400)
RBC: 2.73 MIL/uL — ABNORMAL LOW (ref 3.87–5.11)
RDW: 15.3 % (ref 11.5–15.5)
WBC: 14.3 10*3/uL — ABNORMAL HIGH (ref 4.0–10.5)
nRBC: 0 % (ref 0.0–0.2)

## 2022-02-21 LAB — BASIC METABOLIC PANEL
Anion gap: 15 (ref 5–15)
BUN: 22 mg/dL (ref 8–23)
CO2: 26 mmol/L (ref 22–32)
Calcium: 8.3 mg/dL — ABNORMAL LOW (ref 8.9–10.3)
Chloride: 97 mmol/L — ABNORMAL LOW (ref 98–111)
Creatinine, Ser: 1.23 mg/dL — ABNORMAL HIGH (ref 0.44–1.00)
GFR, Estimated: 48 mL/min — ABNORMAL LOW (ref >60)
Glucose, Bld: 118 mg/dL — ABNORMAL HIGH (ref 70–99)
Potassium: 3.4 mmol/L — ABNORMAL LOW (ref 3.5–5.1)
Sodium: 138 mmol/L (ref 135–145)

## 2022-02-21 SURGERY — ECHOCARDIOGRAM, TRANSESOPHAGEAL
Anesthesia: Monitor Anesthesia Care

## 2022-02-21 MED ORDER — MELOXICAM 15 MG PO TABS: 15.0000 mg | ORAL_TABLET | Freq: Two times a day (BID) | ORAL | Status: AC | PRN

## 2022-02-21 MED ORDER — ALPRAZOLAM 0.5 MG PO TABS: 0.5000 mg | ORAL_TABLET | Freq: Three times a day (TID) | ORAL | 0 refills | Status: DC

## 2022-02-21 MED ORDER — LIDOCAINE 2% (20 MG/ML) 5 ML SYRINGE
INTRAMUSCULAR | Status: DC | PRN
  Administered 2022-02-21: 100 mg via INTRAVENOUS

## 2022-02-21 MED ORDER — PHENYLEPHRINE 80 MCG/ML (10ML) SYRINGE FOR IV PUSH (FOR BLOOD PRESSURE SUPPORT)
PREFILLED_SYRINGE | INTRAVENOUS | Status: DC | PRN
  Administered 2022-02-21: 80 ug via INTRAVENOUS

## 2022-02-21 MED ORDER — TRAMADOL HCL 50 MG PO TABS: 50.0000 mg | ORAL_TABLET | Freq: Four times a day (QID) | ORAL | 0 refills | Status: DC | PRN

## 2022-02-21 MED ORDER — TRAMADOL HCL 50 MG PO TABS
50.0000 mg | ORAL_TABLET | Freq: Four times a day (QID) | ORAL | 0 refills | Status: DC | PRN
Start: 2022-02-21 — End: 2022-02-21

## 2022-02-21 MED ORDER — POTASSIUM CHLORIDE CRYS ER 20 MEQ PO TBCR: 40.0000 meq | EXTENDED_RELEASE_TABLET | Freq: Once | ORAL | Status: DC

## 2022-02-21 MED ORDER — SODIUM CHLORIDE 0.9 % IV SOLN: INTRAVENOUS | Status: DC

## 2022-02-21 MED ORDER — PROPOFOL 500 MG/50ML IV EMUL
INTRAVENOUS | Status: DC | PRN
  Administered 2022-02-21: 100 ug/kg/min via INTRAVENOUS

## 2022-02-21 MED ORDER — HEPARIN SOD (PORK) LOCK FLUSH 100 UNIT/ML IV SOLN
250.0000 [IU] | INTRAVENOUS | Status: AC | PRN
  Administered 2022-02-21: 250 [IU]

## 2022-02-21 MED ORDER — POTASSIUM CHLORIDE 10 MEQ/100ML IV SOLN: 10.0000 meq | INTRAVENOUS | Status: DC

## 2022-02-21 MED ORDER — POTASSIUM CHLORIDE CRYS ER 20 MEQ PO TBCR
40.0000 meq | EXTENDED_RELEASE_TABLET | Freq: Once | ORAL | Status: AC
  Administered 2022-02-21: 40 meq via ORAL
  Filled 2022-02-21: qty 2

## 2022-02-21 MED ORDER — CEFTRIAXONE IV (FOR PTA / DISCHARGE USE ONLY): 2.0000 g | INTRAVENOUS | 0 refills | Status: AC

## 2022-02-21 MED ORDER — PROPOFOL 10 MG/ML IV BOLUS
INTRAVENOUS | Status: DC | PRN
  Administered 2022-02-21 (×3): 20 mg via INTRAVENOUS

## 2022-02-21 NOTE — Anesthesia Procedure Notes (Signed)
Procedure Name: MAC Date/Time: 02/21/2022 11:52 AM  Performed by: Dorann Lodge, CRNAPre-anesthesia Checklist: Patient identified, Emergency Drugs available, Suction available and Patient being monitored Patient Re-evaluated:Patient Re-evaluated prior to induction Oxygen Delivery Method: Nasal cannula Airway Equipment and Method: Bite block Dental Injury: Teeth and Oropharynx as per pre-operative assessment

## 2022-02-21 NOTE — CV Procedure (Signed)
TRANSESOPHAGEAL ECHOCARDIOGRAM (TEE) NOTE  INDICATIONS: infective endocarditis  PROCEDURE:   Informed consent was obtained prior to the procedure. The risks, benefits and alternatives for the procedure were discussed and the patient comprehended these risks.  Risks include, but are not limited to, cough, sore throat, vomiting, nausea, somnolence, esophageal and stomach trauma or perforation, bleeding, low blood pressure, aspiration, pneumonia, infection, trauma to the teeth and death.    After a procedural time-out, the patient was given propofol for sedation by anesthesia. See their separate report.  The patient's heart rate, blood pressure, and oxygen saturation are monitored continuously during the procedure.The oropharynx was anesthetized with topical cetacaine.  The transesophageal probe was inserted in the esophagus and stomach without difficulty and multiple views were obtained.  The patient was kept under observation until the patient left the procedure room.  I was present face-to-face 100% of this time. The patient left the procedure room in stable condition.   Agitated microbubble saline contrast was not administered.  COMPLICATIONS:    There were no immediate complications.  Findings:  LEFT VENTRICLE: The left ventricular wall thickness is normal.  The left ventricular cavity is normal in size. Wall motion is normal.  LVEF is 55-60%.  RIGHT VENTRICLE:  The right ventricle is normal in structure and function without any thrombus or masses.    LEFT ATRIUM:  The left atrium is normal in size without any thrombus or masses.  There is not spontaneous echo contrast ("smoke") in the left atrium consistent with a low flow state.  LEFT ATRIAL APPENDAGE:  The left atrial appendage is free of any thrombus or masses. The appendage has single lobes. Pulse doppler indicates moderate flow in the appendage.  ATRIAL SEPTUM:  The atrial septum is aneurysmal, but there is no evidence for  interatrial shunting by color doppler.  RIGHT ATRIUM:  The right atrium is normal in size and function without any thrombus or masses.  MITRAL VALVE:  The mitral valve is degenerate with Mild regurgitation, multiple jets.  There were no vegetations or stenosis.  AORTIC VALVE:  The aortic valve is mildly sclerotic with  no  regurgitation.  There is possible mild stenosis, however, the valve gradients could not be obtained.  TRICUSPID VALVE:  The tricuspid valve is normal in structure and function with Mild regurgitation.  There were no vegetations or stenosis   PULMONIC VALVE:  The pulmonic valve is normal in structure and function with  trivial  regurgitation.  There were no vegetations or stenosis.   AORTIC ARCH, ASCENDING AND DESCENDING AORTA:  There was no Myrtis Ser et. Al, 1992) atherosclerosis of the ascending aorta, aortic arch, or proximal descending aorta.  12. PULMONARY VEINS: Anomalous pulmonary venous return was not noted.  13. PERICARDIUM: The pericardium appeared normal and non-thickened.  There is no pericardial effusion.  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  Time Spent Directly with the Patient:  45 minutes   [name], MD, [facility], FACP  [place]  [facility]  Medical Director of the [facility] Diplomate of the American Board of Clinical Lipidology Attending Cardiologist  Direct Dial: [phone]  Fax: [phone]  Website:  [email or web address] Jerlean Peralta 02/21/2022, 12:09 PM

## 2022-02-21 NOTE — Interval H&P Note (Signed)
History and Physical Interval Note:  02/21/2022 11:37 AM  Crystal Moses  has presented today for surgery, with the diagnosis of bacteremia.  The various methods of treatment have been discussed with the patient and family. After consideration of risks, benefits and other options for treatment, the patient has consented to  Procedure(s): TRANSESOPHAGEAL ECHOCARDIOGRAM (TEE) (N/A) as a surgical intervention.  The patient's history has been reviewed, patient examined, no change in status, stable for surgery.  I have reviewed the patient's chart and labs.  Questions were answered to the patient's satisfaction.     [name]

## 2022-02-21 NOTE — Care Management Important Message (Signed)
Important Message  Patient Details  Name: Crystal Moses MRN: [id number] Date of Birth: [birth year]   Medicare Important Message Given:  Yes     Renie Ora 02/21/2022, 9:48 AM

## 2022-02-21 NOTE — Progress Notes (Signed)
Physical Therapy Treatment Patient Details Name: Crystal Moses MRN: [id number] DOB: 02-Apr-1956 Today's Date: 02/21/2022   History of Present Illness 66 y.o. female presents to [facility] hospital on 02/10/2022 with AMS, weakness and R shoulder pain. Pt admitted for management of sepsis with abscess of R shoulder. Pt underwent R shoulder debridement with resection of humeral head on 02/12/2022, repeat debridement on 6/30. PMH includes CAD, CHF, DM, HLD, HTN.    PT Comments    Pt continues to have very limited mobility. Today attempted to stand from bed but unable with 1 person assist. Pt with frequent loose stools limiting any further attempts.    Recommendations for follow up therapy are one component of a multi-disciplinary discharge planning process, led by the attending physician.  Recommendations may be updated based on patient status, additional functional criteria and insurance authorization.  Follow Up Recommendations  Skilled nursing-short term rehab (<3 hours/day) Can patient physically be transported by private vehicle: No   Assistance Recommended at Discharge Intermittent Supervision/Assistance  Patient can return home with the following Two people to help with walking and/or transfers;Two people to help with bathing/dressing/bathroom;Assistance with cooking/housework;Assistance with feeding;Direct supervision/assist for medications management;Direct supervision/assist for financial management;Assist for transportation;Help with stairs or ramp for entrance   Equipment Recommendations   (defer to post-acute setting)    Recommendations for Other Services       Precautions / Restrictions Precautions Precautions: Fall Required Braces or Orthoses: Sling Restrictions Weight Bearing Restrictions: Yes RUE Weight Bearing: Non weight bearing Other Position/Activity Restrictions: no ROM restrictions to RUE per Dr. Lajoyce Corners, pt shoulder mobility will be limited due to humeral head resection      Mobility  Bed Mobility Overal bed mobility: Needs Assistance Bed Mobility: Supine to Sit, Sit to Supine, Rolling Rolling: Mod assist   Supine to sit: Mod assist, HOB elevated Sit to supine: Max assist   General bed mobility comments: Rolls toward lt with mod assist. Unable to roll toward rt with 1 person assist due to rt shoulder. Assist to bring legs off of bed, elevate trunk into sitting and bring hips to EOB. Assist to bring legs back up into bed    Transfers                   General transfer comment: Attempted to stand x 1 from EOB holding onto back of small chair with LUE. Pt unable to rise with 1 person assist. Pt then with urgent need for BM so returned to supine.    Ambulation/Gait               General Gait Details: unable   Stairs             Wheelchair Mobility    Modified Rankin (Stroke Patients Only)       Balance Overall balance assessment: Needs assistance Sitting-balance support: No upper extremity supported, Feet supported Sitting balance-Leahy Scale: Fair Sitting balance - Comments: Sat EOB x 10 minutes with supervision                                    Cognition Arousal/Alertness: Awake/alert Behavior During Therapy: Anxious Overall Cognitive Status: No family/caregiver present to determine baseline cognitive functioning                                 General Comments: Pt tearful  and anxious. Frustrated with frequent stools and sore peri area. Followed 1 step commands        Exercises      General Comments        Pertinent Vitals/Pain Pain Assessment Pain Assessment: Faces Faces Pain Scale: Hurts even more Pain Location: bottom Pain Descriptors / Indicators: Grimacing, Moaning, Sore Pain Intervention(s): Monitored during session, Limited activity within patient's tolerance, Repositioned    Home Living                          Prior Function            PT Goals  (current goals can now be found in the care plan section) Acute Rehab PT Goals Patient Stated Goal: to reduce pain Progress towards PT goals: Not progressing toward goals - comment    Frequency    Min 2X/week      PT Plan Current plan remains appropriate    Co-evaluation              AM-PAC PT "6 Clicks" Mobility   Outcome Measure  Help needed turning from your back to your side while in a flat bed without using bedrails?: Total Help needed moving from lying on your back to sitting on the side of a flat bed without using bedrails?: Total Help needed moving to and from a bed to a chair (including a wheelchair)?: Total Help needed standing up from a chair using your arms (e.g., wheelchair or bedside chair)?: Total Help needed to walk in hospital room?: Total Help needed climbing 3-5 steps with a railing? : Total 6 Click Score: 6    End of Session Equipment Utilized During Treatment: Other (comment) (sling) Activity Tolerance: Other (comment) (Limited by frequent stools) Patient left: in bed;with call bell/phone within reach   PT Visit Diagnosis: Other abnormalities of gait and mobility (R26.89);Muscle weakness (generalized) (M62.81);Pain Pain - Right/Left: Right Pain - part of body: Shoulder     Time: 0626-9485 PT Time Calculation (min) (ACUTE ONLY): 35 min  Charges:  $Therapeutic Activity: 23-37 mins                     [facility] PT Acute Rehabilitation Services Office [phone]    Angelina Ok [facility] 02/21/2022, 11:05 AM

## 2022-02-21 NOTE — Transfer of Care (Signed)
Immediate Anesthesia Transfer of Care Note  Patient: Crystal Moses  Procedure(s) Performed: TRANSESOPHAGEAL ECHOCARDIOGRAM (TEE)  Patient Location: Endoscopy Unit  Anesthesia Type:MAC  Level of Consciousness: awake and drowsy  Airway & Oxygen Therapy: Patient Spontanous Breathing and Patient connected to nasal cannula oxygen  Post-op Assessment: Report given to RN and Post -op Vital signs reviewed and stable  Post vital signs: Reviewed and stable  Last Vitals:  Vitals Value Taken Time  BP    Temp    Pulse 100 02/21/22 1212  Resp 23 02/21/22 1212  SpO2 95 % 02/21/22 1212  Vitals shown include unvalidated device data.  Last Pain:  Vitals:   02/21/22 1130  TempSrc: Temporal  PainSc: 0-No pain      Patients Stated Pain Goal: 2 (97/02/63 7858)  Complications: No notable events documented.

## 2022-02-21 NOTE — Progress Notes (Signed)
Report given to [facility]. All questions were answered.

## 2022-02-21 NOTE — Discharge Summary (Signed)
Physician Discharge Summary  Crystal Moses HUD:[id number] DOB: 1956-06-04 DOA: 02/10/2022  PCP: Caprice Renshaw, MD  Admit date: 02/10/2022 Discharge date: 02/21/2022  Admitted From: Home Disposition:  Home  Discharge Condition:Stable CODE STATUS:FULL Diet recommendation: Heart Healthy   Brief/Interim Summary: Patient is a 66 year old female with history of consider heart failure, hypertension who presented from SNF with altered mental status, weakness, right shoulder pain.  History of local trauma about a month ago on right shoulder.  Patient was found to have septic arthritis of right shoulder.  Orthopedics consulted.  Blood cultures showed E. coli.  ID also consulted. 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  On wound VAC. TEE did not show any endocarditis.  Plan is to continue ceftriaxone for total of 6 weeks, last day on 03/28/2022.  Medically stable for discharge back to skilled nursing facility today.  Following problems were addressed during her hospitalization:  Right shoulder septic arthritis: Orthopedics were following.  Status post debridement.  Currently on wound VAC which will be continued for total of 7 days.  She will follow-up with orthopedics as an outpatient within a week.   E. coli bacteremia: Found to have bacteremia and UTI secondary to E. coli.  Still has mild leukocytosis.  Currently afebrile.  Last blood cultures no growth till date.  ID was following and recommended to continue ceftriaxone for total of 6 weeks, last dose on 8/11.  TEE negative for endocarditis.   Acute metabolic encephalopathy Multifactorial.  EEG negative for seizures.  Mental status has improved.  Continue delirium precautions.  Continues to cry intermittently and says he wants to go home.   AKI on CKD stage 2: Resolved.  On metolazone and torsemide at skilled nursing facility..  Baseline creatinine around 1.1.  Currently kidney function close to baseline.  Metolazone will  be discontinued on discharge   Diastolic congestive heart failure: Currently euvolemic.  Echo done on this admission showed EF of 85%, grade 1 diastolic dysfunction.   Hypertension: Currently blood pressure stable.  Continue current medications   Morbid obesity: BMI 48.      Discharge Diagnoses:  Active Problems:   Abscess of right shoulder   Acute metabolic encephalopathy   AKI (acute kidney injury) ([place])   CHF (congestive heart failure) ([place])   DM (diabetes mellitus) ([place])   HTN (hypertension)   Class 3 obesity [facility])    Discharge Instructions  Discharge Instructions     [facility] pharmacist to adjust dose for Vancomycin, Aminoglycosides and other anti-infective therapies as requested by physician.   Complete by: As directed    [facility] to provide Cath Flo 2mg    Complete by: As directed    Administer for PICC line occlusion and as ordered by physician for other access device issues.   Anaphylaxis Kit: Provided to treat any anaphylactic reaction to the medication being provided to the patient if First Dose or when requested by physician   Complete by: As directed    Epinephrine 1mg /ml vial / amp: Administer 0.3mg  (0.57ml) subcutaneously once for moderate to severe anaphylaxis, nurse to call physician and pharmacy when reaction occurs and call 911 if needed for immediate care   Diphenhydramine 50mg /ml IV vial: Administer 25-50mg  IV/IM PRN for first dose reaction, rash, itching, mild reaction, nurse to call physician and pharmacy when reaction occurs   Sodium Chloride 0.9% NS 531ml IV: Administer if needed for hypovolemic blood pressure drop or as ordered by physician after call to physician with  anaphylactic reaction   Change dressing on IV access line weekly and PRN   Complete by: As directed    Diet - low sodium heart healthy   Complete by: As directed    Discharge instructions   Complete by: As directed    1)Please take prescribed medications as  instructed 2)Follow up with orthopedics as an outpatient within a week .  Name and number of the provider group has been attached 3)Follow up with infectious disease on 03/10/2022 4)Do a CBC and BMP test in a week.   Flush IV access with Sodium Chloride 0.9% and Heparin 10 units/ml or 100 units/ml   Complete by: As directed    Home infusion instructions - [facility]   Complete by: As directed    Instructions: Flush IV access with Sodium Chloride 0.9% and Heparin 10units/ml or 100units/ml   Change dressing on IV access line: Weekly and PRN   Instructions Cath Flo 54m: Administer for PICC Line occlusion and as ordered by physician for other access device   [facility] pharmacist to adjust dose for: Vancomycin, Aminoglycosides and other anti-infective therapies as requested by physician   Increase activity slowly   Complete by: As directed    Method of administration may be changed at the discretion of home infusion pharmacist based upon assessment of the patient and/or caregiver's ability to self-administer the medication ordered   Complete by: As directed    No wound care   Complete by: As directed       Allergies as of 02/21/2022   No Known Allergies      Medication List     STOP taking these medications    metolazone 5 MG tablet Commonly known as: ZAROXOLYN       TAKE these medications    acetaminophen 500 MG tablet Commonly known as: TYLENOL Take 1,000 mg by mouth every 12 (twelve) hours as needed for fever or moderate pain. >100.1. Not to exceed 3 grams in 24 hours. What changed: Another medication with the same name was removed. Continue taking this medication, and follow the directions you see here.   ALPRAZolam 0.5 MG tablet Commonly known as: XANAX Take 1 tablet (0.5 mg total) by mouth in the morning, at noon, and at bedtime.   atorvastatin 80 MG tablet Commonly known as: LIPITOR Take 80 mg by mouth at bedtime.   cefTRIAXone   IVPB Commonly known as: ROCEPHIN Inject 2 g into the vein daily. Indication:  E.coli R-shoulder abscess/osteo First Dose: Yes Last Day of Therapy:  03/28/22 Labs - Once weekly:  CBC/D and BMP, Labs - Every other week:  ESR and CRP Method of administration: IV Push Pull PICC at the completion of IV therapy Method of administration may be changed at the discretion of home infusion pharmacist based upon assessment of the patient and/or caregiver's ability to self-administer the medication ordered.   diclofenac Sodium 1 % Gel Commonly known as: VOLTAREN Apply 1 Application topically 4 (four) times daily.   divalproex 125 MG capsule Commonly known as: DEPAKOTE SPRINKLE Take 250 mg by mouth 2 (two) times daily.   escitalopram 20 MG tablet Commonly known as: LEXAPRO Take 20 mg by mouth daily.   folic acid 1 MG tablet Commonly known as: FOLVITE Take 1 mg by mouth daily.   gabapentin 300 MG capsule Commonly known as: NEURONTIN Take 300-600 mg by mouth as directed. Take 1 capsule (300 mg) BID & Take 2 capsules (600 mg) at bedtime  ipratropium-albuterol 0.5-2.5 (3) MG/3ML Soln Commonly known as: DUONEB Inhale 3 mLs into the lungs every 6 (six) hours as needed (sob/wheezing).   lidocaine 5 % Commonly known as: LIDODERM Place 1 patch onto the skin daily. Remove & Discard patch within 12 hours or as directed by MD   loperamide 2 MG tablet Commonly known as: IMODIUM A-D Take 2 mg by mouth daily as needed for diarrhea or loose stools.   magnesium oxide 400 MG tablet Commonly known as: MAG-OX Take 400 mg by mouth daily.   melatonin 3 MG Tabs tablet Take 6 mg by mouth at bedtime.   meloxicam 15 MG tablet Commonly known as: MOBIC Take 1 tablet (15 mg total) by mouth 2 (two) times daily as needed for pain. What changed:  when to take this reasons to take this   metoprolol succinate 25 MG 24 hr tablet Commonly known as: TOPROL-XL Take 1 tablet by mouth daily.   oxymetazoline  0.05 % nasal spray Commonly known as: AFRIN Place 1 spray into both nostrils 2 (two) times daily.   polyethylene glycol 17 g packet Commonly known as: MIRALAX / GLYCOLAX Take 17 g by mouth daily as needed for moderate constipation.   Potassium Chloride ER 20 MEQ Tbcr Take 1 tablet by mouth daily.   sodium chloride 0.65 % nasal spray Commonly known as: OCEAN Place 1 spray into the nose every 2 (two) hours as needed for congestion.   torsemide 20 MG tablet Commonly known as: DEMADEX Take 40 mg by mouth in the morning.   traMADol 50 MG tablet Commonly known as: ULTRAM Take 1 tablet (50 mg total) by mouth every 6 (six) hours as needed for moderate pain.   Tums 500 MG chewable tablet Generic drug: calcium carbonate Chew 1 tablet by mouth every 8 (eight) hours as needed for heartburn.               Discharge Care Instructions  (From admission, onward)           Start     Ordered   02/21/22 0000  Change dressing on IV access line weekly and PRN  (Home infusion instructions - [facility] )        02/21/22 1258            Follow-up Information     Newt Minion, MD Follow up in 1 week(s).   Specialty: Orthopedic Surgery Contact information: [place] [address] [phone]         [facility] Follow up.   Specialty: Infectious Diseases Why: Has appointment on 03/10/22 at 10:45 am Contact information: [address] [phone] [place] [address] [phone]               No Known Allergies  Consultations: ID, orthopedics   Procedures/Studies: ECHOCARDIOGRAM COMPLETE  Result Date: 02/16/2022    ECHOCARDIOGRAM REPORT   Patient Name:   Crystal Moses Date of Exam: 02/16/2022 Medical Rec #:  [id number]    Height:       61.0 in Accession #:    [id number]   Weight:       256.6 lb Date of Birth:  06/30/1956    BSA:          2.100 m Patient Age:    83 years     BP:           107/68 mmHg  Patient Gender: F  HR:           93 bpm. Exam Location:  Inpatient Procedure: 2D Echo Indications:    Bacteremia  History:        Patient has prior history of Echocardiogram examinations, most                 recent 11/11/2021. CHF; Risk Factors:Diabetes and Hypertension.  Sonographer:    Johny Chess RDCS Referring Phys: [id number] GREGORY D CALONE  Sonographer Comments: Patient is morbidly obese. Patient unable to turn. IMPRESSIONS  1. Left ventricular ejection fraction, by estimation, is 55%. The left ventricle has normal function. The left ventricle has no regional wall motion abnormalities. Left ventricular diastolic parameters are consistent with Grade I diastolic dysfunction (impaired relaxation).  2. Right ventricular systolic function is normal. The right ventricular size is normal. There is normal pulmonary artery systolic pressure.  3. The mitral valve is abnormal. Mild mitral valve regurgitation. Severe mitral annular calcification.  4. The aortic valve is tricuspid. There is moderate calcification of the aortic valve. There is moderate thickening of the aortic valve. Aortic valve regurgitation is not visualized. Mild aortic valve stenosis. FINDINGS  Left Ventricle: Left ventricular ejection fraction, by estimation, is 55%. The left ventricle has normal function. The left ventricle has no regional wall motion abnormalities. The left ventricular internal cavity size was normal in size. There is no left ventricular hypertrophy. Left ventricular diastolic parameters are consistent with Grade I diastolic dysfunction (impaired relaxation). Right Ventricle: The right ventricular size is normal. No increase in right ventricular wall thickness. Right ventricular systolic function is normal. There is normal pulmonary artery systolic pressure. The tricuspid regurgitant velocity is 2.46 m/s, and  with an assumed right atrial pressure of 3 mmHg, the estimated right ventricular systolic pressure is 32.1  mmHg. Left Atrium: Left atrial size was normal in size. Right Atrium: Right atrial size was normal in size. Pericardium: There is no evidence of pericardial effusion. Mitral Valve: The mitral valve is abnormal. There is moderate thickening of the mitral valve leaflet(s). There is moderate calcification of the mitral valve leaflet(s). Severe mitral annular calcification. Mild mitral valve regurgitation. Tricuspid Valve: The tricuspid valve is normal in structure. Tricuspid valve regurgitation is mild. Aortic Valve: The aortic valve is tricuspid. There is moderate calcification of the aortic valve. There is moderate thickening of the aortic valve. Aortic valve regurgitation is not visualized. Mild aortic stenosis is present. Aortic valve mean gradient measures 14.0 mmHg. Aortic valve peak gradient measures 28.8 mmHg. Aortic valve area, by VTI measures 1.73 cm. Pulmonic Valve: The pulmonic valve was not well visualized. Pulmonic valve regurgitation is not visualized. Aorta: The aortic root is normal in size and structure. IAS/Shunts: The interatrial septum was not well visualized.  LEFT VENTRICLE PLAX 2D LVIDd:         4.70 cm   Diastology LVIDs:         2.90 cm   LV e' medial:    5.77 cm/s LV PW:         1.00 cm   LV E/e' medial:  15.8 LV IVS:        0.90 cm   LV e' lateral:   10.10 cm/s LVOT diam:     2.00 cm   LV E/e' lateral: 9.0 LV SV:         67 LV SV Index:   32 LVOT Area:     3.14 cm  RIGHT VENTRICLE  IVC RV S prime:     9.57 cm/s  IVC diam: 1.80 cm TAPSE (M-mode): 1.2 cm LEFT ATRIUM             Index        RIGHT ATRIUM           Index LA diam:        3.50 cm 1.67 cm/m   RA Area:     11.10 cm LA Vol (A2C):   77.1 ml 36.72 ml/m  RA Volume:   22.90 ml  10.91 ml/m LA Vol (A4C):   44.1 ml 21.00 ml/m LA Biplane Vol: 59.0 ml 28.10 ml/m  AORTIC VALVE AV Area (Vmax):    1.49 cm AV Area (Vmean):   1.47 cm AV Area (VTI):     1.73 cm AV Vmax:           268.50 cm/s AV Vmean:          172.500 cm/s AV  VTI:            0.387 m AV Peak Grad:      28.8 mmHg AV Mean Grad:      14.0 mmHg LVOT Vmax:         127.00 cm/s LVOT Vmean:        80.800 cm/s LVOT VTI:          0.214 m LVOT/AV VTI ratio: 0.55  AORTA Ao Root diam: 3.30 cm Ao Asc diam:  3.10 cm MITRAL VALVE                TRICUSPID VALVE MV Area (PHT): 5.38 cm     TR Peak grad:   24.2 mmHg MV Decel Time: 141 msec     TR Vmax:        246.00 cm/s MV E velocity: 91.30 cm/s MV A velocity: 154.00 cm/s  SHUNTS MV E/A ratio:  0.59         Systemic VTI:  0.21 m                             Systemic Diam: 2.00 cm Jenkins Rouge MD Electronically signed by Jenkins Rouge MD Signature Date/Time: 02/16/2022/6:40:41 PM    Final    [place] EKG SITE RITE  Result Date: 02/14/2022 If Site Rite image not attached, placement could not be confirmed due to current cardiac rhythm.  MR SHOULDER RIGHT WO CONTRAST  Result Date: 02/11/2022 CLINICAL DATA:  Soft tissue infection suspected. EXAM: MRI OF THE RIGHT SHOULDER WITHOUT CONTRAST TECHNIQUE: Multiplanar, multisequence MR imaging of the shoulder was performed. No intravenous contrast was administered. COMPARISON:  Radiographs dated February 10, 2022 FINDINGS: Multiple sequences are degraded severely limiting evaluation. Skin/soft tissues: There is subcutaneous soft tissue edema about the lateral aspect of the shoulder. There is likely a fluid collection on the lateral aspect of the shoulder, evaluation is however limited due to motion. Rotator cuff: High-riding humeral head suggesting chronic full-thickness supraspinatus tear. Muscles:  Limited evaluation. Biceps long head:  Limited evaluation. Acromioclavicular Joint:  Moderate acromioclavicular osteoarthritis. Glenohumeral Joint: Small joint effusion and marginal osteophytes. No appreciable fracture. Labrum:  Limited evaluation due to motion. Bones:  No appreciable fracture. IMPRESSION: 1. Subcutaneous soft tissue edema about the lateral aspect of the shoulder with likely a fluid collection,  concerning for infectious/inflammatory process. Evaluation is significantly limited due to motion. Further evaluation with CT examination or ultrasound would be helpful. 2. High-riding humeral head suggesting  chronic rotator cuff arthropathy with possible supraspinatus full-thickness tear. 3.  Glenohumeral and acromioclavicular osteoarthritis. 2.  Subcutaneous soft tissue Electronically Signed   By: Keane Police D.O.   On: 02/11/2022 22:52   EEG adult  Result Date: 02/11/2022 Lora Havens, MD     02/11/2022 12:35 PM Patient Name: JULLIE ARPS MRN: [id number] Epilepsy Attending: Lora Havens Referring Physician/Provider: Mariel Aloe, MD Date: 02/11/2022 Duration: 22.44 mins Patient history: 66yo F with the ams. EEG to evaluate for seizure. Level of alertness: Awake, asleep AEDs during EEG study: None Technical aspects: This EEG study was done with scalp electrodes positioned according to the 10-20 International system of electrode placement. Electrical activity was acquired at a sampling rate of $Remov'500Hz'avXbiK$  and reviewed with a high frequency filter of $RemoveB'70Hz'TMBCAnKk$  and a low frequency filter of $RemoveB'1Hz'cjaYotov$ . EEG data were recorded continuously and digitally stored. Description: The posterior dominant rhythm consists of 8-9 Hz activity of moderate voltage (25-35 uV) seen predominantly in posterior head regions, symmetric and reactive to eye opening and eye closing. Sleep was characterized by vertex waves, sleep spindles (12 to 14 Hz), maximal frontocentral region. EEG showed intermittent generalized 3 to 6 Hz theta-delta slowing. Hyperventilation and photic stimulation were not performed.   ABNORMALITY - Intermittent slow, generalized IMPRESSION: This study is suggestive of mild diffuse encephalopathy, nonspecific etiology. No seizures or epileptiform discharges were seen throughout the recording. Priyanka Barbra Sarks   CT HEAD WO CONTRAST (5MM)  Result Date: 02/10/2022 CLINICAL DATA:  Altered mental status. EXAM: CT HEAD  WITHOUT CONTRAST TECHNIQUE: Contiguous axial images were obtained from the base of the skull through the vertex without intravenous contrast. RADIATION DOSE REDUCTION: This exam was performed according to the departmental dose-optimization program which includes automated exposure control, adjustment of the mA and/or kV according to patient size and/or use of iterative reconstruction technique. COMPARISON:  November 02, 2020 FINDINGS: Brain: There is mild cerebral atrophy with widening of the extra-axial spaces and ventricular dilatation. There are areas of decreased attenuation within the white matter tracts of the supratentorial brain, consistent with microvascular disease changes. Vascular: No hyperdense vessel or unexpected calcification. Skull: Normal. Negative for fracture or focal lesion. Sinuses/Orbits: There is mild sphenoid sinus mucosal thickening. Other: None. IMPRESSION: 1. No acute intracranial abnormality. 2. Generalized cerebral atrophy with chronic white matter small vessel ischemic changes. Electronically Signed   By: Virgina Norfolk M.D.   On: 02/10/2022 21:40   DG Chest Port 1 View  Result Date: 02/10/2022 CLINICAL DATA:  Generalized weakness, sepsis EXAM: PORTABLE CHEST 1 VIEW COMPARISON:  11/02/2020 FINDINGS: Mild bilateral interstitial thickening likely chronic and accentuated by low lung volumes. No focal consolidation. No pleural effusion or pneumothorax. Heart and mediastinal contours are unremarkable. No acute osseous abnormality. IMPRESSION: No active disease. Electronically Signed   By: Kathreen Devoid M.D.   On: 02/10/2022 17:08   DG Shoulder Right  Result Date: 02/10/2022 CLINICAL DATA:  Questionable sepsis - evaluate for abnormality. Shoulder pain. EXAM: RIGHT SHOULDER - 2+ VIEW COMPARISON:  None Available. FINDINGS: Soft tissue swelling noted laterally with gas in the soft tissues near the skin surface. Advanced degenerative changes in the right shoulder with joint space loss and  spurring. Near complete loss of subacromial space compatible with rotator cuff disease. No acute bony abnormality. Specifically, no fracture, subluxation, or dislocation. No bone destruction to suggest osteomyelitis. IMPRESSION: Soft tissue swelling laterally with gas in the superficial soft tissues. This could reflect soft tissue abscess. Advanced degenerative changes  in the right shoulder. No acute bony abnormality. Electronically Signed   By: Rolm Baptise M.D.   On: 02/10/2022 17:05      Subjective: Patient seen and examined at the bedside this morning.  Hemodynamically stable.  Desperately wants to go back to the SNF.  I called her son and updated about the discharge plan Discharge Exam: Vitals:   02/21/22 1230 02/21/22 1259  BP: 123/77 128/77  Pulse: 95 90  Resp: (!) 26 20  Temp:  98 F (36.7 C)  SpO2: 97% 98%   Vitals:   02/21/22 1212 02/21/22 1220 02/21/22 1230 02/21/22 1259  BP: 106/65 119/75 123/77 128/77  Pulse: 90 (!) 101 95 90  Resp: (!) 22 (!) 26 (!) 26 20  Temp: 97.7 F (36.5 C)   98 F (36.7 C)  TempSrc: Temporal   Oral  SpO2: 94% 100% 97% 98%  Weight:      Height:        General: Pt is alert, awake, not in acute distress, morbidly obese Cardiovascular: RRR, S1/S2 +, no rubs, no gallops Respiratory: CTA bilaterally, no wheezing, no rhonchi Abdominal: Soft, NT, ND, bowel sounds + Extremities: no edema, no cyanosis, dressing on the right shoulder, wound VAC    The results of significant diagnostics from this hospitalization (including imaging, microbiology, ancillary and laboratory) are listed below for reference.     Microbiology: Recent Results (from the past 240 hour(s))  Surgical pcr screen     Status: None   Collection Time: 02/11/22  6:11 PM  Result Value Ref Range Status   MRSA, PCR NEGATIVE NEGATIVE Final   Staphylococcus aureus NEGATIVE NEGATIVE Final    Comment: (NOTE) The Xpert SA Assay (FDA approved for NASAL specimens in patients 67 years of  age and older), is one component of a comprehensive surveillance program. It is not intended to diagnose infection nor to guide or monitor treatment. Performed at [facility], [place] [address], [place], [place] [address]   Aerobic/Anaerobic Culture w Gram Stain (surgical/deep wound)     Status: None   Collection Time: 02/12/22 11:41 AM   Specimen: Abscess  Result Value Ref Range Status   Specimen Description ABSCESS  Final   Special Requests RIGHT SHOULDER  Final   Gram Stain   Final    FEW WBC PRESENT, PREDOMINANTLY PMN NO ORGANISMS SEEN    Culture   Final    RARE ESCHERICHIA COLI NO ANAEROBES ISOLATED Performed at [facility], [place] [address], [place], [place] [address]    Report Status 02/17/2022 FINAL  Final   Organism ID, Bacteria ESCHERICHIA COLI  Final      Susceptibility   Escherichia coli - MIC*    AMPICILLIN <=2 SENSITIVE Sensitive     CEFAZOLIN <=4 SENSITIVE Sensitive     CEFEPIME <=0.12 SENSITIVE Sensitive     CEFTAZIDIME <=1 SENSITIVE Sensitive     CEFTRIAXONE <=0.25 SENSITIVE Sensitive     CIPROFLOXACIN <=0.25 SENSITIVE Sensitive     GENTAMICIN <=1 SENSITIVE Sensitive     IMIPENEM <=0.25 SENSITIVE Sensitive     TRIMETH/SULFA <=20 SENSITIVE Sensitive     AMPICILLIN/SULBACTAM <=2 SENSITIVE Sensitive     PIP/TAZO <=4 SENSITIVE Sensitive     * RARE ESCHERICHIA COLI  Culture, blood (Routine X 2) w Reflex to ID Panel     Status: None   Collection Time: 02/12/22  4:41 PM   Specimen: BLOOD  Result Value Ref Range Status   Specimen Description BLOOD  SITE NOT SPECIFIED  Final   Special Requests   Final    BOTTLES DRAWN AEROBIC AND ANAEROBIC Blood Culture adequate volume   Culture   Final    NO GROWTH 5 DAYS Performed at [facility], [address], [place], [place] [address]    Report Status 02/17/2022 FINAL  Final  Culture, blood (Routine X 2) w Reflex to ID Panel     Status: None   Collection Time: 02/12/22  5:03 PM   Specimen: BLOOD  Result  Value Ref Range Status   Specimen Description BLOOD BLOOD LEFT HAND  Final   Special Requests   Final    BOTTLES DRAWN AEROBIC AND ANAEROBIC Blood Culture adequate volume   Culture   Final    NO GROWTH 5 DAYS Performed at [facility], [place] [address], [place], [place] [address]    Report Status 02/17/2022 FINAL  Final     Labs: BNP (last 3 results) No results for input(s): "BNP" in the last 8760 hours. Basic Metabolic Panel: Recent Labs  Lab 02/17/22 0850 02/18/22 0350 02/19/22 0313 02/20/22 0555 02/21/22 0524  NA 139 137 137 137 138  K 3.3* 3.7 3.5 3.5 3.4*  CL 95* 92* 95* 96* 97*  CO2 30 28 28 28 26   GLUCOSE 128* 121* 130* 117* 118*  BUN 38* 35* 27* 23 22  CREATININE 1.56* 1.62* 1.42* 1.26* 1.23*  CALCIUM 8.0* 8.2* 8.3* 8.1* 8.3*   Liver Function Tests: No results for input(s): "AST", "ALT", "ALKPHOS", "BILITOT", "PROT", "ALBUMIN" in the last 168 hours. No results for input(s): "LIPASE", "AMYLASE" in the last 168 hours. No results for input(s): "AMMONIA" in the last 168 hours. CBC: Recent Labs  Lab 02/17/22 0850 02/17/22 1435 02/19/22 0313 02/20/22 0555 02/21/22 0524  WBC 17.4* 19.2* 19.9* 16.4* 14.3*  NEUTROABS  --  15.3* 15.2*  --   --   HGB 8.5* 8.4* 8.3* 8.4* 8.1*  HCT 27.0* 27.0* 27.2* 27.6* 26.3*  MCV 97.1 96.8 96.5 97.5 96.3  PLT 334 354 383 363 334   Cardiac Enzymes: No results for input(s): "CKTOTAL", "CKMB", "CKMBINDEX", "TROPONINI" in the last 168 hours. BNP: Invalid input(s): "POCBNP" CBG: Recent Labs  Lab 02/20/22 0855 02/20/22 1700 02/21/22 0050 02/21/22 0604 02/21/22 0805  GLUCAP 128* 131* 116* 117* 141*   D-Dimer No results for input(s): "DDIMER" in the last 72 hours. Hgb A1c No results for input(s): "HGBA1C" in the last 72 hours. Lipid Profile No results for input(s): "CHOL", "HDL", "LDLCALC", "TRIG", "CHOLHDL", "LDLDIRECT" in the last 72 hours. Thyroid function studies No results for input(s): "TSH", "T4TOTAL", "T3FREE",  "THYROIDAB" in the last 72 hours.  Invalid input(s): "FREET3" Anemia work up No results for input(s): "VITAMINB12", "FOLATE", "FERRITIN", "TIBC", "IRON", "RETICCTPCT" in the last 72 hours. Urinalysis    Component Value Date/Time   COLORURINE YELLOW 02/10/2022 1705   APPEARANCEUR HAZY (A) 02/10/2022 1705   LABSPEC 1.012 02/10/2022 1705   PHURINE 5.0 02/10/2022 1705   GLUCOSEU NEGATIVE 02/10/2022 1705   HGBUR MODERATE (A) 02/10/2022 1705   BILIRUBINUR NEGATIVE 02/10/2022 1705   KETONESUR NEGATIVE 02/10/2022 1705   PROTEINUR 100 (A) 02/10/2022 1705   NITRITE POSITIVE (A) 02/10/2022 1705   LEUKOCYTESUR MODERATE (A) 02/10/2022 1705   Sepsis Labs Recent Labs  Lab 02/17/22 1435 02/19/22 0313 02/20/22 0555 02/21/22 0524  WBC 19.2* 19.9* 16.4* 14.3*   Microbiology Recent Results (from the past 240 hour(s))  Surgical pcr screen     Status: None  Collection Time: 02/11/22  6:11 PM  Result Value Ref Range Status   MRSA, PCR NEGATIVE NEGATIVE Final   Staphylococcus aureus NEGATIVE NEGATIVE Final    Comment: (NOTE) The Xpert SA Assay (FDA approved for NASAL specimens in patients 11 years of age and older), is one component of a comprehensive surveillance program. It is not intended to diagnose infection nor to guide or monitor treatment. Performed at [facility], [place] [address], [place], [place] [address]   Aerobic/Anaerobic Culture w Gram Stain (surgical/deep wound)     Status: None   Collection Time: 02/12/22 11:41 AM   Specimen: Abscess  Result Value Ref Range Status   Specimen Description ABSCESS  Final   Special Requests RIGHT SHOULDER  Final   Gram Stain   Final    FEW WBC PRESENT, PREDOMINANTLY PMN NO ORGANISMS SEEN    Culture   Final    RARE ESCHERICHIA COLI NO ANAEROBES ISOLATED Performed at [facility], [place] [address] [place], [place] [address]    Report Status 02/17/2022 FINAL  Final   Organism ID, Bacteria ESCHERICHIA COLI  Final       Susceptibility   Escherichia coli - MIC*    AMPICILLIN <=2 SENSITIVE Sensitive     CEFAZOLIN <=4 SENSITIVE Sensitive     CEFEPIME <=0.12 SENSITIVE Sensitive     CEFTAZIDIME <=1 SENSITIVE Sensitive     CEFTRIAXONE <=0.25 SENSITIVE Sensitive     CIPROFLOXACIN <=0.25 SENSITIVE Sensitive     GENTAMICIN <=1 SENSITIVE Sensitive     IMIPENEM <=0.25 SENSITIVE Sensitive     TRIMETH/SULFA <=20 SENSITIVE Sensitive     AMPICILLIN/SULBACTAM <=2 SENSITIVE Sensitive     PIP/TAZO <=4 SENSITIVE Sensitive     * RARE ESCHERICHIA COLI  Culture, blood (Routine X 2) w Reflex to ID Panel     Status: None   Collection Time: 02/12/22  4:41 PM   Specimen: BLOOD  Result Value Ref Range Status   Specimen Description BLOOD SITE NOT SPECIFIED  Final   Special Requests   Final    BOTTLES DRAWN AEROBIC AND ANAEROBIC Blood Culture adequate volume   Culture   Final    NO GROWTH 5 DAYS Performed at [facility], [address], [place], [place] [address]    Report Status 02/17/2022 FINAL  Final  Culture, blood (Routine X 2) w Reflex to ID Panel     Status: None   Collection Time: 02/12/22  5:03 PM   Specimen: BLOOD  Result Value Ref Range Status   Specimen Description BLOOD BLOOD LEFT HAND  Final   Special Requests   Final    BOTTLES DRAWN AEROBIC AND ANAEROBIC Blood Culture adequate volume   Culture   Final    NO GROWTH 5 DAYS Performed at [facility], [place] [address], [place], [place] [address]    Report Status 02/17/2022 FINAL  Final    Please note: You were cared for by a hospitalist during your hospital stay. Once you are discharged, your primary care physician will handle any further medical issues. Please note that NO REFILLS for any discharge medications will be authorized once you are discharged, as it is imperative that you return to your primary care physician (or establish a relationship with a primary care physician if you do not have one) for your post hospital discharge needs so that  they can reassess your need for medications and monitor your lab values.    Time coordinating discharge:  40 minutes  SIGNED:   Shelly Coss, MD  [facility] 02/21/2022, 1:03 PM Pager [phone]  If 7PM-7AM, please contact night-coverage [email or web address] Password [email or web address]

## 2022-02-21 NOTE — Anesthesia Postprocedure Evaluation (Signed)
Anesthesia Post Note  Patient: Crystal Moses  Procedure(s) Performed: TRANSESOPHAGEAL ECHOCARDIOGRAM (TEE)     Patient location during evaluation: Endoscopy Anesthesia Type: MAC Level of consciousness: awake and alert Pain management: pain level controlled Vital Signs Assessment: post-procedure vital signs reviewed and stable Respiratory status: spontaneous breathing, nonlabored ventilation, respiratory function stable and patient connected to nasal cannula oxygen Cardiovascular status: blood pressure returned to baseline and stable Postop Assessment: no apparent nausea or vomiting Anesthetic complications: no   No notable events documented.  Last Vitals:  Vitals:   02/21/22 1230 02/21/22 1259  BP: 123/77 128/77  Pulse: 95 90  Resp: (!) 26 20  Temp:  36.7 C  SpO2: 97% 98%    Last Pain:  Vitals:   02/21/22 1344  TempSrc:   PainSc: 0-No pain                 Barnet Glasgow

## 2022-02-21 NOTE — Progress Notes (Addendum)
TEE negative for vegetation. OPAT antibiotics orders in place. F/U in ID clinic on 7/24.

## 2022-02-21 NOTE — TOC Transition Note (Addendum)
Transition of Care [facility]) - CM/SW Discharge Note   Patient Details  Name: Crystal Moses MRN: [id number] Date of Birth: 07-22-1956  [facility] of [facility]) CM/SW Contact:  Erin Sons, LCSW Phone Number: 02/21/2022, 1:24 PM   Clinical Narrative:     PT/OT recommending ongoing therapy. SNF would like CSW to start SNF auth. Auth started in [place]; Ref#: [id number]. Pt can still DC to SNF as she is LTC.   Patient will DC to: [name]  Anticipated DC date: 02/21/22 Family notified: Son Ivin Booty Transport by: Sharin Mons   Per MD patient ready for DC to [facility]. RN, patient, patient's family, and facility notified of DC. Discharge Summary and FL2 sent to facility. RN to call report prior to discharge [phone]). DC packet on chart. Ambulance transport requested for patient.   CSW will sign off for now as social work intervention is no longer needed. Please consult [place] again if new needs arise.   Final next level of care: Skilled Nursing Facility Barriers to Discharge: No Barriers Identified   Patient Goals and CMS Choice        Discharge Placement              Patient chooses bed at: [facility] Patient to be transferred to facility by: [facility] Name of family member notified: Son Ivin Booty Patient and family notified of of transfer: 02/21/22  Discharge Plan and Services                                     Social Determinants of Health (SDOH) Interventions     Readmission Risk Interventions     No data to display

## 2022-02-23 ENCOUNTER — Encounter (HOSPITAL_COMMUNITY): Payer: Self-pay | Admitting: Internal Medicine

## 2022-02-24 ENCOUNTER — Telehealth: Payer: Self-pay

## 2022-02-24 NOTE — Telephone Encounter (Signed)
C[place] with rehab called regarding [employer]. She had right shoulder I&D on 02/14/2022. According to Lajoyce Corners she was suppose to come in a week out from surgery. Dr.Duda is out all week and Denny Peon is full. Can you please call and schedule f/u? I was not sure how imperative it was for the follow up this week since its already been over a week. Call back #[phone] or #[phone] Thanks!

## 2022-02-24 NOTE — Telephone Encounter (Signed)
SW rehab facility, [facility], give her VO to remove wound vac today and wash with dial soap and apply dry dressing. Pt is scheduled 02/28/22 for post op apt.

## 2022-02-28 ENCOUNTER — Ambulatory Visit (INDEPENDENT_AMBULATORY_CARE_PROVIDER_SITE_OTHER): Payer: Medicare HMO | Admitting: Family

## 2022-02-28 DIAGNOSIS — M25511 Pain in right shoulder: Secondary | ICD-10-CM

## 2022-03-01 ENCOUNTER — Encounter (HOSPITAL_COMMUNITY): Payer: Self-pay | Admitting: Emergency Medicine

## 2022-03-01 ENCOUNTER — Emergency Department (HOSPITAL_COMMUNITY): Payer: Medicare HMO

## 2022-03-01 ENCOUNTER — Emergency Department (HOSPITAL_COMMUNITY)
Admission: EM | Admit: 2022-03-01 | Discharge: 2022-03-02 | Disposition: A | Discharge status: Skilled Nursing Facility | Payer: Medicare HMO | Attending: Emergency Medicine | Admitting: Emergency Medicine

## 2022-03-01 ENCOUNTER — Other Ambulatory Visit: Payer: Self-pay

## 2022-03-01 DIAGNOSIS — I6782 Cerebral ischemia: Secondary | ICD-10-CM | POA: Insufficient documentation

## 2022-03-01 DIAGNOSIS — R252 Cramp and spasm: Secondary | ICD-10-CM | POA: Insufficient documentation

## 2022-03-01 DIAGNOSIS — R4182 Altered mental status, unspecified: Secondary | ICD-10-CM | POA: Diagnosis not present

## 2022-03-01 DIAGNOSIS — I4510 Unspecified right bundle-branch block: Secondary | ICD-10-CM | POA: Diagnosis not present

## 2022-03-01 LAB — CBC WITH DIFFERENTIAL/PLATELET
Abs Immature Granulocytes: 0.09 10*3/uL — ABNORMAL HIGH (ref 0.00–0.07)
Basophils Absolute: 0.1 10*3/uL (ref 0.0–0.1)
Basophils Relative: 1 %
Eosinophils Absolute: 0.5 10*3/uL (ref 0.0–0.5)
Eosinophils Relative: 5 %
HCT: 31.5 % — ABNORMAL LOW (ref 36.0–46.0)
Hemoglobin: 9.5 g/dL — ABNORMAL LOW (ref 12.0–15.0)
Immature Granulocytes: 1 %
Lymphocytes Relative: 21 %
Lymphs Abs: 2.3 10*3/uL (ref 0.7–4.0)
MCH: 28.9 pg (ref 26.0–34.0)
MCHC: 30.2 g/dL (ref 30.0–36.0)
MCV: 95.7 fL (ref 80.0–100.0)
Monocytes Absolute: 0.8 10*3/uL (ref 0.1–1.0)
Monocytes Relative: 8 %
Neutro Abs: 6.8 10*3/uL (ref 1.7–7.7)
Neutrophils Relative %: 64 %
Platelets: 301 10*3/uL (ref 150–400)
RBC: 3.29 MIL/uL — ABNORMAL LOW (ref 3.87–5.11)
RDW: 14.6 % (ref 11.5–15.5)
WBC: 10.6 10*3/uL — ABNORMAL HIGH (ref 4.0–10.5)
nRBC: 0 % (ref 0.0–0.2)

## 2022-03-01 LAB — COMPREHENSIVE METABOLIC PANEL
ALT: 10 U/L (ref 0–44)
AST: 15 U/L (ref 15–41)
Albumin: 2.6 g/dL — ABNORMAL LOW (ref 3.5–5.0)
Alkaline Phosphatase: 60 U/L (ref 38–126)
Anion gap: 12 (ref 5–15)
BUN: 26 mg/dL — ABNORMAL HIGH (ref 8–23)
CO2: 31 mmol/L (ref 22–32)
Calcium: 8.1 mg/dL — ABNORMAL LOW (ref 8.9–10.3)
Chloride: 93 mmol/L — ABNORMAL LOW (ref 98–111)
Creatinine, Ser: 1.42 mg/dL — ABNORMAL HIGH (ref 0.44–1.00)
GFR, Estimated: 41 mL/min — ABNORMAL LOW (ref >60)
Glucose, Bld: 126 mg/dL — ABNORMAL HIGH (ref 70–99)
Potassium: 3.6 mmol/L (ref 3.5–5.1)
Sodium: 136 mmol/L (ref 135–145)
Total Bilirubin: 0.3 mg/dL (ref 0.3–1.2)
Total Protein: 7.2 g/dL (ref 6.5–8.1)

## 2022-03-01 LAB — LACTIC ACID, PLASMA: Lactic Acid, Venous: 1.4 mmol/L (ref 0.5–1.9)

## 2022-03-01 LAB — APTT: aPTT: 27 seconds (ref 24–36)

## 2022-03-01 LAB — PROTIME-INR
INR: 1.1 (ref 0.8–1.2)
Prothrombin Time: 14.2 seconds (ref 11.4–15.2)

## 2022-03-01 NOTE — ED Triage Notes (Signed)
Pt bib EMS from [facility] [place] after they were called out for AMS but EMS states facility wasn't sure what was going on with the pt so "that's what they said when they called". EMS states facility then stated pt said she was having a "hot flash" and c/o bilateral leg pain. Pt denies any complaints at this time. Pt drowsy but easily awakened to verbal stimuli. Per EMS, pt recently treated for sepsis and is on IV abx at the nursing home.

## 2022-03-02 ENCOUNTER — Emergency Department (HOSPITAL_COMMUNITY): Payer: Medicare HMO

## 2022-03-02 DIAGNOSIS — R252 Cramp and spasm: Secondary | ICD-10-CM | POA: Diagnosis not present

## 2022-03-02 LAB — URINALYSIS, ROUTINE W REFLEX MICROSCOPIC
Bilirubin Urine: NEGATIVE
Glucose, UA: NEGATIVE mg/dL
Hgb urine dipstick: NEGATIVE
Ketones, ur: NEGATIVE mg/dL
Leukocytes,Ua: NEGATIVE
Nitrite: NEGATIVE
Protein, ur: NEGATIVE mg/dL
Specific Gravity, Urine: 1.012 (ref 1.005–1.030)
pH: 5 (ref 5.0–8.0)

## 2022-03-02 LAB — LACTIC ACID, PLASMA: Lactic Acid, Venous: 1 mmol/L (ref 0.5–1.9)

## 2022-03-02 MED ORDER — HALOPERIDOL LACTATE 5 MG/ML IJ SOLN: 2.0000 mg | Freq: Once | INTRAMUSCULAR | Status: DC

## 2022-03-02 NOTE — ED Notes (Signed)
[facility] notified of patient needing transportation back to nursing facility.

## 2022-03-02 NOTE — ED Notes (Signed)
[facility] to transport pt to [facility] in [place], [name], MD made aware of pts HR 115 prior to being discharged, per [name], MD pt received a full assessment and is to move forward with discharge

## 2022-03-02 NOTE — ED Notes (Signed)
Pt transported back to [facility], attempted to call reports x 2

## 2022-03-02 NOTE — ED Provider Notes (Signed)
[facility] EMERGENCY DEPARTMENT Provider Note   CSN: [id number] Arrival date & time: 03/01/22  2243     History {Add pertinent medical, surgical, social history, OB history to HPI:1} Chief Complaint  Patient presents with   Hot Flashes    Crystal Moses is a 66 y.o. female.  Patient sent to the emergency department from nursing home.  Patient comes to the ER by ambulance.  EMS was apparently called to the nursing home for altered mental status.  Patient somnolent at arrival but awakens to voice and answers all questions appropriately.  She reports that she has no current complaints.       Home Medications Prior to Admission medications   Medication Sig Start Date End Date Taking? Authorizing Provider  acetaminophen (TYLENOL) 500 MG tablet Take 1,000 mg by mouth every 12 (twelve) hours as needed for fever or moderate pain. >100.1. Not to exceed 3 grams in 24 hours.    [provider]  ALPRAZolam Duanne Moron) 0.5 MG tablet Take 1 tablet (0.5 mg total) by mouth in the morning, at noon, and at bedtime. 02/21/22   Shelly Coss, MD  atorvastatin (LIPITOR) 80 MG tablet Take 80 mg by mouth at bedtime. 10/16/20   [provider]  calcium carbonate (TUMS) 500 MG chewable tablet Chew 1 tablet by mouth every 8 (eight) hours as needed for heartburn.    [provider]  cefTRIAXone (ROCEPHIN) IVPB Inject 2 g into the vein daily. Indication:  E.coli R-shoulder abscess/osteo First Dose: Yes Last Day of Therapy:  03/28/22 Labs - Once weekly:  CBC/D and BMP, Labs - Every other week:  ESR and CRP Method of administration: IV Push Pull PICC at the completion of IV therapy Method of administration may be changed at the discretion of home infusion pharmacist based upon assessment of the patient and/or caregiver's ability to self-administer the medication ordered. 02/21/22 04/04/22  Shelly Coss, MD  diclofenac Sodium (VOLTAREN) 1 % GEL Apply 1 Application topically 4 (four) times  daily. 01/31/22   [provider]  divalproex (DEPAKOTE SPRINKLE) 125 MG capsule Take 250 mg by mouth 2 (two) times daily.    [provider]  escitalopram (LEXAPRO) 20 MG tablet Take 20 mg by mouth daily. 01/15/22   [provider]  folic acid (FOLVITE) 1 MG tablet Take 1 mg by mouth daily.    [provider]  gabapentin (NEURONTIN) 300 MG capsule Take 300-600 mg by mouth as directed. Take 1 capsule (300 mg) BID & Take 2 capsules (600 mg) at bedtime    [provider]  ipratropium-albuterol (DUONEB) 0.5-2.5 (3) MG/3ML SOLN Inhale 3 mLs into the lungs every 6 (six) hours as needed (sob/wheezing). 01/03/22   [provider]  lidocaine (LIDODERM) 5 % Place 1 patch onto the skin daily. Remove & Discard patch within 12 hours or as directed by MD    [provider]  loperamide (IMODIUM A-D) 2 MG tablet Take 2 mg by mouth daily as needed for diarrhea or loose stools.    [provider]  magnesium oxide (MAG-OX) 400 MG tablet Take 400 mg by mouth daily.    [provider]  melatonin 3 MG TABS tablet Take 6 mg by mouth at bedtime.    [provider]  meloxicam (MOBIC) 15 MG tablet Take 1 tablet (15 mg total) by mouth 2 (two) times daily as needed for pain. 02/21/22   Shelly Coss, MD  metoprolol succinate (TOPROL-XL) 25 MG 24 hr tablet  Take 1 tablet by mouth daily. 11/14/20   [provider]  oxymetazoline (AFRIN) 0.05 % nasal spray Place 1 spray into both nostrils 2 (two) times daily.    [provider]  polyethylene glycol (MIRALAX / GLYCOLAX) 17 g packet Take 17 g by mouth daily as needed for moderate constipation.    [provider]  Potassium Chloride ER 20 MEQ TBCR Take 1 tablet by mouth daily. 01/20/22   [provider]  sodium chloride (OCEAN) 0.65 % nasal spray Place 1 spray into the nose every 2 (two) hours as needed for congestion.    [provider]  torsemide  (DEMADEX) 20 MG tablet Take 40 mg by mouth in the morning.    [provider]  traMADol (ULTRAM) 50 MG tablet Take 1 tablet (50 mg total) by mouth every 6 (six) hours as needed for moderate pain. 02/21/22   Shelly Coss, MD      Allergies    Patient has no known allergies.    Review of Systems   Review of Systems  Physical Exam Updated Vital Signs BP 110/62 (BP Location: Right Arm)   Pulse 96   Temp 98.8 F (37.1 C) (Oral)   Resp 20   Ht 5' 1" (1.549 m)   Wt 113 kg   SpO2 92%   BMI 47.07 kg/m  Physical Exam Vitals and nursing note reviewed.  Constitutional:      General: She is not in acute distress.    Appearance: She is well-developed.     Comments: Drowsy  HENT:     Head: Normocephalic and atraumatic.     Mouth/Throat:     Mouth: Mucous membranes are moist.  Eyes:     General: Vision grossly intact. Gaze aligned appropriately.     Extraocular Movements: Extraocular movements intact.     Conjunctiva/sclera: Conjunctivae normal.  Cardiovascular:     Rate and Rhythm: Normal rate and regular rhythm.     Pulses: Normal pulses.     Heart sounds: Normal heart sounds, S1 normal and S2 normal. No murmur heard.    No friction rub. No gallop.  Pulmonary:     Effort: Pulmonary effort is normal. No respiratory distress.     Breath sounds: Normal breath sounds.  Abdominal:     General: Bowel sounds are normal.     Palpations: Abdomen is soft.     Tenderness: There is no abdominal tenderness. There is no guarding or rebound.     Hernia: No hernia is present.  Musculoskeletal:        General: No swelling.     Cervical back: Full passive range of motion without pain, normal range of motion and neck supple. No spinous process tenderness or muscular tenderness. Normal range of motion.     Right lower leg: No edema.     Left lower leg: No edema.  Skin:    General: Skin is warm and dry.     Capillary Refill: Capillary refill takes less than 2 seconds.     Findings: No  ecchymosis, erythema, rash or wound.  Neurological:     General: No focal deficit present.     Mental Status: She is oriented to person, place, and time.     GCS: GCS eye subscore is 4. GCS verbal subscore is 5. GCS motor subscore is 6.     Cranial Nerves: Cranial nerves 2-12 are intact.     Sensory: Sensation is intact.     Motor: Motor  function is intact.     Coordination: Coordination is intact.  Psychiatric:        Attention and Perception: Attention normal.        Mood and Affect: Mood normal.        Speech: Speech normal.        Behavior: Behavior normal.     ED Results / Procedures / Treatments   Labs (all labs ordered are listed, but only abnormal results are displayed) Labs Reviewed  COMPREHENSIVE METABOLIC PANEL - Abnormal; Notable for the following components:      Result Value   Chloride 93 (*)    Glucose, Bld 126 (*)    BUN 26 (*)    Creatinine, Ser 1.42 (*)    Calcium 8.1 (*)    Albumin 2.6 (*)    GFR, Estimated 41 (*)    All other components within normal limits  CBC WITH DIFFERENTIAL/PLATELET - Abnormal; Notable for the following components:   WBC 10.6 (*)    RBC 3.29 (*)    Hemoglobin 9.5 (*)    HCT 31.5 (*)    Abs Immature Granulocytes 0.09 (*)    All other components within normal limits  CULTURE, BLOOD (ROUTINE X 2)  CULTURE, BLOOD (ROUTINE X 2)  URINE CULTURE  LACTIC ACID, PLASMA  PROTIME-INR  APTT  LACTIC ACID, PLASMA  URINALYSIS, ROUTINE W REFLEX MICROSCOPIC    EKG None  Radiology DG Chest Port 1 View  Result Date: 03/01/2022 CLINICAL DATA:  Questionable sepsis - evaluate for abnormality Recent right shoulder debridement. EXAM: PORTABLE CHEST 1 VIEW COMPARISON:  Chest radiograph 02/14/2022 FINDINGS: Lung volumes are low. Left upper extremity PICC tip overlies the upper SVC. Streaky opacity at the left lung base favoring atelectasis. No confluent consolidation. Normal heart size for technique. No pneumothorax or pleural effusion. Resection  of the right humeral head consistent with recent surgery. IMPRESSION: 1. Low lung volumes with streaky opacity at the left lung base, favoring atelectasis. 2. Left upper extremity PICC tip in the upper SVC. 3. Recent right humeral head resection. Electronically Signed   By: Keith Rake M.D.   On: 03/01/2022 23:51    Procedures Procedures  {Document cardiac monitor, telemetry assessment procedure when appropriate:1}  Medications Ordered in ED Medications - No data to display  ED Course/ Medical Decision Making/ A&P                           Medical Decision Making Amount and/or Complexity of Data Reviewed Labs: ordered. Radiology: ordered. ECG/medicine tests: ordered.   ***  {Document critical care time when appropriate:1} {Document review of labs and clinical decision tools ie heart score, Chads2Vasc2 etc:1}  {Document your independent review of radiology images, and any outside records:1} {Document your discussion with family members, caretakers, and with consultants:1} {Document social determinants of health affecting pt's care:1} {Document your decision making why or why not admission, treatments were needed:1} Final Clinical Impression(s) / ED Diagnoses Final diagnoses:  None    Rx / DC Orders ED Discharge Orders     None

## 2022-03-03 LAB — URINE CULTURE

## 2022-03-04 ENCOUNTER — Encounter: Payer: Self-pay | Admitting: Family

## 2022-03-04 NOTE — Progress Notes (Signed)
   Post-Op Visit Note   Patient: [place]           Date of Birth: [birth year]           MRN: [id number] Visit Date: 02/28/2022 PCP: Charlynne Pander, MD  Chief Complaint:  Chief Complaint  Patient presents with   Right Shoulder - Routine Post Op    02/12/22 right shoulder deb w/ resection of humeral head 02/14/22 repeat deb w/ wound vac Vac taken off 02/24/22 at SNF    HPI:  HPI The patient is a 66 year old woman who is seen today in postoperative follow-up status post debridement of her right shoulder with resection of the humeral head.  Penrose drain in place.  She is residing at skilled nursing Ortho Exam Incision is healing well proximally.  Penrose drain to distal incision this was removed today without incident.  There is serosanguineous drainage, moderate.  There is no surrounding erythema no odor Visit Diagnoses:  1. Acute pain of right shoulder     Plan: Continue daily Dial soap cleansing dry dressings follow-up in office in 2 weeks  Follow-Up Instructions: No follow-ups on file.   Imaging: No results found.  Orders:  No orders of the defined types were placed in this encounter.  No orders of the defined types were placed in this encounter.    PMFS History: Patient Active Problem List   Diagnosis Date Noted   Class 3 obesity (HCC) 02/17/2022   AKI (acute kidney injury) (HCC) 02/10/2022   Abscess of right shoulder 02/10/2022   DM (diabetes mellitus) (HCC) 02/10/2022   CHF (congestive heart failure) (HCC) 02/10/2022   HTN (hypertension) 02/10/2022   Acute metabolic encephalopathy 02/10/2022   Greater trochanter fracture (HCC) 11/03/2020   Closed left hip fracture, initial encounter (HCC) 11/02/2020   Past Medical History:  Diagnosis Date   Bilateral lower extremity edema    CAD (coronary artery disease)    CHF (congestive heart failure) (HCC)    Diabetes mellitus without complication (HCC)    Hyperlipidemia    Hypertension    SOB (shortness of breath)      Family History  Family history unknown: Yes    Past Surgical History:  Procedure Laterality Date   APPENDECTOMY     APPLICATION OF WOUND VAC  02/12/2022   Procedure: APPLICATION OF WOUND VAC;  Surgeon: Nadara Mustard, MD;  Location: [facility] OR;  Service: Orthopedics;;   CERVICAL FUSION     CORONARY ANGIOPLASTY WITH STENT PLACEMENT     HERNIA REPAIR     I & D EXTREMITY Right 02/12/2022   Procedure: RIGHT SHOULDER DEBRIDEMENT WITH RESECTION OF HUMERAL HEAD;  Surgeon: Nadara Mustard, MD;  Location: [facility] OR;  Service: Orthopedics;  Laterality: Right;   I & D EXTREMITY Right 02/14/2022   Procedure: REPEAT DEBRIDEMENT RIGHT SHOULDER;  Surgeon: Nadara Mustard, MD;  Location: [facility] OR;  Service: Orthopedics;  Laterality: Right;   TEE WITHOUT CARDIOVERSION N/A 02/21/2022   Procedure: TRANSESOPHAGEAL ECHOCARDIOGRAM (TEE);  Surgeon: [name], MD;  Location: [facility] ENDOSCOPY;  Service: Cardiovascular;  Laterality: N/A;   Social History   Occupational History   Not on file  Tobacco Use   Smoking status: Former   Smokeless tobacco: Never  Substance and Sexual Activity   Alcohol use: Yes   Drug use: Not Currently   Sexual activity: Not on file

## 2022-03-06 LAB — CULTURE, BLOOD (ROUTINE X 2)
Culture: NO GROWTH
Special Requests: ADEQUATE

## 2022-03-07 LAB — CULTURE, BLOOD (ROUTINE X 2)
Culture: NO GROWTH
Special Requests: ADEQUATE

## 2022-03-10 ENCOUNTER — Encounter: Payer: Self-pay | Admitting: Family

## 2022-03-10 ENCOUNTER — Other Ambulatory Visit: Payer: Self-pay

## 2022-03-10 ENCOUNTER — Ambulatory Visit (INDEPENDENT_AMBULATORY_CARE_PROVIDER_SITE_OTHER): Payer: Medicare HMO | Admitting: Orthopedic Surgery

## 2022-03-10 ENCOUNTER — Telehealth: Payer: Self-pay

## 2022-03-10 ENCOUNTER — Encounter: Payer: Self-pay | Admitting: Orthopedic Surgery

## 2022-03-10 ENCOUNTER — Ambulatory Visit (INDEPENDENT_AMBULATORY_CARE_PROVIDER_SITE_OTHER): Payer: Medicare HMO | Admitting: Family

## 2022-03-10 VITALS — BP 110/71 | HR 77 | Temp 98.1°F

## 2022-03-10 DIAGNOSIS — A498 Other bacterial infections of unspecified site: Secondary | ICD-10-CM

## 2022-03-10 DIAGNOSIS — M00811 Arthritis due to other bacteria, right shoulder: Secondary | ICD-10-CM

## 2022-03-10 DIAGNOSIS — Z95828 Presence of other vascular implants and grafts: Secondary | ICD-10-CM

## 2022-03-10 DIAGNOSIS — M869 Osteomyelitis, unspecified: Secondary | ICD-10-CM

## 2022-03-10 NOTE — Assessment & Plan Note (Signed)
PICC line in left upper extremity is clean and dry and working appropriately. Continue PICC care through 03/28/22 with removal following completion of treatment.

## 2022-03-10 NOTE — Patient Instructions (Signed)
Nice to see you.  We will continue your ceftriaxone through 03/28/22 and have them remove your PICC line at that time.   Continue wound care per Dr. Lajoyce Corners.  Follow up with ID as needed.   Have a great day!

## 2022-03-10 NOTE — Telephone Encounter (Signed)
Verbal orders given to Marchelle Folks, Charity fundraiser at [employer], Per Marcos Eke, NP - Pull PICC after last dose of IV ceftriaxone on 03/28/2022. Marchelle Folks, RN repeated verbal orders back to me and verbalized her understanding.    Vonya Ohalloran Lesli Albee, CMA

## 2022-03-10 NOTE — Assessment & Plan Note (Signed)
[place] continues to tolerate her ceftriaxone for E. Coli infection s/p debridement. Surgical incision is well approximated and no evidence of infection. Will check lab work from her skilled facility. Appears to be progressing without complication. Continue ceftriaxone through 03/28/22 as planned with removal of PICC at the completion of treatment. Continue wound care per Dr. Lajoyce Corners. Follow up with ID as needed.

## 2022-03-10 NOTE — Assessment & Plan Note (Signed)
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 No systemic symptoms. Complete 6 weeks of ceftriaxone on 03/28/22.

## 2022-03-10 NOTE — Progress Notes (Signed)
Subjective:    Patient ID: Crystal Moses, female    DOB: 10/29/1955, 66 y.o.   MRN: [id number]  Chief Complaint  Patient presents with   Hospitalization Follow-up    Pji - right shoulder    HPI:  Crystal Moses is a 66 y.o. 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   Crystal Moses was found to have septic right glenohumeral joint with completed erosion of the rotator cuff and deltoid who underwent debridement and resection of the humeral head with treatment plan for 6 weeks of Ceftriaxone with clearance of blood cultures and negative TEE. Discharged with PICC line with end of treatment date of 03/28/22. Seen by Orthopedics on 02/28/22  with good healing and well approximated incision with penrose drain removal. Also seen in the ED on 02/28/22 with leg cramps with no significant findings.   Crystal Moses has been receiving her ceftriaxone daily as prescribed with no adverse side effects. Continues to have some shoulder pain. PICC line in left upper extremity. Wants to be able to sit on the side of the bed and is currently in a room by herself.     No Known Allergies    Outpatient Medications Prior to Visit  Medication Sig Dispense Refill   acetaminophen (TYLENOL) 500 MG tablet Take 1,000 mg by mouth every 12 (twelve) hours as needed for fever or moderate pain. >100.1. Not to exceed 3 grams in 24 hours.     ALPRAZolam (XANAX) 0.5 MG tablet Take 1 tablet (0.5 mg total) by mouth in the morning, at noon, and at bedtime. 15 tablet 0   atorvastatin (LIPITOR) 80 MG tablet Take 80 mg by mouth at bedtime.     calcium carbonate (TUMS) 500 MG chewable tablet Chew 1 tablet by mouth every 8 (eight) hours as needed for heartburn.     cefTRIAXone (ROCEPHIN) IVPB Inject 2 g into the  vein daily. Indication:  E.coli R-shoulder abscess/osteo First Dose: Yes Last Day of Therapy:  03/28/22 Labs - Once weekly:  CBC/D and BMP, Labs - Every other week:  ESR and CRP Method of administration: IV Push Pull PICC at the completion of IV therapy Method of administration may be changed at the discretion of home infusion pharmacist based upon assessment of the patient and/or caregiver's ability to self-administer the medication ordered. 42 Units 0   diclofenac Sodium (VOLTAREN) 1 % GEL Apply 1 Application topically 4 (four) times daily.     divalproex (DEPAKOTE SPRINKLE) 125 MG capsule Take 250 mg by mouth 2 (two) times daily.     escitalopram (LEXAPRO) 20 MG tablet Take 20 mg by mouth daily.     folic acid (FOLVITE) 1 MG tablet Take 1 mg by mouth daily.     gabapentin (NEURONTIN) 300 MG capsule Take 300-600 mg by mouth as directed. Take 1 capsule (300 mg) BID & Take 2 capsules (600 mg) at bedtime     ipratropium-albuterol (DUONEB) 0.5-2.5 (3) MG/3ML SOLN Inhale 3 mLs into the lungs every 6 (six) hours as needed (sob/wheezing).     lidocaine (LIDODERM) 5 % Place 1 patch onto the skin daily. Remove & Discard patch within 12 hours or as directed by MD     loperamide (IMODIUM A-D) 2 MG tablet Take 2 mg by  mouth daily as needed for diarrhea or loose stools.     magnesium oxide (MAG-OX) 400 MG tablet Take 400 mg by mouth daily.     melatonin 3 MG TABS tablet Take 6 mg by mouth at bedtime.     meloxicam (MOBIC) 15 MG tablet Take 1 tablet (15 mg total) by mouth 2 (two) times daily as needed for pain.     metoprolol succinate (TOPROL-XL) 25 MG 24 hr tablet Take 1 tablet by mouth daily.     oxymetazoline (AFRIN) 0.05 % nasal spray Place 1 spray into both nostrils 2 (two) times daily.     polyethylene glycol (MIRALAX / GLYCOLAX) 17 g packet Take 17 g by mouth daily as needed for moderate constipation.     Potassium Chloride ER 20 MEQ TBCR Take 1 tablet by mouth daily.     sodium chloride (OCEAN)  0.65 % nasal spray Place 1 spray into the nose every 2 (two) hours as needed for congestion.     torsemide (DEMADEX) 20 MG tablet Take 40 mg by mouth in the morning.     traMADol (ULTRAM) 50 MG tablet Take 1 tablet (50 mg total) by mouth every 6 (six) hours as needed for moderate pain. 15 tablet 0   No facility-administered medications prior to visit.     Past Medical History:  Diagnosis Date   Bilateral lower extremity edema    CAD (coronary artery disease)    CHF (congestive heart failure) (HCC)    Diabetes mellitus without complication ([place])    Hyperlipidemia    Hypertension    SOB (shortness of breath)      Past Surgical History:  Procedure Laterality Date   APPENDECTOMY     APPLICATION OF WOUND VAC  02/12/2022   Procedure: APPLICATION OF WOUND VAC;  Surgeon: Newt Minion, MD;  Location: [place];  Service: Orthopedics;;   CERVICAL FUSION     CORONARY ANGIOPLASTY WITH STENT PLACEMENT     HERNIA REPAIR     I & D EXTREMITY Right 02/12/2022   Procedure: RIGHT SHOULDER DEBRIDEMENT WITH RESECTION OF HUMERAL HEAD;  Surgeon: Newt Minion, MD;  Location: [place];  Service: Orthopedics;  Laterality: Right;   I & D EXTREMITY Right 02/14/2022   Procedure: REPEAT DEBRIDEMENT RIGHT SHOULDER;  Surgeon: Newt Minion, MD;  Location: [place];  Service: Orthopedics;  Laterality: Right;   TEE WITHOUT CARDIOVERSION N/A 02/21/2022   Procedure: TRANSESOPHAGEAL ECHOCARDIOGRAM (TEE);  Surgeon: Pixie Casino, MD;  Location[facility] ENDOSCOPY;  Service: Cardiovascular;  Laterality: N/A;       Review of Systems  Constitutional:  Negative for chills, diaphoresis, fatigue and fever.  Respiratory:  Negative for cough, chest tightness, shortness of breath and wheezing.   Cardiovascular:  Negative for chest pain.  Gastrointestinal:  Negative for abdominal pain, diarrhea, nausea and vomiting.  Musculoskeletal:        Positive for shoulder pain.       Objective:    BP 110/71   Pulse 77   Temp 98.1 F  (36.7 C) (Oral)  Nursing note and vital signs reviewed.  Physical Exam Constitutional:      General: She is not in acute distress.    Appearance: She is well-developed. She is obese.  Cardiovascular:     Rate and Rhythm: Normal rate and regular rhythm.     Heart sounds: Normal heart sounds.     Comments: Left arm PICC line is clean and dry. No evidence of infection.  Pulmonary:     Effort: Pulmonary effort is normal.     Breath sounds: Normal breath sounds.  Musculoskeletal:     Comments: Right shoulder with well approximated, no drainage or odor.   Skin:    General: Skin is warm and dry.  Neurological:     Mental Status: She is alert. She is disoriented.         03/10/2022   10:40 AM  Depression screen PHQ 2/9  Decreased Interest 2  Down, Depressed, Hopeless 2  PHQ - 2 Score 4       Assessment & Plan:    Patient Active Problem List   Diagnosis Date Noted   Osteomyelitis of right shoulder ([place]) 03/10/2022   E coli infection 03/10/2022   Status post peripherally inserted central catheter (PICC) central line placement 03/10/2022   Class 3 obesity ([place]) 02/17/2022   AKI (acute kidney injury) ([place]) 02/10/2022   Abscess of right shoulder 02/10/2022   DM (diabetes mellitus) ([place]) 02/10/2022   CHF (congestive heart failure) ([place]) 02/10/2022   HTN (hypertension) 05/69/7948   Acute metabolic encephalopathy 01/65/5374   Greater trochanter fracture ([place]) 11/03/2020   Closed left hip fracture, initial encounter ([place]) 11/02/2020     Problem List Items Addressed This Visit       Musculoskeletal and Integument   Osteomyelitis of right shoulder ([place]) - Primary    Crystal Moses continues to tolerate her ceftriaxone for E. Coli infection s/p debridement. Surgical incision is well approximated and no evidence of infection. Will check lab work from her skilled facility. Appears to be progressing without complication. Continue ceftriaxone through 03/28/22 as planned with removal of  PICC at the completion of treatment. Continue wound care per Dr. Sharol Given. Follow up with ID as needed.         Other   E coli infection    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 No systemic symptoms. Complete 6 weeks of ceftriaxone on 03/28/22.       Status post peripherally inserted central catheter (PICC) central line placement    PICC line in left upper extremity is clean and dry and working appropriately. Continue PICC care through 03/28/22 with removal following completion of treatment.         I am having Crystal Moses maintain her atorvastatin, divalproex, lidocaine, magnesium oxide, melatonin, metoprolol succinate, oxymetazoline, polyethylene glycol, torsemide, ipratropium-albuterol, escitalopram, diclofenac Sodium, Potassium Chloride ER, folic acid, gabapentin, acetaminophen, loperamide, sodium chloride, calcium carbonate, cefTRIAXone, meloxicam, ALPRAZolam, and traMADol.   Follow-up: Return if symptoms worsen or fail to improve.   Terri Piedra, MSN, FNP-C Nurse Practitioner [facility] [place] number: [phone]

## 2022-03-10 NOTE — Progress Notes (Signed)
Office Visit Note   Patient: Crystal Moses           Date of Birth: 1955-09-12           MRN: [id number] Visit Date: 03/10/2022              Requested by: Charlynne Pander, MD [address] [place],  [place] [address] PCP: Charlynne Pander, MD  Chief Complaint  Patient presents with   Right Shoulder - Routine Post Op    02/12/22 right shoulder deb w/ resection of humeral head 02/14/22 repeat deb      HPI: Patient is a 66 year old woman who is status post 2 surgical debridements for a septic right shoulder secondary to E. coli.  Patient underwent resection of the humeral head.  Patient is currently at skilled nursing.  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  She currently has a Medical sales representative for transfers.  Discussed that she could return to her independent living once she can transfer on her own.    Follow-Up Instructions: Return if symptoms worsen or fail to improve.   Ortho Exam  Patient is alert, oriented, no adenopathy, well-dressed, normal affect, normal respiratory effort. Examination the incision is well-healed there is no redness no cellulitis no drainage 2 remaining sutures were removed.  She has good range of motion of her hand and elbow with no motor or sensory deficits.  She has about 45 degrees of abduction and flexion of the shoulder which is symmetric bilaterally.  Imaging: No results found. No images are attached to the encounter.  Labs: Lab Results  Component Value Date   HGBA1C 5.3 11/02/2020   REPTSTATUS 03/07/2022 FINAL 03/02/2022   GRAMSTAIN  02/12/2022    FEW WBC PRESENT, PREDOMINANTLY PMN NO ORGANISMS SEEN    CULT  03/02/2022    NO GROWTH 5 DAYS Performed at [facility], [address] [place], [place] [address]    [facility] ESCHERICHIA COLI 02/12/2022     Lab Results  Component Value Date   ALBUMIN 2.6 (L)  03/01/2022   ALBUMIN 3.0 (L) 02/10/2022   ALBUMIN 1.8 (L) 11/05/2020   PREALBUMIN 6.3 (L) 11/05/2020    Lab Results  Component Value Date   MG 1.8 11/06/2020   MG 1.5 (L) 11/04/2020   MG 1.6 (L) 11/03/2020   No results found for: "VD25OH"  Lab Results  Component Value Date   PREALBUMIN 6.3 (L) 11/05/2020      Latest Ref Rng & Units 03/01/2022   11:03 PM 02/21/2022    5:24 AM 02/20/2022    5:55 AM  CBC EXTENDED  WBC 4.0 - 10.5 K/uL 10.6  14.3  16.4   RBC 3.87 - 5.11 MIL/uL 3.29  2.73  2.83   Hemoglobin 12.0 - 15.0 g/dL 9.5  8.1  8.4   HCT 16.6 - 46.0 % 31.5  26.3  27.6   Platelets 150 - 400 K/uL 301  334  363   NEUT# 1.7 - 7.7 K/uL 6.8     Lymph# 0.7 - 4.0 K/uL 2.3        There is no height or weight on file to calculate BMI.  Orders:  No orders of the defined types were placed in this encounter.  No orders of the defined types were placed in this encounter.    Procedures: No procedures performed  Clinical  Data: No additional findings.  ROS:  All other systems negative, except as noted in the HPI. Review of Systems  Objective: Vital Signs: There were no vitals taken for this visit.  Specialty Comments:  No specialty comments available.  PMFS History: Patient Active Problem List   Diagnosis Date Noted   Osteomyelitis of right shoulder (HCC) 03/10/2022   E coli infection 03/10/2022   Status post peripherally inserted central catheter (PICC) central line placement 03/10/2022   Class 3 obesity (HCC) 02/17/2022   AKI (acute kidney injury) (HCC) 02/10/2022   Abscess of right shoulder 02/10/2022   DM (diabetes mellitus) (HCC) 02/10/2022   CHF (congestive heart failure) (HCC) 02/10/2022   HTN (hypertension) 02/10/2022   Acute metabolic encephalopathy 02/10/2022   Greater trochanter fracture (HCC) 11/03/2020   Closed left hip fracture, initial encounter (HCC) 11/02/2020   Past Medical History:  Diagnosis Date   Bilateral lower extremity edema    CAD  (coronary artery disease)    CHF (congestive heart failure) (HCC)    Diabetes mellitus without complication (HCC)    Hyperlipidemia    Hypertension    SOB (shortness of breath)     Family History  Family history unknown: Yes    Past Surgical History:  Procedure Laterality Date   APPENDECTOMY     APPLICATION OF WOUND VAC  02/12/2022   Procedure: APPLICATION OF WOUND VAC;  Surgeon: Nadara Mustard, MD;  Location: [facility] OR;  Service: Orthopedics;;   CERVICAL FUSION     CORONARY ANGIOPLASTY WITH STENT PLACEMENT     HERNIA REPAIR     I & D EXTREMITY Right 02/12/2022   Procedure: RIGHT SHOULDER DEBRIDEMENT WITH RESECTION OF HUMERAL HEAD;  Surgeon: Nadara Mustard, MD;  Location: [facility] OR;  Service: Orthopedics;  Laterality: Right;   I & D EXTREMITY Right 02/14/2022   Procedure: REPEAT DEBRIDEMENT RIGHT SHOULDER;  Surgeon: Nadara Mustard, MD;  Location: [facility] OR;  Service: Orthopedics;  Laterality: Right;   TEE WITHOUT CARDIOVERSION N/A 02/21/2022   Procedure: TRANSESOPHAGEAL ECHOCARDIOGRAM (TEE);  Surgeon: [name], MD;  Location: [facility] ENDOSCOPY;  Service: Cardiovascular;  Laterality: N/A;   Social History   Occupational History   Not on file  Tobacco Use   Smoking status: Former   Smokeless tobacco: Never  Substance and Sexual Activity   Alcohol use: Yes   Drug use: Not Currently   Sexual activity: Not on file

## 2022-03-27 ENCOUNTER — Telehealth: Payer: Self-pay | Admitting: Gastroenterology

## 2022-03-27 NOTE — Telephone Encounter (Signed)
We received referral from [facility] regarding patient.  Consult was for "jaundice".  We did not have any accompanying records regarding jaundice such as labs or any imaging.  Review of recent admission at [facility] with no abdominal imaging.  Patient with normal bilirubin of 0.3 on March 01, 2022, 6 days prior to consultation request.  We need further records regarding their concerns for jaundice such as any labs or imaging.   I have been out of office all week and off majority of next week so please show records to another available provider (please attach to the chart on my desk that has referral information.

## 2022-03-28 NOTE — Telephone Encounter (Signed)
Requested records from nursing facility and once I get those back I will forward to another provider to review.

## 2022-04-02 NOTE — Telephone Encounter (Signed)
I am still waiting to receive additional records with referral. I sent another request today.

## 2022-04-12 ENCOUNTER — Encounter (HOSPITAL_COMMUNITY): Payer: Self-pay | Admitting: Emergency Medicine

## 2022-04-12 ENCOUNTER — Emergency Department (HOSPITAL_COMMUNITY): Payer: Medicare HMO

## 2022-04-12 ENCOUNTER — Other Ambulatory Visit: Payer: Self-pay

## 2022-04-12 ENCOUNTER — Inpatient Hospital Stay (HOSPITAL_COMMUNITY)
Admission: EM | Admit: 2022-04-12 | Discharge: 2022-04-15 | DRG: 177 | Disposition: A | Discharge status: Skilled Nursing Facility | Payer: Medicare HMO | Source: Skilled Nursing Facility | Attending: Internal Medicine | Admitting: Internal Medicine

## 2022-04-12 DIAGNOSIS — Z87891 Personal history of nicotine dependence: Secondary | ICD-10-CM | POA: Diagnosis not present

## 2022-04-12 DIAGNOSIS — E785 Hyperlipidemia, unspecified: Secondary | ICD-10-CM | POA: Diagnosis present

## 2022-04-12 DIAGNOSIS — I2510 Atherosclerotic heart disease of native coronary artery without angina pectoris: Secondary | ICD-10-CM | POA: Diagnosis present

## 2022-04-12 DIAGNOSIS — Z79890 Hormone replacement therapy (postmenopausal): Secondary | ICD-10-CM

## 2022-04-12 DIAGNOSIS — U071 COVID-19: Secondary | ICD-10-CM | POA: Diagnosis not present

## 2022-04-12 DIAGNOSIS — G8929 Other chronic pain: Secondary | ICD-10-CM | POA: Diagnosis present

## 2022-04-12 DIAGNOSIS — Z87440 Personal history of urinary (tract) infections: Secondary | ICD-10-CM

## 2022-04-12 DIAGNOSIS — E119 Type 2 diabetes mellitus without complications: Secondary | ICD-10-CM | POA: Diagnosis present

## 2022-04-12 DIAGNOSIS — E876 Hypokalemia: Secondary | ICD-10-CM | POA: Diagnosis not present

## 2022-04-12 DIAGNOSIS — I110 Hypertensive heart disease with heart failure: Secondary | ICD-10-CM | POA: Diagnosis present

## 2022-04-12 DIAGNOSIS — Z6841 Body Mass Index (BMI) 40.0 and over, adult: Secondary | ICD-10-CM | POA: Diagnosis not present

## 2022-04-12 DIAGNOSIS — Z981 Arthrodesis status: Secondary | ICD-10-CM | POA: Diagnosis not present

## 2022-04-12 DIAGNOSIS — N1831 Chronic kidney disease, stage 3a: Secondary | ICD-10-CM | POA: Diagnosis present

## 2022-04-12 DIAGNOSIS — G9341 Metabolic encephalopathy: Secondary | ICD-10-CM | POA: Diagnosis present

## 2022-04-12 DIAGNOSIS — J9601 Acute respiratory failure with hypoxia: Secondary | ICD-10-CM | POA: Diagnosis present

## 2022-04-12 DIAGNOSIS — I5032 Chronic diastolic (congestive) heart failure: Secondary | ICD-10-CM | POA: Diagnosis present

## 2022-04-12 DIAGNOSIS — N390 Urinary tract infection, site not specified: Secondary | ICD-10-CM

## 2022-04-12 DIAGNOSIS — J1282 Pneumonia due to coronavirus disease 2019: Secondary | ICD-10-CM | POA: Diagnosis present

## 2022-04-12 DIAGNOSIS — R0902 Hypoxemia: Secondary | ICD-10-CM | POA: Diagnosis not present

## 2022-04-12 DIAGNOSIS — G9340 Encephalopathy, unspecified: Secondary | ICD-10-CM | POA: Diagnosis present

## 2022-04-12 DIAGNOSIS — I10 Essential (primary) hypertension: Secondary | ICD-10-CM | POA: Diagnosis present

## 2022-04-12 DIAGNOSIS — Z79899 Other long term (current) drug therapy: Secondary | ICD-10-CM

## 2022-04-12 DIAGNOSIS — I509 Heart failure, unspecified: Secondary | ICD-10-CM

## 2022-04-12 LAB — CBC WITH DIFFERENTIAL/PLATELET
Abs Immature Granulocytes: 0.04 10*3/uL (ref 0.00–0.07)
Basophils Absolute: 0.1 10*3/uL (ref 0.0–0.1)
Basophils Relative: 1 %
Eosinophils Absolute: 0.1 10*3/uL (ref 0.0–0.5)
Eosinophils Relative: 1 %
HCT: 34.2 % — ABNORMAL LOW (ref 36.0–46.0)
Hemoglobin: 10.4 g/dL — ABNORMAL LOW (ref 12.0–15.0)
Immature Granulocytes: 0 %
Lymphocytes Relative: 10 %
Lymphs Abs: 0.9 10*3/uL (ref 0.7–4.0)
MCH: 28.7 pg (ref 26.0–34.0)
MCHC: 30.4 g/dL (ref 30.0–36.0)
MCV: 94.2 fL (ref 80.0–100.0)
Monocytes Absolute: 0.6 10*3/uL (ref 0.1–1.0)
Monocytes Relative: 7 %
Neutro Abs: 7.3 10*3/uL (ref 1.7–7.7)
Neutrophils Relative %: 81 %
Platelets: 263 10*3/uL (ref 150–400)
RBC: 3.63 MIL/uL — ABNORMAL LOW (ref 3.87–5.11)
RDW: 16.1 % — ABNORMAL HIGH (ref 11.5–15.5)
WBC: 9.1 10*3/uL (ref 4.0–10.5)
nRBC: 0 % (ref 0.0–0.2)

## 2022-04-12 LAB — COMPREHENSIVE METABOLIC PANEL
ALT: 11 U/L (ref 0–44)
AST: 19 U/L (ref 15–41)
Albumin: 3.4 g/dL — ABNORMAL LOW (ref 3.5–5.0)
Alkaline Phosphatase: 58 U/L (ref 38–126)
Anion gap: 10 (ref 5–15)
BUN: 23 mg/dL (ref 8–23)
CO2: 26 mmol/L (ref 22–32)
Calcium: 8.6 mg/dL — ABNORMAL LOW (ref 8.9–10.3)
Chloride: 95 mmol/L — ABNORMAL LOW (ref 98–111)
Creatinine, Ser: 1.57 mg/dL — ABNORMAL HIGH (ref 0.44–1.00)
GFR, Estimated: 36 mL/min — ABNORMAL LOW (ref >60)
Glucose, Bld: 125 mg/dL — ABNORMAL HIGH (ref 70–99)
Potassium: 4.1 mmol/L (ref 3.5–5.1)
Sodium: 131 mmol/L — ABNORMAL LOW (ref 135–145)
Total Bilirubin: 0.6 mg/dL (ref 0.3–1.2)
Total Protein: 8 g/dL (ref 6.5–8.1)

## 2022-04-12 LAB — URINALYSIS, ROUTINE W REFLEX MICROSCOPIC
Bacteria, UA: NONE SEEN
Bilirubin Urine: NEGATIVE
Glucose, UA: NEGATIVE mg/dL
Ketones, ur: NEGATIVE mg/dL
Nitrite: NEGATIVE
Protein, ur: NEGATIVE mg/dL
Specific Gravity, Urine: 1.01 (ref 1.005–1.030)
pH: 6 (ref 5.0–8.0)

## 2022-04-12 LAB — SARS CORONAVIRUS 2 BY RT PCR: SARS Coronavirus 2 by RT PCR: POSITIVE — AB

## 2022-04-12 LAB — PROTIME-INR
INR: 1 (ref 0.8–1.2)
Prothrombin Time: 13.3 seconds (ref 11.4–15.2)

## 2022-04-12 LAB — APTT: aPTT: 28 seconds (ref 24–36)

## 2022-04-12 LAB — C-REACTIVE PROTEIN: CRP: 2.2 mg/dL — ABNORMAL HIGH (ref <1.0)

## 2022-04-12 LAB — D-DIMER, QUANTITATIVE: D-Dimer, Quant: 3.01 ug/mL-FEU — ABNORMAL HIGH (ref 0.00–0.50)

## 2022-04-12 LAB — HEMOGLOBIN A1C
Hgb A1c MFr Bld: 5.5 % (ref 4.8–5.6)
Mean Plasma Glucose: 111.15 mg/dL

## 2022-04-12 LAB — CBG MONITORING, ED
Glucose-Capillary: 108 mg/dL — ABNORMAL HIGH (ref 70–99)
Glucose-Capillary: 106 mg/dL — ABNORMAL HIGH (ref 70–99)

## 2022-04-12 LAB — PROCALCITONIN: Procalcitonin: <0.1 ng/mL

## 2022-04-12 LAB — LACTIC ACID, PLASMA: Lactic Acid, Venous: 1.1 mmol/L (ref 0.5–1.9)

## 2022-04-12 LAB — FERRITIN: Ferritin: 23 ng/mL (ref 11–307)

## 2022-04-12 MED ORDER — ACETAMINOPHEN 325 MG PO TABS: 650.0000 mg | ORAL_TABLET | Freq: Once | ORAL | Status: DC

## 2022-04-12 MED ORDER — ACETAMINOPHEN 325 MG PO TABS
650.0000 mg | ORAL_TABLET | Freq: Four times a day (QID) | ORAL | Status: DC | PRN
  Administered 2022-04-12 – 2022-04-13 (×3): 650 mg via ORAL
  Filled 2022-04-12 (×3): qty 2

## 2022-04-12 MED ORDER — LINAGLIPTIN 5 MG PO TABS
5.0000 mg | ORAL_TABLET | Freq: Every day | ORAL | Status: DC
  Administered 2022-04-13 – 2022-04-15 (×2): 5 mg via ORAL
  Filled 2022-04-12 (×3): qty 1

## 2022-04-12 MED ORDER — DIVALPROEX SODIUM 125 MG PO CSDR
250.0000 mg | DELAYED_RELEASE_CAPSULE | Freq: Two times a day (BID) | ORAL | Status: DC
  Administered 2022-04-12 – 2022-04-15 (×6): 250 mg via ORAL
  Filled 2022-04-12 (×6): qty 2

## 2022-04-12 MED ORDER — ALPRAZOLAM 0.5 MG PO TABS
0.5000 mg | ORAL_TABLET | Freq: Three times a day (TID) | ORAL | Status: DC | PRN
Start: 2022-04-12 — End: 2022-04-14
  Administered 2022-04-12 – 2022-04-13 (×3): 0.5 mg via ORAL
  Filled 2022-04-12 (×3): qty 1

## 2022-04-12 MED ORDER — SODIUM CHLORIDE 0.9 % IV BOLUS
1000.0000 mL | Freq: Once | INTRAVENOUS | Status: AC
Start: 2022-04-12 — End: 2022-04-12
  Administered 2022-04-12: 1000 mL via INTRAVENOUS

## 2022-04-12 MED ORDER — LAMOTRIGINE 25 MG PO TABS
50.0000 mg | ORAL_TABLET | Freq: Every day | ORAL | Status: DC
  Administered 2022-04-13 – 2022-04-15 (×3): 50 mg via ORAL
  Filled 2022-04-12 (×3): qty 2

## 2022-04-12 MED ORDER — GABAPENTIN 300 MG PO CAPS
300.0000 mg | ORAL_CAPSULE | Freq: Two times a day (BID) | ORAL | Status: DC
  Administered 2022-04-13 – 2022-04-14 (×3): 300 mg via ORAL
  Filled 2022-04-12 (×3): qty 1

## 2022-04-12 MED ORDER — ACETAMINOPHEN 325 MG PO TABS
650.0000 mg | ORAL_TABLET | Freq: Once | ORAL | Status: DC
  Filled 2022-04-12: qty 2

## 2022-04-12 MED ORDER — FAMOTIDINE IN NACL 20-0.9 MG/50ML-% IV SOLN
20.0000 mg | INTRAVENOUS | Status: DC
  Administered 2022-04-12 – 2022-04-14 (×3): 20 mg via INTRAVENOUS
  Filled 2022-04-12 (×3): qty 50

## 2022-04-12 MED ORDER — ATORVASTATIN CALCIUM 40 MG PO TABS
80.0000 mg | ORAL_TABLET | Freq: Every day | ORAL | Status: DC
  Administered 2022-04-12: 80 mg via ORAL
  Filled 2022-04-12: qty 2

## 2022-04-12 MED ORDER — ASCORBIC ACID 500 MG PO TABS
500.0000 mg | ORAL_TABLET | Freq: Every day | ORAL | Status: DC
  Administered 2022-04-12 – 2022-04-15 (×4): 500 mg via ORAL
  Filled 2022-04-12 (×4): qty 1

## 2022-04-12 MED ORDER — ENOXAPARIN SODIUM 40 MG/0.4ML IJ SOSY
40.0000 mg | PREFILLED_SYRINGE | INTRAMUSCULAR | Status: DC
  Administered 2022-04-12 – 2022-04-14 (×3): 40 mg via SUBCUTANEOUS
  Filled 2022-04-12 (×3): qty 0.4

## 2022-04-12 MED ORDER — NIRMATRELVIR/RITONAVIR (PAXLOVID) TABLET (RENAL DOSING)
2.0000 | ORAL_TABLET | Freq: Two times a day (BID) | ORAL | Status: DC
  Administered 2022-04-13 – 2022-04-15 (×5): 2 via ORAL
  Filled 2022-04-12 (×2): qty 20

## 2022-04-12 MED ORDER — INSULIN ASPART 100 UNIT/ML IJ SOLN
0.0000 [IU] | Freq: Three times a day (TID) | INTRAMUSCULAR | Status: DC
  Administered 2022-04-15: 5 [IU] via SUBCUTANEOUS
  Administered 2022-04-15: 3 [IU] via SUBCUTANEOUS

## 2022-04-12 MED ORDER — SODIUM CHLORIDE 0.9 % IV SOLN
1.0000 g | INTRAVENOUS | Status: DC
  Administered 2022-04-12 – 2022-04-13 (×2): 1 g via INTRAVENOUS
  Filled 2022-04-12 (×2): qty 10

## 2022-04-12 MED ORDER — GABAPENTIN 300 MG PO CAPS: 300.0000 mg | ORAL_CAPSULE | ORAL | Status: DC

## 2022-04-12 MED ORDER — METOPROLOL SUCCINATE ER 25 MG PO TB24
25.0000 mg | ORAL_TABLET | Freq: Every day | ORAL | Status: DC
Start: 2022-04-12 — End: 2022-04-15
  Administered 2022-04-12 – 2022-04-15 (×3): 25 mg via ORAL
  Filled 2022-04-12 (×4): qty 1

## 2022-04-12 MED ORDER — ZINC SULFATE 220 (50 ZN) MG PO CAPS
220.0000 mg | ORAL_CAPSULE | Freq: Every day | ORAL | Status: DC
  Administered 2022-04-12 – 2022-04-15 (×4): 220 mg via ORAL
  Filled 2022-04-12 (×4): qty 1

## 2022-04-12 MED ORDER — TORSEMIDE 20 MG PO TABS
40.0000 mg | ORAL_TABLET | Freq: Every morning | ORAL | Status: DC
  Administered 2022-04-13 – 2022-04-14 (×2): 40 mg via ORAL
  Filled 2022-04-12 (×2): qty 2

## 2022-04-12 MED ORDER — IBUPROFEN 600 MG PO TABS: 600.0000 mg | ORAL_TABLET | Freq: Four times a day (QID) | ORAL | Status: DC | PRN

## 2022-04-12 MED ORDER — INSULIN ASPART 100 UNIT/ML IJ SOLN
0.0000 [IU] | Freq: Every day | INTRAMUSCULAR | Status: DC
  Administered 2022-04-14: 2 [IU] via SUBCUTANEOUS

## 2022-04-12 MED ORDER — FAMOTIDINE IN NACL 20-0.9 MG/50ML-% IV SOLN: 20.0000 mg | Freq: Two times a day (BID) | INTRAVENOUS | Status: DC

## 2022-04-12 MED ORDER — ONDANSETRON HCL 4 MG PO TABS: 4.0000 mg | ORAL_TABLET | Freq: Four times a day (QID) | ORAL | Status: DC | PRN

## 2022-04-12 MED ORDER — GABAPENTIN 300 MG PO CAPS
600.0000 mg | ORAL_CAPSULE | Freq: Every day | ORAL | Status: DC
Start: 2022-04-12 — End: 2022-04-14
  Administered 2022-04-13 (×2): 600 mg via ORAL
  Filled 2022-04-12 (×2): qty 2

## 2022-04-12 MED ORDER — ONDANSETRON HCL 4 MG/2ML IJ SOLN: 4.0000 mg | Freq: Four times a day (QID) | INTRAMUSCULAR | Status: DC | PRN

## 2022-04-12 MED ORDER — ACETAMINOPHEN 650 MG RE SUPP
650.0000 mg | Freq: Once | RECTAL | Status: AC
Start: 2022-04-12 — End: 2022-04-12
  Administered 2022-04-12: 650 mg via RECTAL
  Filled 2022-04-12: qty 1

## 2022-04-12 NOTE — ED Notes (Signed)
Herbert Seta, RN, [facility] aware of need for nirmatrelvir/ritonavir EUA (PAXLOVID)

## 2022-04-12 NOTE — ED Notes (Signed)
Patient placed onto hospital bed from stretcher for patient comfort.

## 2022-04-12 NOTE — H&P (Signed)
History and Physical    Patient: Crystal Moses BTD:[id number] DOB: 12-23-1955 DOA: 04/12/2022 DOS: the patient was seen and examined on 04/12/2022 PCP: Charlynne Pander, MD  Patient coming from: SNF  Chief Complaint:  Chief Complaint  Patient presents with   Altered Mental Status   HPI: Crystal Moses is a 66 y.o. female with medical history significant of diabetes, heart failure with preserved EF, hypertension.  Patient is a resident of [facility].  For the past day, the patient has been increased confused and with a fever.  Patient does have fairly frequent UTIs.  Patient complains of headache and pain in her legs.  Patient is otherwise unable to provide history and history is obtained by the chart.  She denies cough, shortness of breath.  Review of Systems: As mentioned in the history of present illness. All other systems reviewed and are negative. Past Medical History:  Diagnosis Date   Bilateral lower extremity edema    CAD (coronary artery disease)    CHF (congestive heart failure) (HCC)    Diabetes mellitus without complication (HCC)    Hyperlipidemia    Hypertension    SOB (shortness of breath)    Past Surgical History:  Procedure Laterality Date   APPENDECTOMY     APPLICATION OF WOUND VAC  02/12/2022   Procedure: APPLICATION OF WOUND VAC;  Surgeon: Nadara Mustard, MD;  Location: [facility] OR;  Service: Orthopedics;;   CERVICAL FUSION     CORONARY ANGIOPLASTY WITH STENT PLACEMENT     HERNIA REPAIR     I & D EXTREMITY Right 02/12/2022   Procedure: RIGHT SHOULDER DEBRIDEMENT WITH RESECTION OF HUMERAL HEAD;  Surgeon: Nadara Mustard, MD;  Location: [facility] OR;  Service: Orthopedics;  Laterality: Right;   I & D EXTREMITY Right 02/14/2022   Procedure: REPEAT DEBRIDEMENT RIGHT SHOULDER;  Surgeon: Nadara Mustard, MD;  Location: [facility] OR;  Service: Orthopedics;  Laterality: Right;   TEE WITHOUT CARDIOVERSION N/A 02/21/2022   Procedure: TRANSESOPHAGEAL ECHOCARDIOGRAM (TEE);  Surgeon: [name],  MD;  Location: [facility] ENDOSCOPY;  Service: Cardiovascular;  Laterality: N/A;   Social History:  reports that she has quit smoking. She has never used smokeless tobacco. She reports current alcohol use. She reports that she does not currently use drugs.  No Known Allergies  Family History  Family history unknown: Yes    Prior to Admission medications   Medication Sig Start Date End Date Taking? Authorizing Provider  acetaminophen (TYLENOL) 500 MG tablet Take 1,000 mg by mouth every 12 (twelve) hours as needed for fever or moderate pain. >100.1. Not to exceed 3 grams in 24 hours.    [provider]  ALPRAZolam Prudy Feeler) 0.5 MG tablet Take 1 tablet (0.5 mg total) by mouth in the morning, at noon, and at bedtime. 02/21/22   Burnadette Pop, MD  atorvastatin (LIPITOR) 80 MG tablet Take 80 mg by mouth at bedtime. 10/16/20   [provider]  calcium carbonate (TUMS) 500 MG chewable tablet Chew 1 tablet by mouth every 8 (eight) hours as needed for heartburn.    [provider]  cefTRIAXone (ROCEPHIN) 10 g injection  02/28/22   [provider]  diclofenac Sodium (VOLTAREN) 1 % GEL Apply 1 Application topically 4 (four) times daily. 01/31/22   [provider]  divalproex (DEPAKOTE SPRINKLE) 125 MG capsule Take 250 mg by mouth 2 (two) times daily.    [provider]  escitalopram (LEXAPRO) 20 MG tablet Take 20 mg by  mouth daily. 01/15/22   [provider]  folic acid (FOLVITE) 1 MG tablet Take 1 mg by mouth daily.    [provider]  gabapentin (NEURONTIN) 300 MG capsule Take 300-600 mg by mouth as directed. Take 1 capsule (300 mg) BID & Take 2 capsules (600 mg) at bedtime    [provider]  ipratropium-albuterol (DUONEB) 0.5-2.5 (3) MG/3ML SOLN Inhale 3 mLs into the lungs every 6 (six) hours as needed (sob/wheezing). 01/03/22   [provider]  lamoTRIgine (LAMICTAL) 25 MG tablet Take 50 mg by mouth daily. 03/28/22   [provider]  lidocaine (LIDODERM) 5 % Place 1 patch onto the skin daily. Remove & Discard patch within 12 hours or as directed by MD    [provider]  loperamide (IMODIUM A-D) 2 MG tablet Take 2 mg by mouth daily as needed for diarrhea or loose stools.    [provider]  magnesium oxide (MAG-OX) 400 MG tablet Take 400 mg by mouth daily.    [provider]  melatonin 3 MG TABS tablet Take 6 mg by mouth at bedtime.    [provider]  meloxicam (MOBIC) 15 MG tablet Take 1 tablet (15 mg total) by mouth 2 (two) times daily as needed for pain. 02/21/22   Burnadette Pop, MD  metoprolol succinate (TOPROL-XL) 25 MG 24 hr tablet Take 1 tablet by mouth daily. 11/14/20   [provider]  mirtazapine (REMERON) 7.5 MG tablet Take 7.5 mg by mouth at bedtime. 03/28/22   [provider]  nystatin (MYCOSTATIN) 100000 UNIT/ML suspension Take by mouth. 04/04/22   [provider]  oxymetazoline (AFRIN) 0.05 % nasal spray Place 1 spray into both nostrils 2 (two) times daily.    [provider]  polyethylene glycol (MIRALAX / GLYCOLAX) 17 g packet Take 17 g by mouth daily as needed for moderate constipation.    [provider]  Potassium Chloride ER 20 MEQ TBCR Take 1 tablet by mouth daily. 01/20/22   [provider]  sodium chloride (OCEAN) 0.65 % nasal spray Place 1 spray into the nose every 2 (two) hours as needed for congestion.    [provider]  torsemide (DEMADEX) 20 MG tablet Take 40 mg by mouth in the morning.    [provider]  traMADol (ULTRAM) 50 MG tablet Take 1 tablet (50 mg total) by mouth every 6 (six) hours as needed for moderate pain. 02/21/22   Burnadette Pop, MD    Physical Exam: Vitals:   04/12/22 1806 04/12/22 1830 04/12/22 1930 04/12/22 1945  BP: (!) 149/97 (!) 144/75 (!) 150/89   Pulse: 94 82 87   Resp: (!) 22 17 14    Temp: (!) 101.9 F (38.8 C)   (!) 101.6 F (38.7 C)  TempSrc:  Rectal   Rectal  SpO2: 94% 94% 93%    General: Elderly female. Awake and alert.  Patient not oriented to place or time. No acute cardiopulmonary distress.  HEENT: Normocephalic atraumatic.  Right and left ears normal in appearance.  Pupils equal, round, reactive to light. Extraocular muscles are intact. Sclerae anicteric and noninjected.  Moist mucosal membranes. No mucosal lesions.  Neck: Neck supple without lymphadenopathy. No carotid bruits. No masses palpated.  Cardiovascular: Regular rate with normal S1-S2 sounds. No murmurs, rubs, gallops auscultated. No JVD.  Respiratory: Good respiratory effort with no wheezes, rales, rhonchi. Lungs clear to auscultation bilaterally.  No accessory muscle use. Abdomen: Soft, nontender, nondistended. Active bowel sounds.  No masses or hepatosplenomegaly  Skin: No rashes, lesions, or ulcerations.  Dry, warm to touch. 2+ dorsalis pedis and radial pulses. Musculoskeletal: No calf or leg pain. All major joints not erythematous nontender.  No upper or lower joint deformation.  Good ROM.  No contractures  Psychiatric: Intact judgment and insight. Pleasant and cooperative. Neurologic: No focal neurological deficits. Strength is 5/5 and symmetric in upper and lower extremities.  Cranial nerves II through XII are grossly intact.  Data Reviewed: Results for orders placed or performed during the hospital encounter of 04/12/22 (from the past 24 hour(s))  Lactic acid, plasma     Status: None   Collection Time: 04/12/22  3:00 PM  Result Value Ref Range   Lactic Acid, Venous 1.1 0.5 - 1.9 mmol/L  Comprehensive metabolic panel     Status: Abnormal   Collection Time: 04/12/22  3:00 PM  Result Value Ref Range   Sodium 131 (L) 135 - 145 mmol/L   Potassium 4.1 3.5 - 5.1 mmol/L   Chloride 95 (L) 98 - 111 mmol/L   CO2 26 22 - 32 mmol/L   Glucose, Bld 125 (H) 70 - 99 mg/dL   BUN 23 8 - 23 mg/dL   Creatinine, Ser 1.47 (H) 0.44 - 1.00 mg/dL   Calcium 8.6 (L) 8.9 - 10.3  mg/dL   Total Protein 8.0 6.5 - 8.1 g/dL   Albumin 3.4 (L) 3.5 - 5.0 g/dL   AST 19 15 - 41 U/L   ALT 11 0 - 44 U/L   Alkaline Phosphatase 58 38 - 126 U/L   Total Bilirubin 0.6 0.3 - 1.2 mg/dL   GFR, Estimated 36 (L) >60 mL/min   Anion gap 10 5 - 15  CBC with Differential     Status: Abnormal   Collection Time: 04/12/22  3:00 PM  Result Value Ref Range   WBC 9.1 4.0 - 10.5 K/uL   RBC 3.63 (L) 3.87 - 5.11 MIL/uL   Hemoglobin 10.4 (L) 12.0 - 15.0 g/dL   HCT 82.9 (L) 56.2 - 13.0 %   MCV 94.2 80.0 - 100.0 fL   MCH 28.7 26.0 - 34.0 pg   MCHC 30.4 30.0 - 36.0 g/dL   RDW 86.5 (H) 78.4 - 69.6 %   Platelets 263 150 - 400 K/uL   nRBC 0.0 0.0 - 0.2 %   Neutrophils Relative % 81 %   Neutro Abs 7.3 1.7 - 7.7 K/uL   Lymphocytes Relative 10 %   Lymphs Abs 0.9 0.7 - 4.0 K/uL   Monocytes Relative 7 %   Monocytes Absolute 0.6 0.1 - 1.0 K/uL   Eosinophils Relative 1 %   Eosinophils Absolute 0.1 0.0 - 0.5 K/uL   Basophils Relative 1 %   Basophils Absolute 0.1 0.0 - 0.1 K/uL   Immature Granulocytes 0 %   Abs Immature Granulocytes 0.04 0.00 - 0.07 K/uL  Protime-INR     Status: None   Collection Time: 04/12/22  3:00 PM  Result Value Ref Range   Prothrombin Time 13.3 11.4 - 15.2 seconds   INR 1.0 0.8 - 1.2  APTT     Status: None   Collection Time: 04/12/22  3:00 PM  Result Value Ref Range   aPTT 28 24 - 36 seconds  Blood Culture (routine x 2)     Status: None (Preliminary result)   Collection Time: 04/12/22  3:00 PM   Specimen: BLOOD LEFT FOREARM  Result Value Ref Range   Specimen Description  BLOOD LEFT FOREARM BOTTLES DRAWN AEROBIC AND ANAEROBIC   Special Requests      Blood Culture results may not be optimal due to an excessive volume of blood received in culture bottles Performed at [facility], [address] [place], [place] [address]    Culture PENDING    Report Status PENDING   Blood Culture (routine x 2)     Status: None (Preliminary result)   Collection Time: 04/12/22   3:00 PM   Specimen: Right Antecubital; Blood  Result Value Ref Range   Specimen Description      RIGHT ANTECUBITAL BOTTLES DRAWN AEROBIC AND ANAEROBIC   Special Requests      Blood Culture results may not be optimal due to an excessive volume of blood received in culture bottles Performed at [facility], [address] [place], [place] [address]    Culture PENDING    Report Status PENDING   D-dimer, quantitative     Status: Abnormal   Collection Time: 04/12/22  3:00 PM  Result Value Ref Range   D-Dimer, Quant 3.01 (H) 0.00 - 0.50 ug/mL-FEU  Procalcitonin - Baseline     Status: None   Collection Time: 04/12/22  3:00 PM  Result Value Ref Range   Procalcitonin <0.10 ng/mL  SARS Coronavirus 2 by RT PCR (hospital order, performed in [facility] hospital lab) *cepheid single result test* Anterior Nasal Swab     Status: Abnormal   Collection Time: 04/12/22  3:10 PM   Specimen: Anterior Nasal Swab  Result Value Ref Range   SARS Coronavirus 2 by RT PCR POSITIVE (A) NEGATIVE  Urinalysis, Routine w reflex microscopic Urine, In & Out Cath     Status: Abnormal   Collection Time: 04/12/22  4:00 PM  Result Value Ref Range   Color, Urine YELLOW YELLOW   APPearance HAZY (A) CLEAR   Specific Gravity, Urine 1.010 1.005 - 1.030   pH 6.0 5.0 - 8.0   Glucose, UA NEGATIVE NEGATIVE mg/dL   Hgb urine dipstick SMALL (A) NEGATIVE   Bilirubin Urine NEGATIVE NEGATIVE   Ketones, ur NEGATIVE NEGATIVE mg/dL   Protein, ur NEGATIVE NEGATIVE mg/dL   Nitrite NEGATIVE NEGATIVE   Leukocytes,Ua LARGE (A) NEGATIVE   RBC / HPF 0-5 0 - 5 RBC/hpf   WBC, UA 21-50 0 - 5 WBC/hpf   Bacteria, UA NONE SEEN NONE SEEN   Squamous Epithelial / LPF 0-5 0 - 5   Mucus PRESENT    Non Squamous Epithelial 0-5 (A) NONE SEEN   DG Chest Port 1 View  Result Date: 04/12/2022 CLINICAL DATA:  Altered mental status, fever EXAM: PORTABLE CHEST 1 VIEW COMPARISON:  03/01/2022 FINDINGS: Transverse diameter of heart is slightly increased.  This may be partly due to poor inspiration. Linear densities are seen in left lower lung field. Left hemidiaphragm is elevated. There are no signs of alveolar pulmonary edema or focal consolidation. There is no pleural effusion or pneumothorax. Proximal right humerus is not seen suggesting previous removal. There is surgical fusion in cervical spine. IMPRESSION: Linear densities in left lower lung fields suggest scarring or subsegmental atelectasis. There are no signs of pulmonary edema or focal pulmonary consolidation. Electronically Signed   By: Ernie AvenaPalani  Rathinasamy M.D.   On: 04/12/2022 16:54   CT Head Wo Contrast  Result Date: 04/12/2022 CLINICAL DATA:  Mental status change EXAM: CT HEAD WITHOUT CONTRAST TECHNIQUE: Contiguous axial images were obtained from the base of the skull through the vertex without intravenous contrast. RADIATION DOSE  REDUCTION: This exam was performed according to the departmental dose-optimization program which includes automated exposure control, adjustment of the mA and/or kV according to patient size and/or use of iterative reconstruction technique. COMPARISON:  CT scan of the brain March 02, 2022 FINDINGS: Brain: No evidence of acute infarction, hemorrhage, hydrocephalus, extra-axial collection or mass lesion/mass effect. Vascular: Calcified atherosclerotic change in the intracranial carotids. Skull: Normal. Negative for fracture or focal lesion. Sinuses/Orbits: Mucosal thickening in scattered ethmoid air cells and sphenoid sinus. Other: None. IMPRESSION: No acute intracranial abnormalities.  Sinus disease as above. Electronically Signed   By: Gerome Sam III M.D.   On: 04/12/2022 15:46     Assessment and Plan: No notes have been filed under this hospital service. Service: Hospitalist  Principal Problem:   Acute encephalopathy Active Problems:   CHF (congestive heart failure) (HCC)   DM (diabetes mellitus) (HCC)   HTN (hypertension)   Class 3 obesity (HCC)    COVID-19 virus infection  Acute encephalopathy Cultures obtained This may be secondary to COVID-19 We will treat for UTI as the patient gets frequent UTIs Urine culture pending COVID-19 infection Starts Paxlovid Steroids not needed as patient does not appear to have respiratory problems.  If she starts to have cough and shortness of breath, will repeat chest x-ray and start dexamethasone Heart failure with preserved EF Continue Lasix Diabetes Sliding scale insulin Start Tradjenta Hypertension Continue home antihypertensives   Advance Care Planning:   Code Status: Full Code  Consults: none  Family Communication:   Severity of Illness: The appropriate patient status for this patient is INPATIENT. Inpatient status is judged to be reasonable and necessary in order to provide the required intensity of service to ensure the patient's safety. The patient's presenting symptoms, physical exam findings, and initial radiographic and laboratory data in the context of their chronic comorbidities is felt to place them at high risk for further clinical deterioration. Furthermore, it is not anticipated that the patient will be medically stable for discharge from the hospital within 2 midnights of admission.   * I certify that at the point of admission it is my clinical judgment that the patient will require inpatient hospital care spanning beyond 2 midnights from the point of admission due to high intensity of service, high risk for further deterioration and high frequency of surveillance required.*  Author: Levie Heritage, DO 04/12/2022 7:57 PM  For on call review [email or web address].

## 2022-04-12 NOTE — ED Triage Notes (Signed)
Patient brought in via EMS from [facility]. Patient aleter but confused x3. Patient brought in for altered mental status and fever. Per paramedic son stated patient was altered and is concerned about UTI. Paramedic unable to get any further information from son and staff unable to provide any further hx due to them being "new." Patient crying in pain in legs and headache. Patient blood sugar 146 in route. VS temp 100.3, HR 88, resp 20, unable to get blood pressure in route.

## 2022-04-12 NOTE — ED Notes (Signed)
Pt moved from ED stretcher to inpatient stretcher

## 2022-04-12 NOTE — ED Notes (Signed)
Herbert Seta, RN, [facility] informed this nurse that we have no more nirmatrelvir/ritonavir EUA (PAXLOVID)

## 2022-04-12 NOTE — ED Provider Notes (Signed)
[facility] EMERGENCY DEPARTMENT Provider Note   CSN: [id number] Arrival date & time: 04/12/22  1409     History {Add pertinent medical, surgical, social history, OB history to HPI:1} Chief Complaint  Patient presents with   Altered Mental Status    Crystal Moses is a 66 y.o. female presenting from nursing facility with fever, EMS.  Patient brought by EMS from nursing facility - concern for worsening mental status recently.  Pt reports headache, leg pain, but is poor historian.  Son had reported to EMS the patient has a history of UTIs and wondered about another UTI.  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  She also had debridement of the right shoulder.  Follows up with Dr. Sharol Given from orthopedics.  Patient appears to suffer from chronic leg pain was seen in the ED for this July 16.  At that time there is also concern for confusion.  She had blood work and CT scan of the head which was reassuring.  She had a urinalysis which ended up growing multiple species.  Her last positive urine culture was February 10, 2022 where she was growing E. coli that was sensitive to cephalosporins, and resistance only to gentamicin.  Son reports patient found her confused at facility  HPI     Home Medications Prior to Admission medications   Medication Sig Start Date End Date Taking? Authorizing Provider  acetaminophen (TYLENOL) 500 MG tablet Take 1,000 mg by mouth every 12 (twelve) hours as needed for fever or moderate pain. >100.1. Not to exceed 3 grams in 24 hours.    [provider]  ALPRAZolam Duanne Moron) 0.5 MG tablet Take 1 tablet (0.5 mg total) by mouth in the morning, at noon, and at bedtime. 02/21/22   Shelly Coss, MD  atorvastatin (LIPITOR) 80 MG tablet Take 80 mg  by mouth at bedtime. 10/16/20   [provider]  calcium carbonate (TUMS) 500 MG chewable tablet Chew 1 tablet by mouth every 8 (eight) hours as needed for heartburn.    [provider]  cefTRIAXone (ROCEPHIN) 10 g injection  02/28/22   [provider]  diclofenac Sodium (VOLTAREN) 1 % GEL Apply 1 Application topically 4 (four) times daily. 01/31/22   [provider]  divalproex (DEPAKOTE SPRINKLE) 125 MG capsule Take 250 mg by mouth 2 (two) times daily.    [provider]  escitalopram (LEXAPRO) 20 MG tablet Take 20 mg by mouth daily. 01/15/22   [provider]  folic acid (FOLVITE) 1 MG tablet Take 1 mg by mouth daily.    [provider]  gabapentin (NEURONTIN) 300 MG capsule Take 300-600 mg by mouth as directed. Take 1 capsule (300 mg) BID & Take 2 capsules (600 mg) at bedtime    [provider]  ipratropium-albuterol (DUONEB) 0.5-2.5 (3) MG/3ML SOLN Inhale 3 mLs into the lungs every 6 (six) hours as needed (sob/wheezing). 01/03/22   [provider]  lamoTRIgine (LAMICTAL) 25 MG tablet Take 50 mg by mouth daily. 03/28/22   [provider]  lidocaine (LIDODERM) 5 % Place 1 patch onto the skin daily. Remove & Discard patch within 12 hours or as directed by MD    [provider]  loperamide (IMODIUM A-D) 2 MG tablet Take 2 mg by mouth  daily as needed for diarrhea or loose stools.    [provider]  magnesium oxide (MAG-OX) 400 MG tablet Take 400 mg by mouth daily.    [provider]  melatonin 3 MG TABS tablet Take 6 mg by mouth at bedtime.    [provider]  meloxicam (MOBIC) 15 MG tablet Take 1 tablet (15 mg total) by mouth 2 (two) times daily as needed for pain. 02/21/22   Burnadette Pop, MD  metoprolol succinate (TOPROL-XL) 25 MG 24 hr tablet Take 1 tablet by mouth daily. 11/14/20   [provider]  mirtazapine (REMERON) 7.5 MG tablet Take 7.5 mg by mouth at bedtime.  03/28/22   [provider]  nystatin (MYCOSTATIN) 100000 UNIT/ML suspension Take by mouth. 04/04/22   [provider]  oxymetazoline (AFRIN) 0.05 % nasal spray Place 1 spray into both nostrils 2 (two) times daily.    [provider]  polyethylene glycol (MIRALAX / GLYCOLAX) 17 g packet Take 17 g by mouth daily as needed for moderate constipation.    [provider]  Potassium Chloride ER 20 MEQ TBCR Take 1 tablet by mouth daily. 01/20/22   [provider]  sodium chloride (OCEAN) 0.65 % nasal spray Place 1 spray into the nose every 2 (two) hours as needed for congestion.    [provider]  torsemide (DEMADEX) 20 MG tablet Take 40 mg by mouth in the morning.    [provider]  traMADol (ULTRAM) 50 MG tablet Take 1 tablet (50 mg total) by mouth every 6 (six) hours as needed for moderate pain. 02/21/22   Burnadette Pop, MD      Allergies    Patient has no known allergies.    Review of Systems   Review of Systems  Physical Exam Updated Vital Signs BP (!) 143/97 (BP Location: Left Arm)   Pulse 90   Temp (!) 102.5 F (39.2 C) (Rectal)   Resp 20   SpO2 94%  Physical Exam Constitutional:      General: She is not in acute distress.    Appearance: She is obese.  HENT:     Head: Normocephalic and atraumatic.  Eyes:     Conjunctiva/sclera: Conjunctivae normal.     Pupils: Pupils are equal, round, and reactive to light.  Cardiovascular:     Rate and Rhythm: Normal rate and regular rhythm.  Pulmonary:     Effort: Pulmonary effort is normal. No respiratory distress.  Abdominal:     General: There is no distension.     Tenderness: There is no abdominal tenderness.  Skin:    General: Skin is warm and dry.  Neurological:     General: No focal deficit present.     Mental Status: She is alert. Mental status is at baseline.     ED Results / Procedures / Treatments   Labs (all labs ordered are listed, but only abnormal results  are displayed) Labs Reviewed  SARS CORONAVIRUS 2 BY RT PCR - Abnormal; Notable for the following components:      Result Value   SARS Coronavirus 2 by RT PCR POSITIVE (*)    All other components within normal limits  COMPREHENSIVE METABOLIC PANEL - Abnormal; Notable for the following components:   Sodium 131 (*)    Chloride 95 (*)    Glucose, Bld 125 (*)    Creatinine, Ser 1.57 (*)    Calcium 8.6 (*)    Albumin 3.4 (*)    GFR, Estimated  36 (*)    All other components within normal limits  CBC WITH DIFFERENTIAL/PLATELET - Abnormal; Notable for the following components:   RBC 3.63 (*)    Hemoglobin 10.4 (*)    HCT 34.2 (*)    RDW 16.1 (*)    All other components within normal limits  URINALYSIS, ROUTINE W REFLEX MICROSCOPIC - Abnormal; Notable for the following components:   APPearance HAZY (*)    Hgb urine dipstick SMALL (*)    Leukocytes,Ua LARGE (*)    Non Squamous Epithelial 0-5 (*)    All other components within normal limits  CULTURE, BLOOD (ROUTINE X 2)  CULTURE, BLOOD (ROUTINE X 2)  URINE CULTURE  LACTIC ACID, PLASMA  PROTIME-INR  APTT    EKG EKG Interpretation  Date/Time:  Saturday April 12 2022 15:13:47 EDT Ventricular Rate:  88 PR Interval:  140 QRS Duration: 122 QT Interval:  400 QTC Calculation: 484 R Axis:   33 Text Interpretation: Normal sinus rhythm Right bundle branch block Abnormal ECG When compared with ECG of 02-Mar-2022 00:05, PREVIOUS ECG IS PRESENT Confirmed by Octaviano Glow [phone]) on 04/12/2022 3:19:17 PM  Radiology DG Chest Port 1 View  Result Date: 04/12/2022 CLINICAL DATA:  Altered mental status, fever EXAM: PORTABLE CHEST 1 VIEW COMPARISON:  03/01/2022 FINDINGS: Transverse diameter of heart is slightly increased. This may be partly due to poor inspiration. Linear densities are seen in left lower lung field. Left hemidiaphragm is elevated. There are no signs of alveolar pulmonary edema or focal consolidation. There is no pleural effusion  or pneumothorax. Proximal right humerus is not seen suggesting previous removal. There is surgical fusion in cervical spine. IMPRESSION: Linear densities in left lower lung fields suggest scarring or subsegmental atelectasis. There are no signs of pulmonary edema or focal pulmonary consolidation. Electronically Signed   By: Elmer Picker M.D.   On: 04/12/2022 16:54   CT Head Wo Contrast  Result Date: 04/12/2022 CLINICAL DATA:  Mental status change EXAM: CT HEAD WITHOUT CONTRAST TECHNIQUE: Contiguous axial images were obtained from the base of the skull through the vertex without intravenous contrast. RADIATION DOSE REDUCTION: This exam was performed according to the departmental dose-optimization program which includes automated exposure control, adjustment of the mA and/or kV according to patient size and/or use of iterative reconstruction technique. COMPARISON:  CT scan of the brain March 02, 2022 FINDINGS: Brain: No evidence of acute infarction, hemorrhage, hydrocephalus, extra-axial collection or mass lesion/mass effect. Vascular: Calcified atherosclerotic change in the intracranial carotids. Skull: Normal. Negative for fracture or focal lesion. Sinuses/Orbits: Mucosal thickening in scattered ethmoid air cells and sphenoid sinus. Other: None. IMPRESSION: No acute intracranial abnormalities.  Sinus disease as above. Electronically Signed   By: Dorise Bullion III M.D.   On: 04/12/2022 15:46    Procedures Procedures  {Document cardiac monitor, telemetry assessment procedure when appropriate:1}  Medications Ordered in ED Medications  cefTRIAXone (ROCEPHIN) 1 g in sodium chloride 0.9 % 100 mL IVPB (has no administration in time range)  sodium chloride 0.9 % bolus 1,000 mL (has no administration in time range)  acetaminophen (TYLENOL) suppository 650 mg (650 mg Rectal Given 04/12/22 1558)    ED Course/ Medical Decision Making/ A&P Clinical Course as of 04/12/22 1742  Sat Apr 12, 2022  1553  Rectal temp 102F [MT]    Clinical Course User Index [MT] Wyvonnia Dusky, MD  Medical Decision Making Amount and/or Complexity of Data Reviewed Labs: ordered. Radiology: ordered. ECG/medicine tests: ordered.  Risk OTC drugs.   This patient presents to the ED with concern for fever, altered mental status. This involves an extensive number of treatment options, and is a complaint that carries with it a high risk of complications and morbidity.  The differential diagnosis includes fracture including UTI versus sepsis versus COVID viral illness versus other  There is no report of trauma, patient is bedbound.  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  The pertinent results include:  ***  I ordered imaging studies including x-ray of the chest, CT of the head I independently visualized and interpreted imaging which showed *** I agree with the radiologist interpretation  The patient was maintained on a cardiac monitor.  I personally viewed and interpreted the cardiac monitored which showed an underlying rhythm of: ***  Per my interpretation the patient's ECG shows ***  I ordered medication including ***  for ***  I have reviewed the patients home medicines and have made adjustments as needed  Test Considered: ***  I requested consultation with the ***,  and discussed lab and imaging findings as well as pertinent plan - they recommend: ***  After the interventions noted above, I reevaluated the patient and found that they have:  {resolved/improved/worsened:23923::"improved"}  Social Determinants of Health:***  Dispostion:  After consideration of the diagnostic results and the patients response to treatment, I feel that the patent would benefit from ***.   {Document critical care time when appropriate:1} {Document review of labs and clinical decision tools ie heart score, Chads2Vasc2 etc:1}  {Document your independent review of radiology images, and any outside records:1} {Document your discussion with family members, caretakers, and with consultants:1} {Document social determinants of health affecting pt's care:1} {Document your decision making why or why not admission, treatments were needed:1} Final Clinical Impression(s) / ED Diagnoses Final diagnoses:  None    Rx / DC Orders ED Discharge Orders     None

## 2022-04-12 NOTE — ED Notes (Signed)
Dr Renaye Rakers made aware of rectal temp of 101.6

## 2022-04-13 DIAGNOSIS — G9340 Encephalopathy, unspecified: Secondary | ICD-10-CM | POA: Diagnosis not present

## 2022-04-13 LAB — CBC WITH DIFFERENTIAL/PLATELET
Abs Immature Granulocytes: 0.04 10*3/uL (ref 0.00–0.07)
Basophils Absolute: 0.1 10*3/uL (ref 0.0–0.1)
Basophils Relative: 1 %
Eosinophils Absolute: 0.1 10*3/uL (ref 0.0–0.5)
Eosinophils Relative: 1 %
HCT: 32 % — ABNORMAL LOW (ref 36.0–46.0)
Hemoglobin: 9.7 g/dL — ABNORMAL LOW (ref 12.0–15.0)
Immature Granulocytes: 1 %
Lymphocytes Relative: 16 %
Lymphs Abs: 1.3 10*3/uL (ref 0.7–4.0)
MCH: 28.6 pg (ref 26.0–34.0)
MCHC: 30.3 g/dL (ref 30.0–36.0)
MCV: 94.4 fL (ref 80.0–100.0)
Monocytes Absolute: 0.8 10*3/uL (ref 0.1–1.0)
Monocytes Relative: 11 %
Neutro Abs: 5.6 10*3/uL (ref 1.7–7.7)
Neutrophils Relative %: 70 %
Platelets: 217 10*3/uL (ref 150–400)
RBC: 3.39 MIL/uL — ABNORMAL LOW (ref 3.87–5.11)
RDW: 16.4 % — ABNORMAL HIGH (ref 11.5–15.5)
WBC: 7.8 10*3/uL (ref 4.0–10.5)
nRBC: 0 % (ref 0.0–0.2)

## 2022-04-13 LAB — COMPREHENSIVE METABOLIC PANEL
ALT: 9 U/L (ref 0–44)
AST: 18 U/L (ref 15–41)
Albumin: 2.9 g/dL — ABNORMAL LOW (ref 3.5–5.0)
Alkaline Phosphatase: 53 U/L (ref 38–126)
Anion gap: 8 (ref 5–15)
BUN: 22 mg/dL (ref 8–23)
CO2: 25 mmol/L (ref 22–32)
Calcium: 8.4 mg/dL — ABNORMAL LOW (ref 8.9–10.3)
Chloride: 103 mmol/L (ref 98–111)
Creatinine, Ser: 1.4 mg/dL — ABNORMAL HIGH (ref 0.44–1.00)
GFR, Estimated: 41 mL/min — ABNORMAL LOW (ref >60)
Glucose, Bld: 103 mg/dL — ABNORMAL HIGH (ref 70–99)
Potassium: 3.5 mmol/L (ref 3.5–5.1)
Sodium: 136 mmol/L (ref 135–145)
Total Bilirubin: 0.6 mg/dL (ref 0.3–1.2)
Total Protein: 6.9 g/dL (ref 6.5–8.1)

## 2022-04-13 LAB — GLUCOSE, CAPILLARY
Glucose-Capillary: 100 mg/dL — ABNORMAL HIGH (ref 70–99)
Glucose-Capillary: 103 mg/dL — ABNORMAL HIGH (ref 70–99)
Glucose-Capillary: 109 mg/dL — ABNORMAL HIGH (ref 70–99)
Glucose-Capillary: 143 mg/dL — ABNORMAL HIGH (ref 70–99)
Glucose-Capillary: 59 mg/dL — ABNORMAL LOW (ref 70–99)

## 2022-04-13 LAB — TSH: TSH: 0.748 u[IU]/mL (ref 0.350–4.500)

## 2022-04-13 LAB — FERRITIN: Ferritin: 35 ng/mL (ref 11–307)

## 2022-04-13 LAB — C-REACTIVE PROTEIN: CRP: 5 mg/dL — ABNORMAL HIGH (ref <1.0)

## 2022-04-13 LAB — AMMONIA: Ammonia: 16 umol/L (ref 9–35)

## 2022-04-13 LAB — D-DIMER, QUANTITATIVE: D-Dimer, Quant: 2.35 ug/mL-FEU — ABNORMAL HIGH (ref 0.00–0.50)

## 2022-04-13 LAB — MRSA NEXT GEN BY PCR, NASAL: MRSA by PCR Next Gen: DETECTED — AB

## 2022-04-13 MED ORDER — DEXTROSE 50 % IV SOLN
INTRAVENOUS | Status: AC
  Administered 2022-04-13: 50 mL
  Filled 2022-04-13: qty 50

## 2022-04-13 MED ORDER — SODIUM CHLORIDE 0.9 % IV BOLUS
500.0000 mL | Freq: Once | INTRAVENOUS | Status: AC
  Administered 2022-04-13: 500 mL via INTRAVENOUS

## 2022-04-13 MED ORDER — CHLORHEXIDINE GLUCONATE CLOTH 2 % EX PADS
6.0000 | MEDICATED_PAD | Freq: Every day | CUTANEOUS | Status: DC
  Administered 2022-04-14 – 2022-04-15 (×2): 6 via TOPICAL

## 2022-04-13 NOTE — Plan of Care (Signed)
Alert with confusion, not ambulatory from home with husband. Chest xray and UA neg. Continue to monitor. Problem: Education: Goal: Knowledge of risk factors and measures for prevention of condition will improve 04/13/2022 1755 by Larose Kells, RN Outcome: Progressing 04/13/2022 1751 by Larose Kells, RN Outcome: Progressing 04/13/2022 1748 by Larose Kells, RN Outcome: Progressing   Problem: Coping: Goal: Psychosocial and spiritual needs will be supported 04/13/2022 1755 by Larose Kells, RN Outcome: Progressing 04/13/2022 1751 by Larose Kells, RN Outcome: Progressing 04/13/2022 1748 by Larose Kells, RN Outcome: Progressing   Problem: Respiratory: Goal: Will maintain a patent airway 04/13/2022 1755 by Larose Kells, RN Outcome: Progressing 04/13/2022 1751 by Larose Kells, RN Outcome: Progressing 04/13/2022 1748 by Larose Kells, RN Outcome: Progressing Goal: Complications related to the disease process, condition or treatment will be avoided or minimized 04/13/2022 1755 by Larose Kells, RN Outcome: Progressing 04/13/2022 1751 by Larose Kells, RN Outcome: Progressing 04/13/2022 1748 by Larose Kells, RN Outcome: Progressing   Problem: Education: Goal: Ability to describe self-care measures that may prevent or decrease complications (Diabetes Survival Skills Education) will improve 04/13/2022 1755 by Larose Kells, RN Outcome: Progressing 04/13/2022 1751 by Larose Kells, RN Outcome: Progressing 04/13/2022 1748 by Larose Kells, RN Outcome: Progressing Goal: Individualized Educational Video(s) 04/13/2022 1755 by Larose Kells, RN Outcome: Progressing 04/13/2022 1751 by Larose Kells, RN Outcome: Progressing 04/13/2022 1748 by Larose Kells, RN Outcome: Progressing   Problem: Coping: Goal: Ability to adjust to condition or change in health will improve 04/13/2022 1755 by Larose Kells, RN Outcome: Progressing 04/13/2022 1751 by Larose Kells, RN Outcome: Progressing 04/13/2022 1748 by Larose Kells, RN Outcome: Progressing   Problem: Fluid Volume: Goal: Ability to maintain a balanced intake and output will improve 04/13/2022 1755 by Larose Kells, RN Outcome: Progressing 04/13/2022 1751 by Larose Kells, RN Outcome: Progressing 04/13/2022 1748 by Larose Kells, RN Outcome: Progressing   Problem: Health Behavior/Discharge Planning: Goal: Ability to identify and utilize available resources and services will improve 04/13/2022 1755 by Larose Kells, RN Outcome: Progressing 04/13/2022 1751 by Larose Kells, RN Outcome: Progressing 04/13/2022 1748 by Larose Kells, RN Outcome: Progressing Goal: Ability to manage health-related needs will improve 04/13/2022 1755 by Larose Kells, RN Outcome: Progressing 04/13/2022 1751 by Larose Kells, RN Outcome: Progressing 04/13/2022 1748 by Larose Kells, RN Outcome: Progressing   Problem: Metabolic: Goal: Ability to maintain appropriate glucose levels will improve 04/13/2022 1755 by Larose Kells, RN Outcome: Progressing 04/13/2022 1751 by Larose Kells, RN Outcome: Progressing 04/13/2022 1748 by Larose Kells, RN Outcome: Progressing   Problem: Nutritional: Goal: Maintenance of adequate nutrition will improve 04/13/2022 1755 by Larose Kells, RN Outcome: Progressing 04/13/2022 1751 by Larose Kells, RN Outcome: Progressing 04/13/2022 1748 by Larose Kells, RN Outcome: Progressing Goal: Progress toward achieving an optimal weight will improve 04/13/2022 1755 by Larose Kells, RN Outcome: Progressing 04/13/2022 1751 by Larose Kells, RN Outcome: Progressing 04/13/2022 1748 by Larose Kells, RN Outcome: Progressing   Problem: Skin Integrity: Goal: Risk for impaired skin integrity will decrease 04/13/2022 1755 by Larose Kells, RN Outcome: Progressing 04/13/2022 1751 by Larose Kells, RN Outcome: Progressing 04/13/2022 1748 by Larose Kells, RN Outcome: Progressing   Problem: Tissue Perfusion: Goal: Adequacy of tissue perfusion will improve 04/13/2022 1755 by Larose Kells, RN Outcome: Progressing  04/13/2022 1751 by Larose Kells, RN Outcome: Progressing 04/13/2022 1748 by Larose Kells, RN Outcome: Progressing   Problem: Education: Goal: Knowledge of General Education information will improve Description: Including pain rating scale, medication(s)/side effects and non-pharmacologic comfort measures 04/13/2022 1755 by Larose Kells, RN Outcome: Progressing 04/13/2022 1751 by Larose Kells, RN Outcome: Progressing 04/13/2022 1748 by Larose Kells, RN Outcome: Progressing   Problem: Health Behavior/Discharge Planning: Goal: Ability to manage health-related needs will improve 04/13/2022 1755 by Larose Kells, RN Outcome: Progressing 04/13/2022 1751 by Larose Kells, RN Outcome: Progressing 04/13/2022 1748 by Larose Kells, RN Outcome: Progressing   Problem: Clinical Measurements: Goal: Ability to maintain clinical measurements within normal limits will improve 04/13/2022 1755 by Larose Kells, RN Outcome: Progressing 04/13/2022 1751 by Larose Kells, RN Outcome: Progressing 04/13/2022 1748 by Larose Kells, RN Outcome: Progressing Goal: Will remain free from infection 04/13/2022 1755 by Larose Kells, RN Outcome: Progressing 04/13/2022 1751 by Larose Kells, RN Outcome: Progressing 04/13/2022 1748 by Larose Kells, RN Outcome: Progressing Goal: Diagnostic test results will improve 04/13/2022 1755 by Larose Kells, RN Outcome: Progressing 04/13/2022 1751 by Larose Kells, RN Outcome: Progressing 04/13/2022 1748 by Larose Kells, RN Outcome: Progressing Goal: Respiratory complications will improve 04/13/2022 1755 by Larose Kells, RN Outcome: Progressing 04/13/2022 1751 by Larose Kells, RN Outcome: Progressing 04/13/2022 1748 by Larose Kells, RN Outcome: Progressing Goal:  Cardiovascular complication will be avoided 04/13/2022 1755 by Larose Kells, RN Outcome: Progressing 04/13/2022 1751 by Larose Kells, RN Outcome: Progressing 04/13/2022 1748 by Larose Kells, RN Outcome: Progressing   Problem: Activity: Goal: Risk for activity intolerance will decrease 04/13/2022 1755 by Larose Kells, RN Outcome: Progressing 04/13/2022 1751 by Larose Kells, RN Outcome: Progressing 04/13/2022 1748 by Larose Kells, RN Outcome: Progressing   Problem: Nutrition: Goal: Adequate nutrition will be maintained 04/13/2022 1755 by Larose Kells, RN Outcome: Progressing 04/13/2022 1751 by Larose Kells, RN Outcome: Progressing 04/13/2022 1748 by Larose Kells, RN Outcome: Progressing   Problem: Coping: Goal: Level of anxiety will decrease 04/13/2022 1755 by Larose Kells, RN Outcome: Progressing 04/13/2022 1751 by Larose Kells, RN Outcome: Progressing 04/13/2022 1748 by Larose Kells, RN Outcome: Progressing   Problem: Elimination: Goal: Will not experience complications related to bowel motility 04/13/2022 1755 by Larose Kells, RN Outcome: Progressing 04/13/2022 1751 by Larose Kells, RN Outcome: Progressing 04/13/2022 1748 by Larose Kells, RN Outcome: Progressing Goal: Will not experience complications related to urinary retention 04/13/2022 1755 by Larose Kells, RN Outcome: Progressing 04/13/2022 1751 by Larose Kells, RN Outcome: Progressing 04/13/2022 1748 by Larose Kells, RN Outcome: Progressing   Problem: Pain Managment: Goal: General experience of comfort will improve 04/13/2022 1755 by Larose Kells, RN Outcome: Progressing 04/13/2022 1751 by Larose Kells, RN Outcome: Progressing 04/13/2022 1748 by Larose Kells, RN Outcome: Progressing   Problem: Safety: Goal: Ability to remain free from injury will improve 04/13/2022 1755 by Larose Kells, RN Outcome: Progressing 04/13/2022 1751 by Larose Kells,  RN Outcome: Progressing 04/13/2022 1748 by Larose Kells, RN Outcome: Progressing   Problem: Skin Integrity: Goal: Risk for impaired skin integrity will decrease 04/13/2022 1755 by Larose Kells, RN Outcome: Progressing 04/13/2022 1751 by Larose Kells, RN Outcome: Progressing 04/13/2022 1748 by Larose Kells, RN Outcome: Progressing

## 2022-04-13 NOTE — TOC Progression Note (Signed)
  Transition of Care (TOC) Screening Note   Patient Details  Name: Crystal Moses Date of Birth: [birth year]   [facility]) CM/SW Contact:    Leitha Bleak, RN Phone Number: 04/13/2022, 1:49 PM    Transition of Care Department [facility]) has reviewed patient and no TOC needs have been identified at this time. We will continue to monitor patient advancement through interdisciplinary progression rounds. If new patient transition needs arise, please place a TOC consult.      Barriers to Discharge: Continued Medical Work up

## 2022-04-13 NOTE — Plan of Care (Signed)
A&O up to bed side commode with easy assist. Plan is for cardiology consult tomorrow and possible SNF. Continue to monitor. Problem: Education: Goal: Knowledge of risk factors and measures for prevention of condition will improve 04/13/2022 1751 by Larose Kells, RN Outcome: Progressing 04/13/2022 1748 by Larose Kells, RN Outcome: Progressing   Problem: Coping: Goal: Psychosocial and spiritual needs will be supported 04/13/2022 1751 by Larose Kells, RN Outcome: Progressing 04/13/2022 1748 by Larose Kells, RN Outcome: Progressing   Problem: Respiratory: Goal: Will maintain a patent airway 04/13/2022 1751 by Larose Kells, RN Outcome: Progressing 04/13/2022 1748 by Larose Kells, RN Outcome: Progressing Goal: Complications related to the disease process, condition or treatment will be avoided or minimized 04/13/2022 1751 by Larose Kells, RN Outcome: Progressing 04/13/2022 1748 by Larose Kells, RN Outcome: Progressing   Problem: Education: Goal: Ability to describe self-care measures that may prevent or decrease complications (Diabetes Survival Skills Education) will improve 04/13/2022 1751 by Larose Kells, RN Outcome: Progressing 04/13/2022 1748 by Larose Kells, RN Outcome: Progressing Goal: Individualized Educational Video(s) 04/13/2022 1751 by Larose Kells, RN Outcome: Progressing 04/13/2022 1748 by Larose Kells, RN Outcome: Progressing   Problem: Coping: Goal: Ability to adjust to condition or change in health will improve 04/13/2022 1751 by Larose Kells, RN Outcome: Progressing 04/13/2022 1748 by Larose Kells, RN Outcome: Progressing   Problem: Fluid Volume: Goal: Ability to maintain a balanced intake and output will improve 04/13/2022 1751 by Larose Kells, RN Outcome: Progressing 04/13/2022 1748 by Larose Kells, RN Outcome: Progressing   Problem: Health Behavior/Discharge Planning: Goal: Ability to identify and utilize available  resources and services will improve 04/13/2022 1751 by Larose Kells, RN Outcome: Progressing 04/13/2022 1748 by Larose Kells, RN Outcome: Progressing Goal: Ability to manage health-related needs will improve 04/13/2022 1751 by Larose Kells, RN Outcome: Progressing 04/13/2022 1748 by Larose Kells, RN Outcome: Progressing   Problem: Metabolic: Goal: Ability to maintain appropriate glucose levels will improve 04/13/2022 1751 by Larose Kells, RN Outcome: Progressing 04/13/2022 1748 by Larose Kells, RN Outcome: Progressing   Problem: Nutritional: Goal: Maintenance of adequate nutrition will improve 04/13/2022 1751 by Larose Kells, RN Outcome: Progressing 04/13/2022 1748 by Larose Kells, RN Outcome: Progressing Goal: Progress toward achieving an optimal weight will improve 04/13/2022 1751 by Larose Kells, RN Outcome: Progressing 04/13/2022 1748 by Larose Kells, RN Outcome: Progressing   Problem: Skin Integrity: Goal: Risk for impaired skin integrity will decrease 04/13/2022 1751 by Larose Kells, RN Outcome: Progressing 04/13/2022 1748 by Larose Kells, RN Outcome: Progressing   Problem: Tissue Perfusion: Goal: Adequacy of tissue perfusion will improve 04/13/2022 1751 by Larose Kells, RN Outcome: Progressing 04/13/2022 1748 by Larose Kells, RN Outcome: Progressing   Problem: Education: Goal: Knowledge of General Education information will improve Description: Including pain rating scale, medication(s)/side effects and non-pharmacologic comfort measures 04/13/2022 1751 by Larose Kells, RN Outcome: Progressing 04/13/2022 1748 by Larose Kells, RN Outcome: Progressing   Problem: Health Behavior/Discharge Planning: Goal: Ability to manage health-related needs will improve 04/13/2022 1751 by Larose Kells, RN Outcome: Progressing 04/13/2022 1748 by Larose Kells, RN Outcome: Progressing   Problem: Clinical Measurements: Goal: Ability to  maintain clinical measurements within normal limits will improve 04/13/2022 1751 by Larose Kells, RN Outcome: Progressing 04/13/2022 1748 by Larose Kells, RN Outcome: Progressing Goal: Will remain free from infection  04/13/2022 1751 by Larose Kells, RN Outcome: Progressing 04/13/2022 1748 by Larose Kells, RN Outcome: Progressing Goal: Diagnostic test results will improve 04/13/2022 1751 by Larose Kells, RN Outcome: Progressing 04/13/2022 1748 by Larose Kells, RN Outcome: Progressing Goal: Respiratory complications will improve 04/13/2022 1751 by Larose Kells, RN Outcome: Progressing 04/13/2022 1748 by Larose Kells, RN Outcome: Progressing Goal: Cardiovascular complication will be avoided 04/13/2022 1751 by Larose Kells, RN Outcome: Progressing 04/13/2022 1748 by Larose Kells, RN Outcome: Progressing   Problem: Activity: Goal: Risk for activity intolerance will decrease 04/13/2022 1751 by Larose Kells, RN Outcome: Progressing 04/13/2022 1748 by Larose Kells, RN Outcome: Progressing   Problem: Nutrition: Goal: Adequate nutrition will be maintained 04/13/2022 1751 by Larose Kells, RN Outcome: Progressing 04/13/2022 1748 by Larose Kells, RN Outcome: Progressing   Problem: Coping: Goal: Level of anxiety will decrease 04/13/2022 1751 by Larose Kells, RN Outcome: Progressing 04/13/2022 1748 by Larose Kells, RN Outcome: Progressing   Problem: Elimination: Goal: Will not experience complications related to bowel motility 04/13/2022 1751 by Larose Kells, RN Outcome: Progressing 04/13/2022 1748 by Larose Kells, RN Outcome: Progressing Goal: Will not experience complications related to urinary retention 04/13/2022 1751 by Larose Kells, RN Outcome: Progressing 04/13/2022 1748 by Larose Kells, RN Outcome: Progressing   Problem: Pain Managment: Goal: General experience of comfort will improve 04/13/2022 1751 by Larose Kells,  RN Outcome: Progressing 04/13/2022 1748 by Larose Kells, RN Outcome: Progressing   Problem: Safety: Goal: Ability to remain free from injury will improve 04/13/2022 1751 by Larose Kells, RN Outcome: Progressing 04/13/2022 1748 by Larose Kells, RN Outcome: Progressing   Problem: Skin Integrity: Goal: Risk for impaired skin integrity will decrease 04/13/2022 1751 by Larose Kells, RN Outcome: Progressing 04/13/2022 1748 by Larose Kells, RN Outcome: Progressing

## 2022-04-13 NOTE — Plan of Care (Signed)
Patient A&O talked with son this morning and updated. She has been crying most of the day very anxious, + for Covid & MRSA on IV antibiotics. States she can feed self but only eats when this nurse or tech feeds her. Continue to monitor.  Problem: Education: Goal: Knowledge of risk factors and measures for prevention of condition will improve Outcome: Progressing   Problem: Coping: Goal: Psychosocial and spiritual needs will be supported Outcome: Progressing   Problem: Respiratory: Goal: Will maintain a patent airway Outcome: Progressing Goal: Complications related to the disease process, condition or treatment will be avoided or minimized Outcome: Progressing   Problem: Education: Goal: Ability to describe self-care measures that may prevent or decrease complications (Diabetes Survival Skills Education) will improve Outcome: Progressing Goal: Individualized Educational Video(s) Outcome: Progressing   Problem: Coping: Goal: Ability to adjust to condition or change in health will improve Outcome: Progressing   Problem: Fluid Volume: Goal: Ability to maintain a balanced intake and output will improve Outcome: Progressing   Problem: Health Behavior/Discharge Planning: Goal: Ability to identify and utilize available resources and services will improve Outcome: Progressing Goal: Ability to manage health-related needs will improve Outcome: Progressing   Problem: Metabolic: Goal: Ability to maintain appropriate glucose levels will improve Outcome: Progressing   Problem: Nutritional: Goal: Maintenance of adequate nutrition will improve Outcome: Progressing Goal: Progress toward achieving an optimal weight will improve Outcome: Progressing   Problem: Skin Integrity: Goal: Risk for impaired skin integrity will decrease Outcome: Progressing   Problem: Tissue Perfusion: Goal: Adequacy of tissue perfusion will improve Outcome: Progressing   Problem: Education: Goal: Knowledge  of General Education information will improve Description: Including pain rating scale, medication(s)/side effects and non-pharmacologic comfort measures Outcome: Progressing   Problem: Health Behavior/Discharge Planning: Goal: Ability to manage health-related needs will improve Outcome: Progressing   Problem: Clinical Measurements: Goal: Ability to maintain clinical measurements within normal limits will improve Outcome: Progressing Goal: Will remain free from infection Outcome: Progressing Goal: Diagnostic test results will improve Outcome: Progressing Goal: Respiratory complications will improve Outcome: Progressing Goal: Cardiovascular complication will be avoided Outcome: Progressing   Problem: Activity: Goal: Risk for activity intolerance will decrease Outcome: Progressing   Problem: Nutrition: Goal: Adequate nutrition will be maintained Outcome: Progressing   Problem: Coping: Goal: Level of anxiety will decrease Outcome: Progressing   Problem: Elimination: Goal: Will not experience complications related to bowel motility Outcome: Progressing Goal: Will not experience complications related to urinary retention Outcome: Progressing   Problem: Pain Managment: Goal: General experience of comfort will improve Outcome: Progressing   Problem: Safety: Goal: Ability to remain free from injury will improve Outcome: Progressing   Problem: Skin Integrity: Goal: Risk for impaired skin integrity will decrease Outcome: Progressing

## 2022-04-13 NOTE — Progress Notes (Signed)
PROGRESS NOTE    Crystal Moses  ZMO:[id number] DOB: [birth year] DOA: 04/12/2022 PCP: Charlynne Pander, MD   Brief Narrative:    Crystal Moses is a 66 y.o. female with medical history significant of diabetes, heart failure with preserved EF, hypertension.  Patient is a resident of [facility] rehab.  For the past day, the patient has been increased confused and with a fever.  She was admitted for acute metabolic encephalopathy in the setting of COVID-19 infection as well as concern for UTI she has been started on treatment with Paxlovid as well as Rocephin empirically.  Assessment & Plan:   Principal Problem:   Acute encephalopathy Active Problems:   CHF (congestive heart failure) (HCC)   DM (diabetes mellitus) (HCC)   HTN (hypertension)   Class 3 obesity (HCC)   COVID-19 virus infection  Assessment and Plan:   Acute metabolic encephalopathy possibly secondary to COVID-19 infection as well as UTI Cultures obtained This may be secondary to COVID-19 We will treat for UTI as the patient gets frequent UTIs Urine culture pending Obtain TSH and ammonia COVID-19 infection Continue Paxlovid Steroids not needed as patient does not appear to have respiratory problems.  If she starts to have cough and shortness of breath, will repeat chest x-ray and start dexamethasone No prior vaccines Heart failure with preserved EF Continue Lasix Diabetes Sliding scale insulin Start Tradjenta Hypertension Continue home antihypertensives Morbid obesity BMI 44.65   DVT prophylaxis: Lovenox Code Status: Full Family Communication: Discussed with son on phone 8/27 Disposition Plan:  Status is: Inpatient Remains inpatient appropriate because: Ongoing confusion with need for IV medications.   Consultants:  None  Procedures:  None  Antimicrobials:  Anti-infectives (From admission, onward)    Start     Dose/Rate Route Frequency Ordered Stop   04/12/22 2200  nirmatrelvir/ritonavir EUA (renal  dosing) (PAXLOVID) 2 tablet        2 tablet Oral 2 times daily 04/12/22 1949 04/17/22 2159   04/12/22 1745  cefTRIAXone (ROCEPHIN) 1 g in sodium chloride 0.9 % 100 mL IVPB        1 g 200 mL/hr over 30 Minutes Intravenous Every 24 hours 04/12/22 1730         Subjective: Patient seen and evaluated today and appears quite confused, denies any complaints.  Objective: Vitals:   04/13/22 0350 04/13/22 0644 04/13/22 0756 04/13/22 1026  BP: (!) 142/62 136/70 (!) 125/52   Pulse: 74 77 79   Resp: 19 18 18    Temp: 99.4 F (37.4 C) 98.3 F (36.8 C) 99.5 F (37.5 C) 98.8 F (37.1 C)  TempSrc: Oral  Oral Axillary  SpO2: 96% 97% 100%   Weight:      Height:        Intake/Output Summary (Last 24 hours) at 04/13/2022 1112 Last data filed at 04/13/2022 0405 Gross per 24 hour  Intake 1149.42 ml  Output --  Net 1149.42 ml   Filed Weights   04/12/22 2030 04/12/22 2347  Weight: 113 kg 107.2 kg    Examination:  General exam: Appears calm and comfortable, confused, obese Respiratory system: Clear to auscultation. Respiratory effort normal.  Currently on room air Cardiovascular system: S1 & S2 heard, RRR.  Gastrointestinal system: Abdomen is soft Central nervous system: Alert and awake Extremities: No edema Skin: No significant lesions noted Psychiatry: Flat affect.    Data Reviewed: I have personally reviewed following labs and imaging studies  CBC: Recent Labs  Lab 04/12/22 1500 04/13/22 0536  WBC  9.1 7.8  NEUTROABS 7.3 5.6  HGB 10.4* 9.7*  HCT 34.2* 32.0*  MCV 94.2 94.4  PLT 263 217   Basic Metabolic Panel: Recent Labs  Lab 04/12/22 1500 04/13/22 0536  NA 131* 136  K 4.1 3.5  CL 95* 103  CO2 26 25  GLUCOSE 125* 103*  BUN 23 22  CREATININE 1.57* 1.40*  CALCIUM 8.6* 8.4*   GFR: Estimated Creatinine Clearance: 44.1 mL/min (A) (by C-G formula based on SCr of 1.4 mg/dL (H)). Liver Function Tests: Recent Labs  Lab 04/12/22 1500 04/13/22 0536  AST 19 18  ALT  11 9  ALKPHOS 58 53  BILITOT 0.6 0.6  PROT 8.0 6.9  ALBUMIN 3.4* 2.9*   No results for input(s): "LIPASE", "AMYLASE" in the last 168 hours. No results for input(s): "AMMONIA" in the last 168 hours. Coagulation Profile: Recent Labs  Lab 04/12/22 1500  INR 1.0   Cardiac Enzymes: No results for input(s): "CKTOTAL", "CKMB", "CKMBINDEX", "TROPONINI" in the last 168 hours. BNP (last 3 results) No results for input(s): "PROBNP" in the last 8760 hours. HbA1C: Recent Labs    04/12/22 1511  HGBA1C 5.5   CBG: Recent Labs  Lab 04/12/22 2124 04/12/22 2209 04/13/22 0827  GLUCAP 108* 106* 109*   Lipid Profile: No results for input(s): "CHOL", "HDL", "LDLCALC", "TRIG", "CHOLHDL", "LDLDIRECT" in the last 72 hours. Thyroid Function Tests: No results for input(s): "TSH", "T4TOTAL", "FREET4", "T3FREE", "THYROIDAB" in the last 72 hours. Anemia Panel: Recent Labs    04/12/22 1600 04/13/22 0536  FERRITIN 23 35   Sepsis Labs: Recent Labs  Lab 04/12/22 1500  PROCALCITON <0.10  LATICACIDVEN 1.1    Recent Results (from the past 240 hour(s))  Blood Culture (routine x 2)     Status: None (Preliminary result)   Collection Time: 04/12/22  3:00 PM   Specimen: BLOOD LEFT FOREARM  Result Value Ref Range Status   Specimen Description   Final    BLOOD LEFT FOREARM BOTTLES DRAWN AEROBIC AND ANAEROBIC   Special Requests   Final    Blood Culture results may not be optimal due to an excessive volume of blood received in culture bottles   Culture   Final    NO GROWTH < 12 HOURS Performed at [facility], [address], [place], [place] [address]    Report Status PENDING  Incomplete  Blood Culture (routine x 2)     Status: None (Preliminary result)   Collection Time: 04/12/22  3:00 PM   Specimen: Right Antecubital; Blood  Result Value Ref Range Status   Specimen Description   Final    RIGHT ANTECUBITAL BOTTLES DRAWN AEROBIC AND ANAEROBIC   Special Requests   Final    Blood Culture  results may not be optimal due to an excessive volume of blood received in culture bottles   Culture   Final    NO GROWTH < 12 HOURS Performed at [facility], [address] [place], [place] [address]    Report Status PENDING  Incomplete  SARS Coronavirus 2 by RT PCR (hospital order, performed in [facility] hospital lab) *cepheid single result test* Anterior Nasal Swab     Status: Abnormal   Collection Time: 04/12/22  3:10 PM   Specimen: Anterior Nasal Swab  Result Value Ref Range Status   SARS Coronavirus 2 by RT PCR POSITIVE (A) NEGATIVE Final    Comment: (NOTE) SARS-CoV-2 target nucleic acids are DETECTED  SARS-CoV-2 RNA is generally detectable in upper respiratory specimens  during the acute phase of infection.  Positive results are indicative  of the presence of the identified virus, but do not rule out bacterial infection or co-infection with other pathogens not detected by the test.  Clinical correlation with patient history and  other diagnostic information is necessary to determine patient infection status.  The expected result is negative.  Fact Sheet for Patients:   RoadLapTop.co.za   Fact Sheet for Healthcare Providers:   http://kim-miller.com/    This test is not yet approved or cleared by the [place] FDA and  has been authorized for detection and/or diagnosis of SARS-CoV-2 by FDA under an Emergency Use Authorization (EUA).  This EUA will remain in effect (meaning this test can be used) for the duration of  the COVID-19 declaration under Section 564(b)(1)  of the Act, 21 U.S.C. section 360-bbb-3(b)(1), unless the authorization is terminated or revoked sooner.   Performed at [facility], [address] [place], [place] [address]   MRSA Next Gen by PCR, Nasal     Status: Abnormal   Collection Time: 04/13/22  1:15 AM   Specimen: Nasal Mucosa; Nasal Swab  Result Value Ref Range Status   MRSA by PCR Next Gen  DETECTED (A) NOT DETECTED Final    Comment: RESULT CALLED TO, READ BACK BY AND VERIFIED WITH: ANN CARLTON, RN @ 0955 ON 08.27.2023 BY LAUREN BOWMAN,MLS (NOTE) The GeneXpert MRSA Assay (FDA approved for NASAL specimens only), is one component of a comprehensive MRSA colonization surveillance program. It is not intended to diagnose MRSA infection nor to guide or monitor treatment for MRSA infections. Test performance is not FDA approved in patients less than 70 years old. Performed at [facility], [address] [place], [place] [address]          Radiology Studies: [facility] Chest [facility] 1 View  Result Date: 04/12/2022 CLINICAL DATA:  Altered mental status, fever EXAM: PORTABLE CHEST 1 VIEW COMPARISON:  03/01/2022 FINDINGS: Transverse diameter of heart is slightly increased. This may be partly due to poor inspiration. Linear densities are seen in left lower lung field. Left hemidiaphragm is elevated. There are no signs of alveolar pulmonary edema or focal consolidation. There is no pleural effusion or pneumothorax. Proximal right humerus is not seen suggesting previous removal. There is surgical fusion in cervical spine. IMPRESSION: Linear densities in left lower lung fields suggest scarring or subsegmental atelectasis. There are no signs of pulmonary edema or focal pulmonary consolidation. Electronically Signed   By: Ernie Avena M.D.   On: 04/12/2022 16:54   CT Head Wo Contrast  Result Date: 04/12/2022 CLINICAL DATA:  Mental status change EXAM: CT HEAD WITHOUT CONTRAST TECHNIQUE: Contiguous axial images were obtained from the base of the skull through the vertex without intravenous contrast. RADIATION DOSE REDUCTION: This exam was performed according to the departmental dose-optimization program which includes automated exposure control, adjustment of the mA and/or kV according to patient size and/or use of iterative reconstruction technique. COMPARISON:  CT scan of the brain March 02, 2022  FINDINGS: Brain: No evidence of acute infarction, hemorrhage, hydrocephalus, extra-axial collection or mass lesion/mass effect. Vascular: Calcified atherosclerotic change in the intracranial carotids. Skull: Normal. Negative for fracture or focal lesion. Sinuses/Orbits: Mucosal thickening in scattered ethmoid air cells and sphenoid sinus. Other: None. IMPRESSION: No acute intracranial abnormalities.  Sinus disease as above. Electronically Signed   By: Gerome Sam III M.D.   On: 04/12/2022 15:46        Scheduled Meds:  vitamin C  500 mg Oral Daily   divalproex  250 mg Oral BID   enoxaparin (LOVENOX) injection  40 mg Subcutaneous Q24H   gabapentin  300 mg Oral BID WC   And   gabapentin  600 mg Oral QHS   insulin aspart  0-15 Units Subcutaneous TID WC   insulin aspart  0-5 Units Subcutaneous QHS   lamoTRIgine  50 mg Oral Daily   linagliptin  5 mg Oral Daily   metoprolol succinate  25 mg Oral Daily   nirmatrelvir/ritonavir EUA (renal dosing)  2 tablet Oral BID   torsemide  40 mg Oral q AM   zinc sulfate  220 mg Oral Daily   Continuous Infusions:  cefTRIAXone (ROCEPHIN)  IV Stopped (04/12/22 1841)   famotidine (PEPCID) IV Stopped (04/12/22 2208)     LOS: 1 day    Time spent: 35 minutes    Crystal Petrasek Hoover Brunette, DO [facility]  If 7PM-7AM, please contact night-coverage [email or web address] 04/13/2022, 11:12 AM

## 2022-04-14 DIAGNOSIS — G9340 Encephalopathy, unspecified: Secondary | ICD-10-CM | POA: Diagnosis not present

## 2022-04-14 LAB — CBC WITH DIFFERENTIAL/PLATELET
Abs Immature Granulocytes: 0.03 10*3/uL (ref 0.00–0.07)
Basophils Absolute: 0.1 10*3/uL (ref 0.0–0.1)
Basophils Relative: 1 %
Eosinophils Absolute: 0.3 10*3/uL (ref 0.0–0.5)
Eosinophils Relative: 4 %
HCT: 30 % — ABNORMAL LOW (ref 36.0–46.0)
Hemoglobin: 9 g/dL — ABNORMAL LOW (ref 12.0–15.0)
Immature Granulocytes: 1 %
Lymphocytes Relative: 24 %
Lymphs Abs: 1.5 10*3/uL (ref 0.7–4.0)
MCH: 28.8 pg (ref 26.0–34.0)
MCHC: 30 g/dL (ref 30.0–36.0)
MCV: 95.8 fL (ref 80.0–100.0)
Monocytes Absolute: 0.8 10*3/uL (ref 0.1–1.0)
Monocytes Relative: 12 %
Neutro Abs: 3.7 10*3/uL (ref 1.7–7.7)
Neutrophils Relative %: 58 %
Platelets: 200 10*3/uL (ref 150–400)
RBC: 3.13 MIL/uL — ABNORMAL LOW (ref 3.87–5.11)
RDW: 16.5 % — ABNORMAL HIGH (ref 11.5–15.5)
WBC: 6.2 10*3/uL (ref 4.0–10.5)
nRBC: 0 % (ref 0.0–0.2)

## 2022-04-14 LAB — GLUCOSE, CAPILLARY
Glucose-Capillary: 83 mg/dL (ref 70–99)
Glucose-Capillary: 93 mg/dL (ref 70–99)
Glucose-Capillary: 120 mg/dL — ABNORMAL HIGH (ref 70–99)
Glucose-Capillary: 204 mg/dL — ABNORMAL HIGH (ref 70–99)

## 2022-04-14 LAB — COMPREHENSIVE METABOLIC PANEL
ALT: 9 U/L (ref 0–44)
AST: 19 U/L (ref 15–41)
Albumin: 2.7 g/dL — ABNORMAL LOW (ref 3.5–5.0)
Alkaline Phosphatase: 43 U/L (ref 38–126)
Anion gap: 9 (ref 5–15)
BUN: 23 mg/dL (ref 8–23)
CO2: 25 mmol/L (ref 22–32)
Calcium: 7.5 mg/dL — ABNORMAL LOW (ref 8.9–10.3)
Chloride: 103 mmol/L (ref 98–111)
Creatinine, Ser: 1.71 mg/dL — ABNORMAL HIGH (ref 0.44–1.00)
GFR, Estimated: 32 mL/min — ABNORMAL LOW (ref >60)
Glucose, Bld: 83 mg/dL (ref 70–99)
Potassium: 3 mmol/L — ABNORMAL LOW (ref 3.5–5.1)
Sodium: 137 mmol/L (ref 135–145)
Total Bilirubin: 0.6 mg/dL (ref 0.3–1.2)
Total Protein: 6.4 g/dL — ABNORMAL LOW (ref 6.5–8.1)

## 2022-04-14 LAB — MAGNESIUM: Magnesium: 1.8 mg/dL (ref 1.7–2.4)

## 2022-04-14 LAB — D-DIMER, QUANTITATIVE: D-Dimer, Quant: 2.61 ug/mL-FEU — ABNORMAL HIGH (ref 0.00–0.50)

## 2022-04-14 LAB — URINE CULTURE: Culture: NO GROWTH

## 2022-04-14 LAB — C-REACTIVE PROTEIN: CRP: 5.2 mg/dL — ABNORMAL HIGH (ref <1.0)

## 2022-04-14 MED ORDER — METHYLPREDNISOLONE SODIUM SUCC 40 MG IJ SOLR
40.0000 mg | Freq: Two times a day (BID) | INTRAMUSCULAR | Status: DC
  Administered 2022-04-14 – 2022-04-15 (×3): 40 mg via INTRAVENOUS
  Filled 2022-04-14 (×3): qty 1

## 2022-04-14 MED ORDER — ALPRAZOLAM 0.5 MG PO TABS
0.5000 mg | ORAL_TABLET | Freq: Three times a day (TID) | ORAL | Status: DC | PRN
Start: 2022-04-14 — End: 2022-04-15
  Administered 2022-04-14 – 2022-04-15 (×2): 0.5 mg via ORAL
  Filled 2022-04-14 (×2): qty 1

## 2022-04-14 MED ORDER — MUPIROCIN 2 % EX OINT
1.0000 | TOPICAL_OINTMENT | Freq: Two times a day (BID) | CUTANEOUS | Status: DC
  Administered 2022-04-14 – 2022-04-15 (×3): 1 via NASAL
  Filled 2022-04-14: qty 22

## 2022-04-14 MED ORDER — POTASSIUM CHLORIDE CRYS ER 20 MEQ PO TBCR
40.0000 meq | EXTENDED_RELEASE_TABLET | Freq: Two times a day (BID) | ORAL | Status: AC
Start: 2022-04-14 — End: 2022-04-14
  Administered 2022-04-14 (×2): 40 meq via ORAL
  Filled 2022-04-14 (×2): qty 2

## 2022-04-14 MED ORDER — LACTATED RINGERS IV SOLN: INTRAVENOUS | Status: AC

## 2022-04-14 NOTE — Progress Notes (Signed)
PROGRESS NOTE    Crystal Moses  FBP:[id number] DOB: [birth year] DOA: 04/12/2022 PCP: Charlynne Pander, MD   Brief Narrative:    Crystal Moses is a 66 y.o. female with medical history significant of diabetes, heart failure with preserved EF, hypertension.  Patient is a resident of [facility].  For the past day, the patient has been increased confused and with a fever.  She was admitted for acute metabolic encephalopathy in the setting of COVID-19 infection as well as concern for UTI she has been started on treatment with Paxlovid as well as Rocephin empirically.  Rocephin has now been discontinued as urine cultures demonstrated no growth.  She has been started on IV steroids 8/28 due to persistent hypoxemia with ongoing confusion.  Assessment & Plan:   Principal Problem:   Acute encephalopathy Active Problems:   CHF (congestive heart failure) (HCC)   DM (diabetes mellitus) (HCC)   HTN (hypertension)   Class 3 obesity (HCC)   COVID-19 virus infection  Assessment and Plan:   Acute metabolic encephalopathy possibly secondary to COVID-19 infection as well as UTI Cultures with no growth This may be secondary to COVID-19 Urine culture with no growth and Rocephin discontinued TSH and ammonia within normal limits Hold gabapentin and Xanax starting 8/28 COVID-19 infection Continue Paxlovid Start IV Solu-Medrol twice daily 8/28 No prior vaccines Wean O2 as tolerated Heart failure with preserved EF Hold torsemide and give time-limited IVF Diabetes Sliding scale insulin Start Tradjenta Hypertension Continue home antihypertensives Morbid obesity BMI 44.65 Hypokalemia Replete and reevaluate in a.m.   DVT prophylaxis: Lovenox Code Status: Full Family Communication: Discussed with son on phone 8/28 Disposition Plan:  Status is: Inpatient Remains inpatient appropriate because: Ongoing confusion with need for IV medications.     Consultants:  None   Procedures:  None    Antimicrobials:  Anti-infectives (From admission, onward)    Start     Dose/Rate Route Frequency Ordered Stop   04/12/22 2200  nirmatrelvir/ritonavir EUA (renal dosing) (PAXLOVID) 2 tablet        2 tablet Oral 2 times daily 04/12/22 1949 04/17/22 2159   04/12/22 1745  cefTRIAXone (ROCEPHIN) 1 g in sodium chloride 0.9 % 100 mL IVPB  Status:  Discontinued        1 g 200 mL/hr over 30 Minutes Intravenous Every 24 hours 04/12/22 1730 04/14/22 0902       Subjective: Patient seen and evaluated today with ongoing somnolence and confusion noted.  No acute overnight events noted.  Objective: Vitals:   04/13/22 2216 04/13/22 2246 04/14/22 0500 04/14/22 1028  BP: 136/70 107/86 96/63 (!) 105/57  Pulse: 78  80 80  Resp: 17 19 19    Temp: 98.5 F (36.9 C) 98.6 F (37 C) 98.8 F (37.1 C)   TempSrc: Oral Oral    SpO2: 95%  95%   Weight:      Height:        Intake/Output Summary (Last 24 hours) at 04/14/2022 1040 Last data filed at 04/14/2022 0737 Gross per 24 hour  Intake 100.36 ml  Output 3 ml  Net 97.36 ml   Filed Weights   04/12/22 2030 04/12/22 2347 04/13/22 0756  Weight: 113 kg 107.2 kg 113 kg    Examination:  General exam: Appears somnolent/lethargic, obese Respiratory system: Clear to auscultation. Respiratory effort normal.  2 L nasal cannula Cardiovascular system: S1 & S2 heard, RRR.  Gastrointestinal system: Abdomen is soft Central nervous system: Somnolent/lethargic Extremities: No edema Skin: No significant  lesions noted Psychiatry: Flat affect.    Data Reviewed: I have personally reviewed following labs and imaging studies  CBC: Recent Labs  Lab 04/12/22 1500 04/13/22 0536 04/14/22 0602  WBC 9.1 7.8 6.2  NEUTROABS 7.3 5.6 3.7  HGB 10.4* 9.7* 9.0*  HCT 34.2* 32.0* 30.0*  MCV 94.2 94.4 95.8  PLT 263 217 [id number]   Basic Metabolic Panel: Recent Labs  Lab 04/12/22 1500 04/13/22 0536 04/14/22 0602  NA 131* 136 137  K 4.1 3.5 3.0*  CL 95* 103 103  CO2  26 25 25   GLUCOSE 125* 103* 83  BUN 23 22 23   CREATININE 1.57* 1.40* 1.71*  CALCIUM 8.6* 8.4* 7.5*  MG  --   --  1.8   GFR: Estimated Creatinine Clearance: 37.2 mL/min (A) (by C-G formula based on SCr of 1.71 mg/dL (H)). Liver Function Tests: Recent Labs  Lab 04/12/22 1500 04/13/22 0536 04/14/22 0602  AST 19 18 19   ALT 11 9 9   ALKPHOS 58 53 43  BILITOT 0.6 0.6 0.6  PROT 8.0 6.9 6.4*  ALBUMIN 3.4* 2.9* 2.7*   No results for input(s): "LIPASE", "AMYLASE" in the last 168 hours. Recent Labs  Lab 04/13/22 1138  AMMONIA 16   Coagulation Profile: Recent Labs  Lab 04/12/22 1500  INR 1.0   Cardiac Enzymes: No results for input(s): "CKTOTAL", "CKMB", "CKMBINDEX", "TROPONINI" in the last 168 hours. BNP (last 3 results) No results for input(s): "PROBNP" in the last 8760 hours. HbA1C: Recent Labs    04/12/22 1511  HGBA1C 5.5   CBG: Recent Labs  Lab 04/13/22 1151 04/13/22 1605 04/13/22 2231 04/13/22 2335 04/14/22 0747  GLUCAP 100* 103* 59* 143* 83   Lipid Profile: No results for input(s): "CHOL", "HDL", "LDLCALC", "TRIG", "CHOLHDL", "LDLDIRECT" in the last 72 hours. Thyroid Function Tests: Recent Labs    04/13/22 1138  TSH 0.748   Anemia Panel: Recent Labs    04/12/22 1600 04/13/22 0536  FERRITIN 23 35   Sepsis Labs: Recent Labs  Lab 04/12/22 1500  PROCALCITON <0.10  LATICACIDVEN 1.1    Recent Results (from the past 240 hour(s))  Blood Culture (routine x 2)     Status: None (Preliminary result)   Collection Time: 04/12/22  3:00 PM   Specimen: BLOOD LEFT FOREARM  Result Value Ref Range Status   Specimen Description   Final    BLOOD LEFT FOREARM BOTTLES DRAWN AEROBIC AND ANAEROBIC   Special Requests   Final    Blood Culture results may not be optimal due to an excessive volume of blood received in culture bottles   Culture   Final    NO GROWTH 1 DAY Performed at [facility], [address] [place], [place] [address]    Report Status PENDING   Incomplete  Blood Culture (routine x 2)     Status: None (Preliminary result)   Collection Time: 04/12/22  3:00 PM   Specimen: Right Antecubital; Blood  Result Value Ref Range Status   Specimen Description   Final    RIGHT ANTECUBITAL BOTTLES DRAWN AEROBIC AND ANAEROBIC   Special Requests   Final    Blood Culture results may not be optimal due to an excessive volume of blood received in culture bottles   Culture   Final    NO GROWTH 1 DAY Performed at [facility], [address] [place], [place] [address]    Report Status PENDING  Incomplete  SARS Coronavirus 2 by RT PCR (hospital order, performed in [facility] hospital lab) *cepheid single result test* Anterior Nasal Swab     Status: Abnormal   Collection Time: 04/12/22  3:10 PM   Specimen: Anterior Nasal Swab  Result Value Ref Range Status   SARS Coronavirus 2 by RT PCR POSITIVE (A) NEGATIVE Final    Comment: (NOTE) SARS-CoV-2 target nucleic acids are DETECTED  SARS-CoV-2 RNA is generally detectable in upper respiratory specimens  during the acute phase of infection.  Positive results are indicative  of the presence of the identified virus, but do not rule out bacterial infection or co-infection with other pathogens not detected by the test.  Clinical correlation with patient history and  other diagnostic information is necessary to determine patient infection status.  The expected result is negative.  Fact Sheet for Patients:   RoadLapTop.co.za   Fact Sheet for Healthcare Providers:   http://kim-miller.com/    This test is not yet approved or cleared by the [place] FDA and  has been authorized for detection and/or diagnosis of SARS-CoV-2 by FDA under an Emergency Use Authorization (EUA).  This EUA will remain in effect (meaning this test can be used) for the duration of  the COVID-19 declaration under Section 564(b)(1)  of the Act, 21 U.S.C. section 360-bbb-3(b)(1),  unless the authorization is terminated or revoked sooner.   Performed at [facility], [address], [place], [place] [address]   Urine Culture     Status: None   Collection Time: 04/12/22  4:00 PM   Specimen: In/Out Cath Urine  Result Value Ref Range Status   Specimen Description   Final    IN/OUT CATH URINE Performed at [facility], [address] [place], [place] [address]    Special Requests   Final    NONE Performed at [facility], [address], [place], [place] [address]    Culture   Final    NO GROWTH Performed at [facility], [address], [place], [place] [address]    Report Status 04/14/2022 FINAL  Final  MRSA Next Gen by PCR, Nasal     Status: Abnormal   Collection Time: 04/13/22  1:15 AM   Specimen: Nasal Mucosa; Nasal Swab  Result Value Ref Range Status   MRSA by PCR Next Gen DETECTED (A) NOT DETECTED Final    Comment: RESULT CALLED TO, READ BACK BY AND VERIFIED WITH: ANN CARLTON, RN @ 0955 ON 08.27.2023 BY LAUREN BOWMAN,MLS (NOTE) The GeneXpert MRSA Assay (FDA approved for NASAL specimens only), is one component of a comprehensive MRSA colonization surveillance program. It is not intended to diagnose MRSA infection nor to guide or monitor treatment for MRSA infections. Test performance is not FDA approved in patients less than 28 years old. Performed at [facility], [address] [place], [place] [address]          Radiology Studies: [facility] 1 View  Result Date: 04/12/2022 CLINICAL DATA:  Altered mental status, fever EXAM: PORTABLE CHEST 1 VIEW COMPARISON:  03/01/2022 FINDINGS: Transverse diameter of heart is slightly increased. This may be partly due to poor inspiration. Linear densities are seen in left lower lung field. Left hemidiaphragm is elevated. There are no signs of alveolar pulmonary edema or focal consolidation. There is no pleural effusion or pneumothorax. Proximal right humerus is not seen suggesting previous removal.  There is surgical fusion in cervical spine. IMPRESSION: Linear densities in left lower lung fields suggest scarring or subsegmental atelectasis. There are no signs of pulmonary edema or focal  pulmonary consolidation. Electronically Signed   By: Elmer Picker M.D.   On: 04/12/2022 16:54   CT Head Wo Contrast  Result Date: 04/12/2022 CLINICAL DATA:  Mental status change EXAM: CT HEAD WITHOUT CONTRAST TECHNIQUE: Contiguous axial images were obtained from the base of the skull through the vertex without intravenous contrast. RADIATION DOSE REDUCTION: This exam was performed according to the departmental dose-optimization program which includes automated exposure control, adjustment of the mA and/or kV according to patient size and/or use of iterative reconstruction technique. COMPARISON:  CT scan of the brain March 02, 2022 FINDINGS: Brain: No evidence of acute infarction, hemorrhage, hydrocephalus, extra-axial collection or mass lesion/mass effect. Vascular: Calcified atherosclerotic change in the intracranial carotids. Skull: Normal. Negative for fracture or focal lesion. Sinuses/Orbits: Mucosal thickening in scattered ethmoid air cells and sphenoid sinus. Other: None. IMPRESSION: No acute intracranial abnormalities.  Sinus disease as above. Electronically Signed   By: Dorise Bullion III M.D.   On: 04/12/2022 15:46        Scheduled Meds:  vitamin C  500 mg Oral Daily   Chlorhexidine Gluconate Cloth  6 each Topical Daily   divalproex  250 mg Oral BID   enoxaparin (LOVENOX) injection  40 mg Subcutaneous Q24H   insulin aspart  0-15 Units Subcutaneous TID WC   insulin aspart  0-5 Units Subcutaneous QHS   lamoTRIgine  50 mg Oral Daily   linagliptin  5 mg Oral Daily   methylPREDNISolone (SOLU-MEDROL) injection  40 mg Intravenous Q12H   metoprolol succinate  25 mg Oral Daily   mupirocin ointment  1 Application Nasal BID   nirmatrelvir/ritonavir EUA (renal dosing)  2 tablet Oral BID   potassium  chloride  40 mEq Oral BID   zinc sulfate  220 mg Oral Daily   Continuous Infusions:  famotidine (PEPCID) IV 20 mg (04/13/22 2153)   lactated ringers       LOS: 2 days    Time spent: 35 minutes    Debbora Ang Darleen Crocker, DO [facility]  If 7PM-7AM, please contact night-coverage [email or web address] 04/14/2022, 10:40 AM

## 2022-04-14 NOTE — TOC Initial Note (Signed)
Transition of Care [facility]) - Initial/Assessment Note    Patient Details  Name: Crystal Moses MRN: [id number] Date of Birth: 1955/09/07  Transition of Care [facility]) CM/SW Contact:    Elliot Gault, LCSW Phone Number: 04/14/2022, 2:35 PM  Clinical Narrative:                  Pt admitted from [facility] rehab. Spoke with Jill Side at the SNF to update. Pt can return at dc.  Pt will need new SNF auth. Asked MD for PT eval. TOC will start auth once PT recommendations made. Will follow.  Expected Discharge Plan: Skilled Nursing Facility Barriers to Discharge: Continued Medical Work up   Patient Goals and CMS Choice Patient states their goals for this hospitalization and ongoing recovery are:: get better      Expected Discharge Plan and Services Expected Discharge Plan: Skilled Nursing Facility In-house Referral: Clinical Social Work     Living arrangements for the past 2 months: Skilled Nursing Facility                                      Prior Living Arrangements/Services Living arrangements for the past 2 months: Skilled Nursing Facility Lives with:: Facility Resident Patient language and need for interpreter reviewed:: Yes Do you feel safe going back to the place where you live?: Yes      Need for Family Participation in Patient Care: No (Comment) Care giver support system in place?: Yes (comment)   Criminal Activity/Legal Involvement Pertinent to Current Situation/Hospitalization: No - Comment as needed  Activities of Daily Living Home Assistive Devices/Equipment: None ADL Screening (condition at time of admission) Patient's cognitive ability adequate to safely complete daily activities?: Yes Is the patient deaf or have difficulty hearing?: No Does the patient have difficulty seeing, even when wearing glasses/contacts?: No Does the patient have difficulty concentrating, remembering, or making decisions?: No Patient able to express need for assistance with ADLs?:  Yes Does the patient have difficulty dressing or bathing?: Yes Independently performs ADLs?: No Communication: Independent Dressing (OT): Needs assistance Is this a change from baseline?: Pre-admission baseline Grooming: Needs assistance Is this a change from baseline?: Pre-admission baseline Feeding: Independent Bathing: Dependent Is this a change from baseline?: Pre-admission baseline Toileting: Dependent Is this a change from baseline?: Pre-admission baseline In/Out Bed: Dependent Is this a change from baseline?: Pre-admission baseline Walks in Home: Dependent Does the patient have difficulty walking or climbing stairs?: Yes Weakness of Legs: Both Weakness of Arms/Hands: None  Permission Sought/Granted                  Emotional Assessment       Orientation: : Oriented to Self, Oriented to Place, Oriented to Situation, Oriented to  Time Alcohol / Substance Use: Not Applicable Psych Involvement: No (comment)  Admission diagnosis:  Acute encephalopathy [G93.40] Urinary tract infection without hematuria, site unspecified [N39.0] COVID-19 [U07.1] Patient Active Problem List   Diagnosis Date Noted   Acute encephalopathy 04/12/2022   COVID-19 virus infection 04/12/2022   Osteomyelitis of right shoulder (HCC) 03/10/2022   E coli infection 03/10/2022   Status post peripherally inserted central catheter (PICC) central line placement 03/10/2022   Class 3 obesity (HCC) 02/17/2022   AKI (acute kidney injury) (HCC) 02/10/2022   Abscess of right shoulder 02/10/2022   DM (diabetes mellitus) (HCC) 02/10/2022   CHF (congestive heart failure) (HCC) 02/10/2022   HTN (hypertension)  02/10/2022   Acute metabolic encephalopathy 02/10/2022   Greater trochanter fracture (HCC) 11/03/2020   Closed left hip fracture, initial encounter (HCC) 11/02/2020   PCP:  Charlynne Pander, MD Pharmacy:   [facility] [address] [place] - [address] [place]  [address] Phone: [phone] Fax: [phone]     Social Determinants of Health (SDOH) Interventions    Readmission Risk Interventions     No data to display

## 2022-04-14 NOTE — [place] FL2 (Signed)
[place] MEDICAID FL2 LEVEL OF CARE SCREENING TOOL     IDENTIFICATION  Patient Name: Crystal Moses Birthdate: 04-Nov-1955 Sex: female Admission Date (Current Location): 04/12/2022  [facility] and [place] Number:  [employer] and Address:  [facility],  [address] [place] [address]      Provider Number: [id number]  Attending Physician Name and Address:  Erick Blinks, DO  Relative Name and Phone Number:       Current Level of Care: Hospital Recommended Level of Care: Skilled Nursing Facility Prior Approval Number:    Date Approved/Denied:   PASRR Number:    Discharge Plan: SNF    Current Diagnoses: Patient Active Problem List   Diagnosis Date Noted   Acute encephalopathy 04/12/2022   COVID-19 virus infection 04/12/2022   Osteomyelitis of right shoulder (HCC) 03/10/2022   E coli infection 03/10/2022   Status post peripherally inserted central catheter (PICC) central line placement 03/10/2022   Class 3 obesity (HCC) 02/17/2022   AKI (acute kidney injury) (HCC) 02/10/2022   Abscess of right shoulder 02/10/2022   DM (diabetes mellitus) (HCC) 02/10/2022   CHF (congestive heart failure) (HCC) 02/10/2022   HTN (hypertension) 02/10/2022   Acute metabolic encephalopathy 02/10/2022   Greater trochanter fracture (HCC) 11/03/2020   Closed left hip fracture, initial encounter (HCC) 11/02/2020    Orientation RESPIRATION BLADDER Height & Weight     Self, Time, Situation, Place  O2 (see dc summary) Incontinent Weight: 249 lb 1.9 oz (113 kg) Height:  5\' 1"  (154.9 cm)  BEHAVIORAL SYMPTOMS/MOOD NEUROLOGICAL BOWEL NUTRITION STATUS      Continent Diet (see dc summary)  AMBULATORY STATUS COMMUNICATION OF NEEDS Skin   Extensive Assist Verbally Normal                       Personal Care Assistance Level of Assistance  Bathing, Feeding, Dressing Bathing Assistance: Limited assistance Feeding assistance: Independent Dressing Assistance: Limited  assistance     Functional Limitations Info  Sight, Hearing, Speech Sight Info: Impaired Hearing Info: Adequate Speech Info: Adequate    SPECIAL CARE FACTORS FREQUENCY  PT (By licensed PT), OT (By licensed OT)     PT Frequency: 5x week OT Frequency: 3x week            Contractures Contractures Info: Not present    Additional Factors Info  Code Status, Allergies Code Status Info: Full Allergies Info: KNA           Current Medications (04/14/2022):  This is the current hospital active medication list Current Facility-Administered Medications  Medication Dose Route Frequency Provider Last Rate Last Admin   acetaminophen (TYLENOL) tablet 650 mg  650 mg Oral Q6H PRN 04/16/2022, DO   650 mg at 04/13/22 1520   ascorbic acid (VITAMIN C) tablet 500 mg  500 mg Oral Daily 04/15/22, DO   500 mg at 04/14/22 1012   Chlorhexidine Gluconate Cloth 2 % PADS 6 each  6 each Topical Daily 04/16/22 D, DO   6 each at 04/14/22 1022   divalproex (DEPAKOTE SPRINKLE) capsule 250 mg  250 mg Oral BID 04/16/22, DO   250 mg at 04/14/22 1012   enoxaparin (LOVENOX) injection 40 mg  40 mg Subcutaneous Q24H 04/16/22, DO   40 mg at 04/13/22 1945   famotidine (PEPCID) IVPB 20 mg premix  20 mg Intravenous Q24H 04/15/22, DO 100 mL/hr at 04/13/22 2153  20 mg at 04/13/22 2153   ibuprofen (ADVIL) tablet 600 mg  600 mg Oral Q6H PRN Levie Heritage, DO       insulin aspart (novoLOG) injection 0-15 Units  0-15 Units Subcutaneous TID WC Levie Heritage, DO       insulin aspart (novoLOG) injection 0-5 Units  0-5 Units Subcutaneous QHS Stinson, Jacob J, DO       lactated ringers infusion   Intravenous Continuous Maurilio Lovely D, DO 75 mL/hr at 04/14/22 1242 New Bag at 04/14/22 1242   lamoTRIgine (LAMICTAL) tablet 50 mg  50 mg Oral Daily Levie Heritage, DO   50 mg at 04/14/22 1012   linagliptin (TRADJENTA) tablet 5 mg  5 mg Oral Daily Levie Heritage, DO   5 mg at 04/13/22 1011    methylPREDNISolone sodium succinate (SOLU-MEDROL) 40 mg/mL injection 40 mg  40 mg Intravenous Q12H Shah, Pratik D, DO   40 mg at 04/14/22 1238   metoprolol succinate (TOPROL-XL) 24 hr tablet 25 mg  25 mg Oral Daily Levie Heritage, DO   25 mg at 04/13/22 0848   mupirocin ointment (BACTROBAN) 2 % 1 Application  1 Application Nasal BID Maurilio Lovely D, DO   1 Application at 04/14/22 1238   nirmatrelvir/ritonavir EUA (renal dosing) (PAXLOVID) 2 tablet  2 tablet Oral BID Levie Heritage, DO   2 tablet at 04/14/22 1021   ondansetron (ZOFRAN) tablet 4 mg  4 mg Oral Q6H PRN Levie Heritage, DO       Or   ondansetron [facility]) injection 4 mg  4 mg Intravenous Q6H PRN Levie Heritage, DO       potassium chloride SA (KLOR-CON M) CR tablet 40 mEq  40 mEq Oral BID Maurilio Lovely D, DO   40 mEq at 04/14/22 1238   zinc sulfate capsule 220 mg  220 mg Oral Daily Levie Heritage, DO   220 mg at 04/14/22 1012     Discharge Medications: Please see discharge summary for a list of discharge medications.  Relevant Imaging Results:  Relevant Lab Results:   Additional Information    Elliot Gault, LCSW

## 2022-04-15 DIAGNOSIS — G9340 Encephalopathy, unspecified: Secondary | ICD-10-CM | POA: Diagnosis not present

## 2022-04-15 LAB — CBC WITH DIFFERENTIAL/PLATELET
Abs Immature Granulocytes: 0.01 10*3/uL (ref 0.00–0.07)
Basophils Absolute: 0 10*3/uL (ref 0.0–0.1)
Basophils Relative: 0 %
Eosinophils Absolute: 0 10*3/uL (ref 0.0–0.5)
Eosinophils Relative: 0 %
HCT: 33.5 % — ABNORMAL LOW (ref 36.0–46.0)
Hemoglobin: 10.2 g/dL — ABNORMAL LOW (ref 12.0–15.0)
Immature Granulocytes: 0 %
Lymphocytes Relative: 28 %
Lymphs Abs: 0.7 10*3/uL (ref 0.7–4.0)
MCH: 28.6 pg (ref 26.0–34.0)
MCHC: 30.4 g/dL (ref 30.0–36.0)
MCV: 93.8 fL (ref 80.0–100.0)
Monocytes Absolute: 0.1 10*3/uL (ref 0.1–1.0)
Monocytes Relative: 2 %
Neutro Abs: 1.9 10*3/uL (ref 1.7–7.7)
Neutrophils Relative %: 70 %
Platelets: 200 10*3/uL (ref 150–400)
RBC: 3.57 MIL/uL — ABNORMAL LOW (ref 3.87–5.11)
RDW: 15.7 % — ABNORMAL HIGH (ref 11.5–15.5)
WBC: 2.7 10*3/uL — ABNORMAL LOW (ref 4.0–10.5)
nRBC: 0 % (ref 0.0–0.2)

## 2022-04-15 LAB — COMPREHENSIVE METABOLIC PANEL
ALT: 11 U/L (ref 0–44)
AST: 22 U/L (ref 15–41)
Albumin: 3.2 g/dL — ABNORMAL LOW (ref 3.5–5.0)
Alkaline Phosphatase: 51 U/L (ref 38–126)
Anion gap: 11 (ref 5–15)
BUN: 25 mg/dL — ABNORMAL HIGH (ref 8–23)
CO2: 25 mmol/L (ref 22–32)
Calcium: 8.1 mg/dL — ABNORMAL LOW (ref 8.9–10.3)
Chloride: 104 mmol/L (ref 98–111)
Creatinine, Ser: 1.55 mg/dL — ABNORMAL HIGH (ref 0.44–1.00)
GFR, Estimated: 36 mL/min — ABNORMAL LOW (ref >60)
Glucose, Bld: 156 mg/dL — ABNORMAL HIGH (ref 70–99)
Potassium: 4.1 mmol/L (ref 3.5–5.1)
Sodium: 140 mmol/L (ref 135–145)
Total Bilirubin: 0.5 mg/dL (ref 0.3–1.2)
Total Protein: 7.8 g/dL (ref 6.5–8.1)

## 2022-04-15 LAB — MAGNESIUM: Magnesium: 2 mg/dL (ref 1.7–2.4)

## 2022-04-15 LAB — GLUCOSE, CAPILLARY
Glucose-Capillary: 206 mg/dL — ABNORMAL HIGH (ref 70–99)
Glucose-Capillary: 178 mg/dL — ABNORMAL HIGH (ref 70–99)

## 2022-04-15 LAB — D-DIMER, QUANTITATIVE: D-Dimer, Quant: 2.48 ug/mL-FEU — ABNORMAL HIGH (ref 0.00–0.50)

## 2022-04-15 LAB — C-REACTIVE PROTEIN: CRP: 3.8 mg/dL — ABNORMAL HIGH (ref <1.0)

## 2022-04-15 MED ORDER — PREDNISONE 10 MG PO TABS: 40.0000 mg | ORAL_TABLET | Freq: Every day | ORAL | 0 refills | Status: AC

## 2022-04-15 MED ORDER — ASCORBIC ACID 500 MG PO TABS: 500.0000 mg | ORAL_TABLET | Freq: Every day | ORAL | 0 refills | Status: AC

## 2022-04-15 MED ORDER — ZINC SULFATE 220 (50 ZN) MG PO CAPS: 220.0000 mg | ORAL_CAPSULE | Freq: Every day | ORAL | 0 refills | Status: AC

## 2022-04-15 MED ORDER — TRAMADOL HCL 50 MG PO TABS: 50.0000 mg | ORAL_TABLET | Freq: Four times a day (QID) | ORAL | 0 refills | Status: AC | PRN

## 2022-04-15 MED ORDER — NIRMATRELVIR/RITONAVIR (PAXLOVID) TABLET (RENAL DOSING): 2.0000 | ORAL_TABLET | Freq: Two times a day (BID) | ORAL | 0 refills | Status: AC

## 2022-04-15 MED ORDER — ALPRAZOLAM 0.5 MG PO TABS: 0.5000 mg | ORAL_TABLET | Freq: Two times a day (BID) | ORAL | 0 refills | Status: AC

## 2022-04-15 NOTE — Discharge Summary (Addendum)
Physician Discharge Summary  Crystal Moses ZOX:[id number] DOB: 27-Jul-1956 DOA: 04/12/2022  PCP: Charlynne PanderBlass, Joel, MD  Admit date: 04/12/2022  Discharge date: 04/15/2022  Admitted From:SNF  Disposition:  SNF  Recommendations for Outpatient Follow-up:  Follow up with PCP in 1-2 weeks Continue on Paxlovid to finish course of treatment as prescribed Continue on prednisone for several more days to complete course of treatment Resume home medications as otherwise noted below  Home Health: None  Equipment/Devices: 1-2 L nasal cannula oxygen  Discharge Condition:Stable  CODE STATUS: Full  Diet recommendation: Heart Healthy/carb modified  Brief/Interim Summary: Crystal MinersReba T Hausen is a 66 y.o. female with medical history significant of diabetes, heart failure with preserved EF, hypertension.  Patient is a resident of [facility].  For the past day, the patient has been increased confused and with a fever.  She was admitted for acute metabolic encephalopathy in the setting of COVID-19 infection as well as concern for UTI she has been started on treatment with Paxlovid as well as Rocephin empirically.  Rocephin has now been discontinued as urine cultures demonstrated no growth.  She has been started on IV steroids 8/28 due to persistent hypoxemia with ongoing confusion.  Her confusion has resolved in the last 24 hours and she has been seen by PT with recommendations for SNF on discharge.  She will be discharged back to her facility today with ongoing Paxlovid and prednisone as noted above.  No other acute events or concerns noted.  Discharge Diagnoses:  Principal Problem:   Acute encephalopathy Active Problems:   CHF (congestive heart failure) (HCC)   DM (diabetes mellitus) (HCC)   HTN (hypertension)   Class 3 obesity (HCC)   COVID-19 virus infection  Principal discharge diagnosis: Acute metabolic encephalopathy secondary to acute hypoxemic respiratory failure from COVID-19 pneumonia.  Discharge  Instructions  Discharge Instructions     Diet - low sodium heart healthy   Complete by: As directed    Increase activity slowly   Complete by: As directed       Allergies as of 04/15/2022   No Known Allergies      Medication List     STOP taking these medications    cefTRIAXone 10 g injection Commonly known as: ROCEPHIN       TAKE these medications    acetaminophen 500 MG tablet Commonly known as: TYLENOL Take 1,000 mg by mouth every 12 (twelve) hours as needed for fever or moderate pain. >100.1. Not to exceed 3 grams in 24 hours.   ALPRAZolam 0.5 MG tablet Commonly known as: XANAX Take 1 tablet (0.5 mg total) by mouth 2 (two) times daily.   ascorbic acid 500 MG tablet Commonly known as: VITAMIN C Take 1 tablet (500 mg total) by mouth daily. Start taking on: April 16, 2022   atorvastatin 80 MG tablet Commonly known as: LIPITOR Take 80 mg by mouth at bedtime.   CALCIUM 500 PO Take 500 mg by mouth in the morning and at bedtime.   diclofenac Sodium 1 % Gel Commonly known as: VOLTAREN Apply 1 Application topically 4 (four) times daily.   divalproex 125 MG capsule Commonly known as: DEPAKOTE SPRINKLE Take 250 mg by mouth 2 (two) times daily.   escitalopram 20 MG tablet Commonly known as: LEXAPRO Take 20 mg by mouth daily.   folic acid 1 MG tablet Commonly known as: FOLVITE Take 1 mg by mouth daily.   gabapentin 300 MG capsule Commonly known as: NEURONTIN Take 300 mg by mouth  3 (three) times daily. 0900, 1300, 2100   ipratropium-albuterol 0.5-2.5 (3) MG/3ML Soln Commonly known as: DUONEB Inhale 3 mLs into the lungs every 6 (six) hours as needed (sob/wheezing).   lamoTRIgine 25 MG tablet Commonly known as: LAMICTAL Take 25 mg by mouth 2 (two) times daily.   lidocaine 5 % Commonly known as: LIDODERM Place 1 patch onto the skin daily. Remove & Discard patch within 12 hours or as directed by MD   loperamide 2 MG tablet Commonly known as: IMODIUM  A-D Take 2 mg by mouth daily as needed for diarrhea or loose stools.   magnesium oxide 400 MG tablet Commonly known as: MAG-OX Take 400 mg by mouth daily.   melatonin 3 MG Tabs tablet Take 6 mg by mouth at bedtime.   meloxicam 15 MG tablet Commonly known as: MOBIC Take 1 tablet (15 mg total) by mouth 2 (two) times daily as needed for pain.   metoprolol succinate 25 MG 24 hr tablet Commonly known as: TOPROL-XL Take 1 tablet by mouth daily.   mirtazapine 7.5 MG tablet Commonly known as: REMERON Take 7.5 mg by mouth at bedtime.   nirmatrelvir/ritonavir EUA (renal dosing) 10 x 150 MG & 10 x 100MG  Tabs Commonly known as: PAXLOVID Take 2 tablets by mouth 2 (two) times daily for 3 days. Patient GFR is 36. Take nirmatrelvir (150 mg) one tablet twice daily for 5 days and ritonavir (100 mg) one tablet twice daily for 5 days.   polyethylene glycol 17 g packet Commonly known as: MIRALAX / GLYCOLAX Take 17 g by mouth daily as needed for moderate constipation.   Potassium Chloride ER 20 MEQ Tbcr Take 1 tablet by mouth daily.   predniSONE 10 MG tablet Commonly known as: DELTASONE Take 4 tablets (40 mg total) by mouth daily for 5 days.   PROBIOTIC PO Take 1 tablet by mouth in the morning and at bedtime.   sodium chloride 0.65 % nasal spray Commonly known as: OCEAN Place 1 spray into the nose every 2 (two) hours as needed for congestion.   torsemide 20 MG tablet Commonly known as: DEMADEX Take 40 mg by mouth in the morning.   traMADol 50 MG tablet Commonly known as: ULTRAM Take 1 tablet (50 mg total) by mouth every 6 (six) hours as needed for moderate pain.   Tums 500 MG chewable tablet Generic drug: calcium carbonate Chew 1 tablet by mouth every 8 (eight) hours as needed for heartburn.   zinc sulfate 220 (50 Zn) MG capsule Take 1 capsule (220 mg total) by mouth daily. Start taking on: April 16, 2022        Follow-up Information     April 18, 2022, MD. Schedule an  appointment as soon as possible for a visit.   Specialty: Internal Medicine Contact information: [address] [place] [phone]                No Known Allergies  Consultations: None   Procedures/Studies: DG Chest Port 1 View  Result Date: 04/12/2022 CLINICAL DATA:  Altered mental status, fever EXAM: PORTABLE CHEST 1 VIEW COMPARISON:  03/01/2022 FINDINGS: Transverse diameter of heart is slightly increased. This may be partly due to poor inspiration. Linear densities are seen in left lower lung field. Left hemidiaphragm is elevated. There are no signs of alveolar pulmonary edema or focal consolidation. There is no pleural effusion or pneumothorax. Proximal right humerus is not seen suggesting previous removal. There is surgical fusion in cervical spine. IMPRESSION:  Linear densities in left lower lung fields suggest scarring or subsegmental atelectasis. There are no signs of pulmonary edema or focal pulmonary consolidation. Electronically Signed   By: Ernie Avena M.D.   On: 04/12/2022 16:54   CT Head Wo Contrast  Result Date: 04/12/2022 CLINICAL DATA:  Mental status change EXAM: CT HEAD WITHOUT CONTRAST TECHNIQUE: Contiguous axial images were obtained from the base of the skull through the vertex without intravenous contrast. RADIATION DOSE REDUCTION: This exam was performed according to the departmental dose-optimization program which includes automated exposure control, adjustment of the mA and/or kV according to patient size and/or use of iterative reconstruction technique. COMPARISON:  CT scan of the brain March 02, 2022 FINDINGS: Brain: No evidence of acute infarction, hemorrhage, hydrocephalus, extra-axial collection or mass lesion/mass effect. Vascular: Calcified atherosclerotic change in the intracranial carotids. Skull: Normal. Negative for fracture or focal lesion. Sinuses/Orbits: Mucosal thickening in scattered ethmoid air cells and sphenoid sinus. Other:  None. IMPRESSION: No acute intracranial abnormalities.  Sinus disease as above. Electronically Signed   By: Gerome Sam III M.D.   On: 04/12/2022 15:46     Discharge Exam: Vitals:   04/15/22 0417 04/15/22 0959  BP: (!) 170/98 127/68  Pulse: 86 91  Resp: 18   Temp: 97.6 F (36.4 C)   SpO2: (!) 83%    Vitals:   04/14/22 1028 04/14/22 1933 04/15/22 0417 04/15/22 0959  BP: (!) 105/57 105/72 (!) 170/98 127/68  Pulse: 80 89 86 91  Resp:  18 18   Temp:  98.4 F (36.9 C) 97.6 F (36.4 C)   TempSrc:  Oral Oral   SpO2:  98% (!) 83%   Weight:      Height:        General: Pt is alert, awake, not in acute distress, obese Cardiovascular: RRR, S1/S2 +, no rubs, no gallops Respiratory: CTA bilaterally, no wheezing, no rhonchi, nasal cannula oxygen Abdominal: Soft, NT, ND, bowel sounds + Extremities: no edema, no cyanosis    The results of significant diagnostics from this hospitalization (including imaging, microbiology, ancillary and laboratory) are listed below for reference.     Microbiology: Recent Results (from the past 240 hour(s))  Blood Culture (routine x 2)     Status: None (Preliminary result)   Collection Time: 04/12/22  3:00 PM   Specimen: BLOOD LEFT FOREARM  Result Value Ref Range Status   Specimen Description   Final    BLOOD LEFT FOREARM BOTTLES DRAWN AEROBIC AND ANAEROBIC   Special Requests   Final    Blood Culture results may not be optimal due to an excessive volume of blood received in culture bottles   Culture   Final    NO GROWTH 3 DAYS Performed at [facility], [address], [place], [place] [address]    Report Status PENDING  Incomplete  Blood Culture (routine x 2)     Status: None (Preliminary result)   Collection Time: 04/12/22  3:00 PM   Specimen: Right Antecubital; Blood  Result Value Ref Range Status   Specimen Description   Final    RIGHT ANTECUBITAL BOTTLES DRAWN AEROBIC AND ANAEROBIC   Special Requests   Final    Blood Culture results  may not be optimal due to an excessive volume of blood received in culture bottles   Culture   Final    NO GROWTH 3 DAYS Performed at [facility], [address] [place], [place] [address]    Report Status PENDING  Incomplete  SARS Coronavirus 2 by RT PCR (hospital order, performed in [facility] hospital lab) *cepheid single result test* Anterior Nasal Swab     Status: Abnormal   Collection Time: 04/12/22  3:10 PM   Specimen: Anterior Nasal Swab  Result Value Ref Range Status   SARS Coronavirus 2 by RT PCR POSITIVE (A) NEGATIVE Final    Comment: (NOTE) SARS-CoV-2 target nucleic acids are DETECTED  SARS-CoV-2 RNA is generally detectable in upper respiratory specimens  during the acute phase of infection.  Positive results are indicative  of the presence of the identified virus, but do not rule out bacterial infection or co-infection with other pathogens not detected by the test.  Clinical correlation with patient history and  other diagnostic information is necessary to determine patient infection status.  The expected result is negative.  Fact Sheet for Patients:   RoadLapTop.co.za   Fact Sheet for Healthcare Providers:   http://kim-miller.com/    This test is not yet approved or cleared by the [place] FDA and  has been authorized for detection and/or diagnosis of SARS-CoV-2 by FDA under an Emergency Use Authorization (EUA).  This EUA will remain in effect (meaning this test can be used) for the duration of  the COVID-19 declaration under Section 564(b)(1)  of the Act, 21 U.S.C. section 360-bbb-3(b)(1), unless the authorization is terminated or revoked sooner.   Performed at [facility], [address] [place], [place] [address]   Urine Culture     Status: None   Collection Time: 04/12/22  4:00 PM   Specimen: In/Out Cath Urine  Result Value Ref Range Status   Specimen Description   Final    IN/OUT CATH URINE Performed  at [facility], [address], [place], [place] [address]    Special Requests   Final    NONE Performed at [facility], [address], [place], [place] [address]    Culture   Final    NO GROWTH Performed at [facility], [address], [place], [place] [address]    Report Status 04/14/2022 FINAL  Final  MRSA Next Gen by PCR, Nasal     Status: Abnormal   Collection Time: 04/13/22  1:15 AM   Specimen: Nasal Mucosa; Nasal Swab  Result Value Ref Range Status   MRSA by PCR Next Gen DETECTED (A) NOT DETECTED Final    Comment: RESULT CALLED TO, READ BACK BY AND VERIFIED WITH: ANN CARLTON, RN @ 0955 ON 08.27.2023 BY LAUREN BOWMAN,MLS (NOTE) The GeneXpert MRSA Assay (FDA approved for NASAL specimens only), is one component of a comprehensive MRSA colonization surveillance program. It is not intended to diagnose MRSA infection nor to guide or monitor treatment for MRSA infections. Test performance is not FDA approved in patients less than 50 years old. Performed at [facility], [address] [place], [place] [address]      Labs: BNP (last 3 results) No results for input(s): "BNP" in the last 8760 hours. Basic Metabolic Panel: Recent Labs  Lab 04/12/22 1500 04/13/22 0536 04/14/22 0602 04/15/22 0430  NA 131* 136 137 140  K 4.1 3.5 3.0* 4.1  CL 95* 103 103 104  CO2 GLUCOSE 125* 103* 83 156*  BUN 25*  CREATININE 1.57* 1.40* 1.71* 1.55*  CALCIUM 8.6* 8.4* 7.5* 8.1*  MG  --   --  1.8 2.0   Liver Function Tests: Recent Labs  Lab 04/12/22 1500 04/13/22 0536 04/14/22 0602 04/15/22 0430  AST 19 18 19 22   ALT 11 9 9 11   ALKPHOS 58 53 43 51  BILITOT 0.6 0.6 0.6 0.5  PROT 8.0 6.9 6.4* 7.8  ALBUMIN 3.4* 2.9* 2.7* 3.2*   No results for input(s): "LIPASE", "AMYLASE" in the last 168 hours. Recent Labs  Lab 04/13/22 1138  AMMONIA 16   CBC: Recent Labs  Lab 04/12/22 1500 04/13/22 0536 04/14/22 0602 04/15/22 0430  WBC 9.1 7.8 6.2 2.7*   NEUTROABS 7.3 5.6 3.7 1.9  HGB 10.4* 9.7* 9.0* 10.2*  HCT 34.2* 32.0* 30.0* 33.5*  MCV 94.2 94.4 95.8 93.8  PLT 263 217 200 200   Cardiac Enzymes: No results for input(s): "CKTOTAL", "CKMB", "CKMBINDEX", "TROPONINI" in the last 168 hours. BNP: Invalid input(s): "POCBNP" CBG: Recent Labs  Lab 04/14/22 1203 04/14/22 1711 04/14/22 2132 04/15/22 0731 04/15/22 1237  GLUCAP 93 120* 204* 206* 178*   D-Dimer Recent Labs    04/14/22 0602 04/15/22 0430  DDIMER 2.61* 2.48*   Hgb A1c No results for input(s): "HGBA1C" in the last 72 hours.  Lipid Profile No results for input(s): "CHOL", "HDL", "LDLCALC", "TRIG", "CHOLHDL", "LDLDIRECT" in the last 72 hours. Thyroid function studies Recent Labs    04/13/22 1138  TSH 0.748   Anemia work up Recent Labs    04/13/22 0536  FERRITIN 35   Urinalysis    Component Value Date/Time   COLORURINE YELLOW 04/12/2022 1600   APPEARANCEUR HAZY (A) 04/12/2022 1600   LABSPEC 1.010 04/12/2022 1600   PHURINE 6.0 04/12/2022 1600   GLUCOSEU NEGATIVE 04/12/2022 1600   HGBUR SMALL (A) 04/12/2022 1600   BILIRUBINUR NEGATIVE 04/12/2022 1600   KETONESUR NEGATIVE 04/12/2022 1600   PROTEINUR NEGATIVE 04/12/2022 1600   NITRITE NEGATIVE 04/12/2022 1600   LEUKOCYTESUR LARGE (A) 04/12/2022 1600   Sepsis Labs Recent Labs  Lab 04/12/22 1500 04/13/22 0536 04/14/22 0602 04/15/22 0430  WBC 9.1 7.8 6.2 2.7*   Microbiology Recent Results (from the past 240 hour(s))  Blood Culture (routine x 2)     Status: None (Preliminary result)   Collection Time: 04/12/22  3:00 PM   Specimen: BLOOD LEFT FOREARM  Result Value Ref Range Status   Specimen Description   Final    BLOOD LEFT FOREARM BOTTLES DRAWN AEROBIC AND ANAEROBIC   Special Requests   Final    Blood Culture results may not be optimal due to an excessive volume of blood received in culture bottles   Culture   Final    NO GROWTH 3 DAYS Performed at [facility], [address]  [place], [address] [place]    Report Status PENDING  Incomplete  Blood Culture (routine x 2)     Status: None (Preliminary result)   Collection Time: 04/12/22  3:00 PM   Specimen: Right Antecubital; Blood  Result Value Ref Range Status   Specimen Description   Final    RIGHT ANTECUBITAL BOTTLES DRAWN AEROBIC AND ANAEROBIC   Special Requests   Final    Blood Culture results may not be optimal due to an excessive volume of blood received in culture bottles   Culture   Final    NO GROWTH 3 DAYS Performed at [facility], [address], [place], [address] [place]    Report Status PENDING  Incomplete  SARS Coronavirus 2 by RT PCR (hospital order, performed in [facility] hospital lab) *cepheid single result test* Anterior Nasal Swab     Status: Abnormal   Collection Time: 04/12/22  3:10 PM  Specimen: Anterior Nasal Swab  Result Value Ref Range Status   SARS Coronavirus 2 by RT PCR POSITIVE (A) NEGATIVE Final    Comment: (NOTE) SARS-CoV-2 target nucleic acids are DETECTED  SARS-CoV-2 RNA is generally detectable in upper respiratory specimens  during the acute phase of infection.  Positive results are indicative  of the presence of the identified virus, but do not rule out bacterial infection or co-infection with other pathogens not detected by the test.  Clinical correlation with patient history and  other diagnostic information is necessary to determine patient infection status.  The expected result is negative.  Fact Sheet for Patients:   RoadLapTop.co.za   Fact Sheet for Healthcare Providers:   http://kim-miller.com/    This test is not yet approved or cleared by the [place] FDA and  has been authorized for detection and/or diagnosis of SARS-CoV-2 by FDA under an Emergency Use Authorization (EUA).  This EUA will remain in effect (meaning this test can be used) for the duration of  the COVID-19 declaration under Section 564(b)(1)  of  the Act, 21 U.S.C. section 360-bbb-3(b)(1), unless the authorization is terminated or revoked sooner.   Performed at [facility], [address], [place], [place] [address]   Urine Culture     Status: None   Collection Time: 04/12/22  4:00 PM   Specimen: In/Out Cath Urine  Result Value Ref Range Status   Specimen Description   Final    IN/OUT CATH URINE Performed at [facility], [address], [place], [place] [address]    Special Requests   Final    NONE Performed at [facility], [address], [place], [place] [address]    Culture   Final    NO GROWTH Performed at [facility], [address] [place], [place] [address]    Report Status 04/14/2022 FINAL  Final  MRSA Next Gen by PCR, Nasal     Status: Abnormal   Collection Time: 04/13/22  1:15 AM   Specimen: Nasal Mucosa; Nasal Swab  Result Value Ref Range Status   MRSA by PCR Next Gen DETECTED (A) NOT DETECTED Final    Comment: RESULT CALLED TO, READ BACK BY AND VERIFIED WITH: ANN CARLTON, RN @ 0955 ON 08.27.2023 BY LAUREN BOWMAN,MLS (NOTE) The GeneXpert MRSA Assay (FDA approved for NASAL specimens only), is one component of a comprehensive MRSA colonization surveillance program. It is not intended to diagnose MRSA infection nor to guide or monitor treatment for MRSA infections. Test performance is not FDA approved in patients less than 48 years old. Performed at [facility], [address] [place], [place] [address]      Time coordinating discharge: 35 minutes  SIGNED:   Erick Blinks, DO [facility] 04/15/2022, 4:52 PM  If 7PM-7AM, please contact night-coverage [email or web address]

## 2022-04-15 NOTE — Evaluation (Addendum)
Physical Therapy Evaluation Patient Details Name: Crystal Moses MRN: [id number] DOB: [birth year] Today's Date: 04/15/2022  History of Present Illness  Crystal Moses is a 66 y.o. female with medical history significant of diabetes, heart failure with preserved EF, hypertension.  Patient is a resident of [facility].  For the past day, the patient has been increased confused and with a fever.  Patient does have fairly frequent UTIs.  Patient complains of headache and pain in her legs.  Patient is otherwise unable to provide history and history is obtained by the chart.  She denies cough, shortness of breath.   Clinical Impression  Patient has difficulty rolling to the left side due to RUE weakness and pain with movement, required Mod/max assist for rolling side to side and sitting up from supine position.  Patient demonstrates fair sitting balance supporting self with BUE while seated at EOB, unable to stand due to BLE weakness and required Max assist to reposition when put back to bed.  Patient will benefit from continued skilled physical therapy in hospital and recommended venue below to increase strength, balance, endurance for safe ADLs.        Recommendations for follow up therapy are one component of a multi-disciplinary discharge planning process, led by the attending physician.  Recommendations may be updated based on patient status, additional functional criteria and insurance authorization.  Follow Up Recommendations Skilled nursing-short term rehab (<3 hours/day) Can patient physically be transported by private vehicle: No    Assistance Recommended at Discharge Set up Supervision/Assistance  Patient can return home with the following  A lot of help with bathing/dressing/bathroom;A lot of help with walking and/or transfers;Assistance with cooking/housework;Help with stairs or ramp for entrance    Equipment Recommendations None recommended by PT  Recommendations for Other Services        Functional Status Assessment Patient has had a recent decline in their functional status and/or demonstrates limited ability to make significant improvements in function in a reasonable and predictable amount of time     Precautions / Restrictions Precautions Precautions: Fall Restrictions Weight Bearing Restrictions: No      Mobility  Bed Mobility Overal bed mobility: Needs Assistance Bed Mobility: Rolling, Supine to Sit, Sit to Supine Rolling: Mod assist, Max assist   Supine to sit: Mod assist, Max assist Sit to supine: Max assist   General bed mobility comments: slow labored movement with limited use of RUE and LLE due to baseline weakness    Transfers                        Ambulation/Gait                  Stairs            Wheelchair Mobility    Modified Rankin (Stroke Patients Only)       Balance Overall balance assessment: Needs assistance Sitting-balance support: Feet supported, No upper extremity supported Sitting balance-Leahy Scale: Fair Sitting balance - Comments: seated at EOB                                     Pertinent Vitals/Pain Pain Assessment Pain Assessment: Faces Faces Pain Scale: Hurts even more Pain Location: RUE with movement pressure Pain Descriptors / Indicators: Grimacing, Guarding, Sore Pain Intervention(s): Limited activity within patient's tolerance, Monitored during session, Repositioned    Home Living Family/patient  expects to be discharged to:: Skilled nursing facility                        Prior Function Prior Level of Function : Needs assist       Physical Assist : Mobility (physical);ADLs (physical) Mobility (physical): Bed mobility;Transfers   Mobility Comments: Hoyar lift for transfers to wheelchair, able to sit up at bedside with assist, can propel self in wheechair, "per patient" ADLs Comments: assisted by SNF staff     Hand Dominance   Dominant Hand:  Right    Extremity/Trunk Assessment   Upper Extremity Assessment Upper Extremity Assessment: Generalized weakness;RUE deficits/detail RUE Deficits / Details: grossly -3/5 RUE: Unable to fully assess due to pain RUE Sensation: WNL RUE Coordination: WNL    Lower Extremity Assessment Lower Extremity Assessment: Generalized weakness;LLE deficits/detail LLE Deficits / Details: grossly 3-/5 LLE Sensation: WNL LLE Coordination: WNL    Cervical / Trunk Assessment Cervical / Trunk Assessment: Normal  Communication   Communication: No difficulties  Cognition Arousal/Alertness: Awake/alert Behavior During Therapy: WFL for tasks assessed/performed, Anxious Overall Cognitive Status: Within Functional Limits for tasks assessed                                          General Comments      Exercises     Assessment/Plan    PT Assessment Patient needs continued PT services  PT Problem List Decreased strength;Decreased activity tolerance;Decreased balance;Decreased mobility       PT Treatment Interventions DME instruction;Functional mobility training;Therapeutic activities;Therapeutic exercise;Patient/family education;Wheelchair mobility training;Balance training    PT Goals (Current goals can be found in the Care Plan section)  Acute Rehab PT Goals Patient Stated Goal: return  home with SNF staff to assist PT Goal Formulation: With patient Time For Goal Achievement: 04/29/22 Potential to Achieve Goals: Good    Frequency Min 2X/week     Co-evaluation               AM-PAC PT "6 Clicks" Mobility  Outcome Measure Help needed turning from your back to your side while in a flat bed without using bedrails?: A Lot Help needed moving from lying on your back to sitting on the side of a flat bed without using bedrails?: A Lot Help needed moving to and from a bed to a chair (including a wheelchair)?: Total Help needed standing up from a chair using your arms  (e.g., wheelchair or bedside chair)?: Total Help needed to walk in hospital room?: Total Help needed climbing 3-5 steps with a railing? : Total 6 Click Score: 8    End of Session   Activity Tolerance: Patient tolerated treatment well;Patient limited by fatigue;Patient limited by pain Patient left: in bed;with call bell/phone within reach Nurse Communication: Mobility status PT Visit Diagnosis: Unsteadiness on feet (R26.81);Other abnormalities of gait and mobility (R26.89);Muscle weakness (generalized) (M62.81)    Time: 7341-9379 PT Time Calculation (min) (ACUTE ONLY): 36 min   Charges:   PT Evaluation $PT Eval Moderate Complexity: 1 Mod PT Treatments $Therapeutic Activity: 23-37 mins        12:00 PM, 04/15/22 Ocie Bob, MPT Physical Therapist with [facility] [phone] office [phone] mobile phone

## 2022-04-15 NOTE — Care Management Important Message (Signed)
Important Message  Patient Details  Name: Crystal Moses MRN: [id number] Date of Birth: [birth year]   Medicare Important Message Given:  Yes (spoke with son Acadia Thammavong at [phone] to review letter, no additional copy needed per son)     Corey Harold 04/15/2022, 2:08 PM

## 2022-04-15 NOTE — Plan of Care (Signed)
  Problem: Acute Rehab PT Goals(only PT should resolve) Goal: Pt will Roll Supine to Side Outcome: Progressing Flowsheets (Taken 04/15/2022 1201) Pt will Roll Supine to Side: with mod assist Goal: Pt Will Go Supine/Side To Sit Outcome: Progressing Flowsheets (Taken 04/15/2022 1201) Pt will go Supine/Side to Sit: with moderate assist Goal: Pt Will Go Sit To Supine/Side Outcome: Progressing Flowsheets (Taken 04/15/2022 1201) Pt will go Sit to Supine/Side:  with moderate assist  with maximum assist Goal: Patient Will Perform Sitting Balance Outcome: Progressing Flowsheets (Taken 04/15/2022 1201) Patient will perform sitting balance:  with modified independence  with supervision   12:02 PM, 04/15/22 Ocie Bob, MPT Physical Therapist with [facility] [phone] office [phone] mobile phone

## 2022-04-15 NOTE — TOC Transition Note (Signed)
Transition of Care [facility]) - CM/SW Discharge Note   Patient Details  Name: Crystal Moses MRN: [id number] Date of Birth: 09-30-1955  [facility] CM/SW Contact:  Karn Cassis, LCSW Phone Number: 04/15/2022, 12:58 PM   Clinical Narrative:  Pt d/c today and will return to [facility]. Authorization started and okay to return while pending per admissions. D/C summary sent to SNF. Pt will transport via [facility]. RN given number to call report.      Final next level of care: Skilled Nursing Facility Barriers to Discharge: Barriers Resolved   Patient Goals and CMS Choice Patient states their goals for this hospitalization and ongoing recovery are:: get better      Discharge Placement                Patient to be transferred to facility by: [facility] Name of family member notified: son- Ivin Booty Patient and family notified of of transfer: 04/15/22  Discharge Plan and Services In-house Referral: Clinical Social Work                                   Social Determinants of Health (SDOH) Interventions     Readmission Risk Interventions     No data to display

## 2022-04-17 LAB — CULTURE, BLOOD (ROUTINE X 2)
Culture: NO GROWTH
Culture: NO GROWTH

## 2022-04-17 NOTE — Telephone Encounter (Signed)
I passed referral papers on to [facility]. Patient has been in the hospital again since the original papers came over for "jaundice". Discharged two days ago, was admitted with Covid. Her LFTs were normal the entire hospitalization. Discharged two days ago.

## 2022-04-25 NOTE — Telephone Encounter (Signed)
Received labs. No labs regarding jaundice sent. At this time, I would suggest calling and finding out if they still need GI consult. I have sent no supporting data inpatient recently and no records indicating elevated bilirubin from facility. If they have a GI reason to be seen, we can certainly see her but we would need to know what for.

## 2022-04-28 NOTE — Telephone Encounter (Signed)
Thank you Ann!

## 2022-04-28 NOTE — Telephone Encounter (Signed)
I spoke to Crystal Moses at [facility] and she said at this time we can cancel this referral, when they originally sent referral patient was jaundice but apparently they figured out what was wrong with patient and patient got better. Crystal Moses said she did call Friday and left a message to cancel referral

## 2022-05-06 NOTE — Progress Notes (Deleted)
Cardiology Office Note   Date:  05/06/2022   ID:  Crystal Moses, DOB 1956/06/08, MRN [id number]  PCP:  Charlynne Pander, MD    No chief complaint on file.    Wt Readings from Last 3 Encounters:  04/13/22 249 lb 1.9 oz (113 kg)  03/01/22 249 lb 1.9 oz (113 kg)  02/21/22 249 lb 9 oz (113.2 kg)       History of Present Illness: Crystal Moses is a 66 y.o. female  with CAD who I saw fro LE edema in 3/23.  3/23: ""with CAD s/p PCI >5 yrs, dCHF, HTN, OSA on CPAP, 100lbs weight loss in last year, Barrett's esophagus, depression, and anemia and DM who presented with fall and hip pain.   History of diabetes, high blood pressure, dementia living in a retirement faciliy.  D-I[facility] is on the phone to help with history.   She has LE edema for years and it became severe in Feb 2023.  She was hospitalized in [place] in February 2023. Per the patient: Blood pressure was very high and the leg swelling was very severe.  IV Lasix was given to help get rid of extra fluid.  She felt poorly after discharge.   IN the last few weeks, swelling has gotten better.  Torsemide has been more effective in relieving swelling.  This change from Lasix was made a few weeks ago. She also leg elevation.     She is trying to eat healthy."  Compression stockings recommended for her lower extremity swelling.  Past Medical History:  Diagnosis Date   Bilateral lower extremity edema    CAD (coronary artery disease)    CHF (congestive heart failure) (HCC)    Diabetes mellitus without complication (HCC)    Hyperlipidemia    Hypertension    SOB (shortness of breath)     Past Surgical History:  Procedure Laterality Date   APPENDECTOMY     APPLICATION OF WOUND VAC  02/12/2022   Procedure: APPLICATION OF WOUND VAC;  Surgeon: Nadara Mustard, MD;  Location: [facility] OR;  Service: Orthopedics;;   CERVICAL FUSION     CORONARY ANGIOPLASTY WITH STENT PLACEMENT     HERNIA REPAIR     I & D EXTREMITY Right 02/12/2022    Procedure: RIGHT SHOULDER DEBRIDEMENT WITH RESECTION OF HUMERAL HEAD;  Surgeon: Nadara Mustard, MD;  Location: [facility] OR;  Service: Orthopedics;  Laterality: Right;   I & D EXTREMITY Right 02/14/2022   Procedure: REPEAT DEBRIDEMENT RIGHT SHOULDER;  Surgeon: Nadara Mustard, MD;  Location: [facility] OR;  Service: Orthopedics;  Laterality: Right;   TEE WITHOUT CARDIOVERSION N/A 02/21/2022   Procedure: TRANSESOPHAGEAL ECHOCARDIOGRAM (TEE);  Surgeon: [name], MD;  Location: [facility] ENDOSCOPY;  Service: Cardiovascular;  Laterality: N/A;     Current Outpatient Medications  Medication Sig Dispense Refill   acetaminophen (TYLENOL) 500 MG tablet Take 1,000 mg by mouth every 12 (twelve) hours as needed for fever or moderate pain. >100.1. Not to exceed 3 grams in 24 hours.     ALPRAZolam (XANAX) 0.5 MG tablet Take 1 tablet (0.5 mg total) by mouth 2 (two) times daily. 10 tablet 0   ascorbic acid (VITAMIN C) 500 MG tablet Take 1 tablet (500 mg total) by mouth daily. 30 tablet 0   atorvastatin (LIPITOR) 80 MG tablet Take 80 mg by mouth at bedtime.     Calcium Carbonate (CALCIUM 500 PO) Take 500 mg by mouth in the morning and at  bedtime.     calcium carbonate (TUMS) 500 MG chewable tablet Chew 1 tablet by mouth every 8 (eight) hours as needed for heartburn.     diclofenac Sodium (VOLTAREN) 1 % GEL Apply 1 Application topically 4 (four) times daily.     divalproex (DEPAKOTE SPRINKLE) 125 MG capsule Take 250 mg by mouth 2 (two) times daily.     escitalopram (LEXAPRO) 20 MG tablet Take 20 mg by mouth daily.     folic acid (FOLVITE) 1 MG tablet Take 1 mg by mouth daily.     gabapentin (NEURONTIN) 300 MG capsule Take 300 mg by mouth 3 (three) times daily. 0900, 1300, 2100     ipratropium-albuterol (DUONEB) 0.5-2.5 (3) MG/3ML SOLN Inhale 3 mLs into the lungs every 6 (six) hours as needed (sob/wheezing).     lamoTRIgine (LAMICTAL) 25 MG tablet Take 25 mg by mouth 2 (two) times daily.     lidocaine (LIDODERM) 5 % Place 1 patch  onto the skin daily. Remove & Discard patch within 12 hours or as directed by MD     loperamide (IMODIUM A-D) 2 MG tablet Take 2 mg by mouth daily as needed for diarrhea or loose stools.     magnesium oxide (MAG-OX) 400 MG tablet Take 400 mg by mouth daily.     melatonin 3 MG TABS tablet Take 6 mg by mouth at bedtime.     meloxicam (MOBIC) 15 MG tablet Take 1 tablet (15 mg total) by mouth 2 (two) times daily as needed for pain.     metoprolol succinate (TOPROL-XL) 25 MG 24 hr tablet Take 1 tablet by mouth daily.     mirtazapine (REMERON) 7.5 MG tablet Take 7.5 mg by mouth at bedtime.     polyethylene glycol (MIRALAX / GLYCOLAX) 17 g packet Take 17 g by mouth daily as needed for moderate constipation.     Potassium Chloride ER 20 MEQ TBCR Take 1 tablet by mouth daily.     Probiotic Product (PROBIOTIC PO) Take 1 tablet by mouth in the morning and at bedtime.     sodium chloride (OCEAN) 0.65 % nasal spray Place 1 spray into the nose every 2 (two) hours as needed for congestion.     torsemide (DEMADEX) 20 MG tablet Take 40 mg by mouth in the morning.     traMADol (ULTRAM) 50 MG tablet Take 1 tablet (50 mg total) by mouth every 6 (six) hours as needed for moderate pain. 10 tablet 0   zinc sulfate 220 (50 Zn) MG capsule Take 1 capsule (220 mg total) by mouth daily. 30 capsule 0   No current facility-administered medications for this visit.    Allergies:   Patient has no known allergies.    Social History:  The patient  reports that she has quit smoking. She has never used smokeless tobacco. She reports current alcohol use. She reports that she does not currently use drugs.   Family History:  The patient's ***Family history is unknown by patient.    ROS:  Please see the history of present illness.   Otherwise, review of systems are positive for ***.   All other systems are reviewed and negative.    PHYSICAL EXAM: VS:  There were no vitals taken for this visit. , BMI There is no height or  weight on file to calculate BMI. GEN: Well nourished, well developed, in no acute distress HEENT: normal Neck: no JVD, carotid bruits, or masses Cardiac: ***RRR; no murmurs, rubs, or gallops,no  edema  Respiratory:  clear to auscultation bilaterally, normal work of breathing GI: soft, nontender, nondistended, + BS MS: no deformity or atrophy Skin: warm and dry, no rash Neuro:  Strength and sensation are intact Psych: euthymic mood, full affect   EKG:   The ekg ordered today demonstrates ***   Recent Labs: 04/13/2022: TSH 0.748 04/15/2022: ALT 11; BUN 25; Creatinine, Ser 1.55; Hemoglobin 10.2; Magnesium 2.0; Platelets 200; Potassium 4.1; Sodium 140   Lipid Panel No results found for: "CHOL", "TRIG", "HDL", "CHOLHDL", "VLDL", "LDLCALC", "LDLDIRECT"   Other studies Reviewed: Additional studies/ records that were reviewed today with results demonstrating: ***.   ASSESSMENT AND PLAN:  CHF/chronic diastolic heart failure:  CAD: DM:    Current medicines are reviewed at length with the patient today.  The patient concerns regarding her medicines were addressed.  The following changes have been made:  No change***  Labs/ tests ordered today include: *** No orders of the defined types were placed in this encounter.   Recommend 150 minutes/week of aerobic exercise Low fat, low carb, high fiber diet recommended  Disposition:   FU in ***   Signed, Lance Muss, MD  05/06/2022 1:45 PM    [facility] [address] [place], [place], [place]  [address] Phone: [phone]; Fax: [phone]

## 2022-05-07 ENCOUNTER — Ambulatory Visit: Payer: Medicare HMO | Admitting: Interventional Cardiology
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
## 2023-02-14 ENCOUNTER — Emergency Department
Admission: EM | Admit: 2023-02-14 | Discharge: 2023-02-15 | Disposition: A | Discharge status: Home / Self Care | Payer: Medicare Other | Attending: Emergency Medicine | Admitting: Emergency Medicine

## 2023-02-14 ENCOUNTER — Emergency Department

## 2023-02-14 ENCOUNTER — Other Ambulatory Visit: Payer: Self-pay

## 2023-02-14 DIAGNOSIS — E119 Type 2 diabetes mellitus without complications: Secondary | ICD-10-CM | POA: Insufficient documentation

## 2023-02-14 DIAGNOSIS — J189 Pneumonia, unspecified organism: Secondary | ICD-10-CM | POA: Diagnosis not present

## 2023-02-14 DIAGNOSIS — I110 Hypertensive heart disease with heart failure: Secondary | ICD-10-CM | POA: Insufficient documentation

## 2023-02-14 DIAGNOSIS — I2510 Atherosclerotic heart disease of native coronary artery without angina pectoris: Secondary | ICD-10-CM | POA: Diagnosis not present

## 2023-02-14 DIAGNOSIS — I509 Heart failure, unspecified: Secondary | ICD-10-CM | POA: Diagnosis not present

## 2023-02-14 DIAGNOSIS — R4182 Altered mental status, unspecified: Secondary | ICD-10-CM | POA: Diagnosis present

## 2023-02-14 LAB — CBC WITH DIFFERENTIAL/PLATELET
Abs Immature Granulocytes: 0.26 10*3/uL — ABNORMAL HIGH (ref 0.00–0.07)
Basophils Absolute: 0.2 10*3/uL — ABNORMAL HIGH (ref 0.0–0.1)
Basophils Relative: 1 %
Eosinophils Absolute: 0 10*3/uL (ref 0.0–0.5)
Eosinophils Relative: 0 %
HCT: 41.3 % (ref 36.0–46.0)
Hemoglobin: 11.7 g/dL — ABNORMAL LOW (ref 12.0–15.0)
Immature Granulocytes: 1 %
Lymphocytes Relative: 5 %
Lymphs Abs: 1.3 10*3/uL (ref 0.7–4.0)
MCH: 29.1 pg (ref 26.0–34.0)
MCHC: 28.3 g/dL — ABNORMAL LOW (ref 30.0–36.0)
MCV: 102.7 fL — ABNORMAL HIGH (ref 80.0–100.0)
Monocytes Absolute: 1.3 10*3/uL — ABNORMAL HIGH (ref 0.1–1.0)
Monocytes Relative: 5 %
Neutro Abs: 22.7 10*3/uL — ABNORMAL HIGH (ref 1.7–7.7)
Neutrophils Relative %: 88 %
Platelets: 306 10*3/uL (ref 150–400)
RBC: 4.02 MIL/uL (ref 3.87–5.11)
RDW: 16.1 % — ABNORMAL HIGH (ref 11.5–15.5)
Smear Review: NORMAL
WBC: 25.8 10*3/uL — ABNORMAL HIGH (ref 4.0–10.5)
nRBC: 0.2 % (ref 0.0–0.2)

## 2023-02-14 LAB — COMPREHENSIVE METABOLIC PANEL
ALT: 13 U/L (ref 0–44)
AST: 29 U/L (ref 15–41)
Albumin: 3.1 g/dL — ABNORMAL LOW (ref 3.5–5.0)
Alkaline Phosphatase: 83 U/L (ref 38–126)
Anion gap: 13 (ref 5–15)
BUN: 39 mg/dL — ABNORMAL HIGH (ref 8–23)
CO2: 24 mmol/L (ref 22–32)
Calcium: 8 mg/dL — ABNORMAL LOW (ref 8.9–10.3)
Chloride: 104 mmol/L (ref 98–111)
Creatinine, Ser: 2.01 mg/dL — ABNORMAL HIGH (ref 0.44–1.00)
GFR, Estimated: 27 mL/min — ABNORMAL LOW (ref >60)
Glucose, Bld: 197 mg/dL — ABNORMAL HIGH (ref 70–99)
Potassium: 5 mmol/L (ref 3.5–5.1)
Sodium: 141 mmol/L (ref 135–145)
Total Bilirubin: 0.6 mg/dL (ref 0.3–1.2)
Total Protein: 7.3 g/dL (ref 6.5–8.1)

## 2023-02-14 LAB — LACTIC ACID, PLASMA: Lactic Acid, Venous: 1.7 mmol/L (ref 0.5–1.9)

## 2023-02-14 MED ORDER — LEVOFLOXACIN 750 MG PO TABS: 750.0000 mg | ORAL_TABLET | Freq: Every day | ORAL | 0 refills | Status: AC

## 2023-02-14 MED ORDER — LEVOFLOXACIN IN D5W 750 MG/150ML IV SOLN
750.0000 mg | Freq: Once | INTRAVENOUS | Status: AC
  Administered 2023-02-14: 750 mg via INTRAVENOUS
  Filled 2023-02-14: qty 150

## 2023-02-14 NOTE — ED Provider Notes (Addendum)
[facility] Provider Note    Event Date/Time   First MD Initiated Contact with Patient 02/14/23 2114     (approximate)   History   Altered Mental Status   HPI  Crystal Moses is a 67 y.o. female   Past medical history of CHF, CAD, diabetes, hypertension, hyperlipidemia, presents emergency department with altered mental status.  She comes from a care facility on hospice with [facility] with accompanying paperwork DNR/DNI, dictating no antibiotics, comfort care only.  She is altered, disoriented, and not able to tell me much of the history or offer any complaints.  She states she "feels better" and smiles at me.  I spoke with her son Ivin Booty over the phone, he is listed as the patient representative on the goals of care paperwork, and he states that over the past several weeks his mother has been feeling not herself, more altered than usual, but reports from the facility that she is more fatigued, low blood pressure, had questionable urinary tract infection.  When I ask him about goals of care, he does maintain that she is DNR/DNI and wants to focus on comfort measures only and avoid hospitalizations, definitely avoid surgeries, but now would like her to get antibiotics in case she does have an infection but wants to limit interventions to that only.  Independent Historian contributed to assessment above: Ivin Booty son who I spoke with on the phone corroborates information and goals of care as above       Physical Exam   Triage Vital Signs: ED Triage Vitals  Enc Vitals Group     BP      Pulse      Resp      Temp      Temp src      SpO2      Weight      Height      Head Circumference      Peak Flow      Pain Score      Pain Loc      Pain Edu?      Excl. in GC?     Most recent vital signs: Vitals:   02/14/23 2127 02/14/23 2200  BP: (!) 93/41 99/80  Pulse: 91 92  Resp: 20 19  Temp: 98 F (36.7 C)   SpO2: 94% (!) 82%    General: Awake, no  distress.  CV:  Good peripheral perfusion.  Resp:  Normal effort.  Abd:  No distention.  Other:  She is borderline hypotensive 90s over 40s, afebrile, altered, but pleasant and conversant.  Abdomen is soft and nontender.  Distant lung sounds.   ED Results / Procedures / Treatments   Labs (all labs ordered are listed, but only abnormal results are displayed) Labs Reviewed  COMPREHENSIVE METABOLIC PANEL - Abnormal; Notable for the following components:      Result Value   Glucose, Bld 197 (*)    BUN 39 (*)    Creatinine, Ser 2.01 (*)    Calcium 8.0 (*)    Albumin 3.1 (*)    GFR, Estimated 27 (*)    All other components within normal limits  CBC WITH DIFFERENTIAL/PLATELET - Abnormal; Notable for the following components:   WBC 25.8 (*)    Hemoglobin 11.7 (*)    MCV 102.7 (*)    MCHC 28.3 (*)    RDW 16.1 (*)    Neutro Abs 22.7 (*)    Monocytes Absolute 1.3 (*)  Basophils Absolute 0.2 (*)    Abs Immature Granulocytes 0.26 (*)    All other components within normal limits  LACTIC ACID, PLASMA     I ordered and reviewed the above labs they are notable for marked leukocytosis  EKG  ED ECG REPORT I, Pilar Jarvis, the attending physician, personally viewed and interpreted this ECG.   Date: 02/14/2023  EKG Time: 2115  Rate: 94  Rhythm: sinus  Axis: nl  Intervals:rbbb  ST&T Change: no stemi    RADIOLOGY I independently reviewed and interpreted chest x-ray and I see multifocal opacities concerning for pneumonia   PROCEDURES:  Critical Care performed: No  Procedures   MEDICATIONS ORDERED IN ED: Medications  levofloxacin (LEVAQUIN) IVPB 750 mg (750 mg Intravenous New Bag/Given 02/14/23 2237)    IMPRESSION / MDM / ASSESSMENT AND PLAN / ED COURSE  I reviewed the triage vital signs and the nursing notes.                                Patient's presentation is most consistent with acute presentation with potential threat to life or bodily  function.  Differential diagnosis includes, but is not limited to, infection, stroke, ACS, dehydration   The patient is on the cardiac monitor to evaluate for evidence of arrhythmia and/or significant heart rate changes.  MDM:   This is a hospice patient with goals of care stating comfort measures only, no antibiotics, etc. however given her decline recently her son would like her to be evaluated to start on antibiotics in case those may alleviate some of her symptoms in the event that she does have an infection.   I explained to him given her chronic medical conditions that this subacute decline may represent a deterioration in her health leading to death, but that given her goals of care and focus on comfort measures, we will avoid escalation of care here and focus on comfort only and limit our interventions to detection of pneumonia or urinary tract infection and start her on antibiotics if an infection is identified, but ultimately discharged her from the hospital today back to her care facility to avoid hospitalization in accordance to her wishes  --She does have leukocytosis and multifocal pneumonia on chest x-ray, will start her on levofloxacin which should cover for any concurrent UTI as well.  I will spare her a catheterized urine sample, and discharged back to her facility per goals of care discussion above.  I informed her son Ivin Booty of these findings and plan.       FINAL CLINICAL IMPRESSION(S) / ED DIAGNOSES   Final diagnoses:  Altered mental status, unspecified altered mental status type  Multifocal pneumonia     Rx / DC Orders   ED Discharge Orders          Ordered    levofloxacin (LEVAQUIN) 750 MG tablet  Daily        02/14/23 2228    levofloxacin (LEVAQUIN) 750 MG tablet  Daily        02/14/23 2229             Note:  This document was prepared using Dragon voice recognition software and may include unintentional dictation errors.    Pilar Jarvis,  MD 02/14/23 2956    Pilar Jarvis, MD 02/14/23 2252

## 2023-02-14 NOTE — ED Notes (Signed)
Pt's oxygen kept decreasing with [place] on. Pt would fall asleep and mouth breathing.  Simple mask was found and placed on pt and pt is doing well.  RT was called, for possible high flow, but the pt mouth breaths and is doing well on the simple mask

## 2023-02-14 NOTE — Discharge Instructions (Signed)
Take levofloxacin for pneumonia for the full course as prescribed.

## 2023-02-14 NOTE — ED Triage Notes (Signed)
Pt BIB EMS from a facility. She is comfort care. According to EMS, pt has been having altered mental status, and pt's son wanted pt to come to the hospital.  Pt in with DNR/Comfort Care papers which specify no IVs and no antibiotics.  Dr [place] Charon in to evaluate the pt as well.  Attempting to call pt's son, to verify what he wants done and to obtain history on pt.

## 2023-02-15 NOTE — ED Notes (Signed)
[facility] called for transport to [facility]
